# Patient Record
Sex: Male | Born: 1965 | Race: White | Hispanic: No | Marital: Married | Smoking: Never smoker
Health system: Southern US, Community
[De-identification: ages and names within clinical notes are randomized; demographics above are authoritative.]

## PROBLEM LIST (undated history)

## (undated) DIAGNOSIS — J189 Pneumonia, unspecified organism: Secondary | ICD-10-CM

## (undated) DIAGNOSIS — I2510 Atherosclerotic heart disease of native coronary artery without angina pectoris: Secondary | ICD-10-CM

## (undated) DIAGNOSIS — Z9889 Other specified postprocedural states: Secondary | ICD-10-CM

## (undated) DIAGNOSIS — Z9861 Coronary angioplasty status: Secondary | ICD-10-CM

## (undated) DIAGNOSIS — I252 Old myocardial infarction: Secondary | ICD-10-CM

## (undated) DIAGNOSIS — I219 Acute myocardial infarction, unspecified: Secondary | ICD-10-CM

## (undated) DIAGNOSIS — I10 Essential (primary) hypertension: Secondary | ICD-10-CM

## (undated) DIAGNOSIS — K219 Gastro-esophageal reflux disease without esophagitis: Secondary | ICD-10-CM

## (undated) DIAGNOSIS — E785 Hyperlipidemia, unspecified: Secondary | ICD-10-CM

## (undated) DIAGNOSIS — R112 Nausea with vomiting, unspecified: Secondary | ICD-10-CM

## (undated) DIAGNOSIS — M1990 Unspecified osteoarthritis, unspecified site: Secondary | ICD-10-CM

## (undated) DIAGNOSIS — I119 Hypertensive heart disease without heart failure: Secondary | ICD-10-CM

## (undated) HISTORY — DX: Atherosclerotic heart disease of native coronary artery without angina pectoris: I25.10

## (undated) HISTORY — DX: Coronary angioplasty status: Z98.61

## (undated) HISTORY — PX: BACK SURGERY: SHX140

## (undated) HISTORY — PX: KNEE ARTHROSCOPY: SUR90

## (undated) HISTORY — PX: ROTATOR CUFF REPAIR: SHX139

## (undated) HISTORY — PX: OTHER SURGICAL HISTORY: SHX169

## (undated) HISTORY — DX: Old myocardial infarction: I25.2

## (undated) HISTORY — PX: CARPAL TUNNEL RELEASE: SHX101

---

## 1999-04-01 ENCOUNTER — Encounter: Admission: RE | Admit: 1999-04-01 | Discharge: 1999-04-16 | Payer: Self-pay | Admitting: Family Medicine

## 2001-11-18 ENCOUNTER — Encounter: Payer: Self-pay | Admitting: Specialist

## 2001-11-18 ENCOUNTER — Ambulatory Visit (HOSPITAL_COMMUNITY): Admission: RE | Admit: 2001-11-18 | Discharge: 2001-11-18 | Payer: Self-pay | Admitting: Specialist

## 2001-12-05 ENCOUNTER — Encounter: Payer: Self-pay | Admitting: Emergency Medicine

## 2001-12-05 ENCOUNTER — Emergency Department (HOSPITAL_COMMUNITY): Admission: EM | Admit: 2001-12-05 | Discharge: 2001-12-05 | Payer: Self-pay | Admitting: Emergency Medicine

## 2001-12-10 ENCOUNTER — Inpatient Hospital Stay (HOSPITAL_COMMUNITY): Admission: RE | Admit: 2001-12-10 | Discharge: 2001-12-11 | Payer: Self-pay | Admitting: Specialist

## 2001-12-10 ENCOUNTER — Encounter (INDEPENDENT_AMBULATORY_CARE_PROVIDER_SITE_OTHER): Payer: Self-pay | Admitting: *Deleted

## 2003-03-09 ENCOUNTER — Encounter: Admission: RE | Admit: 2003-03-09 | Discharge: 2003-03-09 | Payer: Self-pay | Admitting: Specialist

## 2003-05-22 ENCOUNTER — Inpatient Hospital Stay (HOSPITAL_COMMUNITY): Admission: RE | Admit: 2003-05-22 | Discharge: 2003-05-25 | Payer: Self-pay | Admitting: Specialist

## 2003-12-21 ENCOUNTER — Ambulatory Visit (HOSPITAL_COMMUNITY): Admission: RE | Admit: 2003-12-21 | Discharge: 2003-12-21 | Payer: Self-pay | Admitting: Specialist

## 2005-06-17 ENCOUNTER — Ambulatory Visit: Payer: Self-pay | Admitting: Cardiology

## 2006-01-16 ENCOUNTER — Ambulatory Visit (HOSPITAL_COMMUNITY): Admission: RE | Admit: 2006-01-16 | Discharge: 2006-01-16 | Payer: Self-pay | Admitting: Specialist

## 2008-01-19 ENCOUNTER — Ambulatory Visit (HOSPITAL_COMMUNITY): Admission: RE | Admit: 2008-01-19 | Discharge: 2008-01-19 | Payer: Self-pay | Admitting: Specialist

## 2010-07-26 NOTE — Discharge Summary (Signed)
NAMESANAD, [name]                        ACCOUNT NO.:  [id number]   MEDICAL RECORD NO.:  [id number]                   PATIENT TYPE:  INP   LOCATION:  5034                                 FACILITY:  [facility]   PHYSICIAN:  [name], M.D.                 DATE OF BIRTH:  08-Dec-1965   DATE OF ADMISSION:  DATE OF DISCHARGE:  05/25/2003                                 DISCHARGE SUMMARY   ADMISSION DIAGNOSIS:  Degenerative disk disease L4-5.   DISCHARGE DIAGNOSES:  Degenerative disk disease L4-5, status post  decompression with posterior lumbar interbody fusion and transverse lumbar  interbody fusion at L4-5 level.   PROCEDURE:  The patient was taken to the operating room on May 22, 2003 to  undergo decompression at the L4-5 level with TLIF and PLIF using  instrumentation.  Surgeon Dr. [name], assistant Roma Schanz,  P.A.-C and Dr. Kerrin Champagne, anesthesia general.  No complications.   CONSULTATIONS:  PT/OT.   HISTORY:  [name] is a 45 year old gentleman with a long-standing history  of low back pain extending from work related injury. The patient initially  underwent bilateral hemilaminotomies as well as microdissection in October  2003. The patient did well for sometime, then had recurrence of his low back  pain.  He was initially treated conservatively, however, continued to note  significant discomfort in the lumbar spine area.  A discogram was ordered  which was positive at the L4-5 level.  Due to the patient's continued pain,  and the fact that conservative therapy has failed, it was felt he would  benefit from a 1-level lumbar fusion at the L4-5 level. The risks and  benefits of the surgery were discussed with the patient and he wished to  proceed.   LABORATORY DATA:  A preoperative EKG showed normal sinus rhythm.  I do not  see a preop chest x-ray.  Preoperative WBC 6.3, hemoglobin 15.5, hematocrit  45.5. Routine H&H were followed throughout the  hospital course. At time of  discharge, hemoglobin was 12.0, hematocrit 35.5.  Routine chemistries done  preoperatively showed sodium 141, potassium 4.4 with glucose 83.  Routine  chemistries were followed throughout the hospital course.  The patient did  have slight hyponatremia and at time of discharge, sodium was 133, potassium  3.8, glucose 112, slightly decreased BUN at 4. Blood type A positive.   HOSPITAL COURSE:  The patient was taken to the operating room and underwent  the above-stated procedure without difficulty. One Hemovac drain was placed  at the time of surgery.  Patient was placed on PCA analgesics for pain  relief.  He was then transferred to the PACU, then to the orthopedic floor  for continued postoperative care.  Postoperatively, he did very well. He  noted significant improvement in his lower extremities pain and low back  pain as expected.   He did have slight temperature on postoperative day #  1 and incentive  spirometer was encouraged.  He also had slight hyponatremia which was  treated with changing fluids.  PT/OT was consulted.  Diet was held until  flatus. On postoperative day #2, the patient did very well. He was voiding  without difficulty. Pain was controlled on p.o. analgesics.  He had good  bowel sounds, vital signs were stable, he was afebrile.  Hemovac drain was  removed, diet was advanced.  On postoperative day #3, the patient was doing  well, requesting discharge to home.  Abdomen remained soft, nontender,  nondistended, calves were soft without evidence of DVT.  Incision was clean  and dry. Motor and neurovascular function remained intact.  At this point,  it was felt the patient could be discharged home with all home health needs  met.   DISPOSITION:  The patient was discharged to home  with home health physical  therapy.   FOLLOW UP:  He should follow up with Dr. Shelle [name] in approximately 10-14 days,  call office for appointment.   WOUND CARE:  He  is to change his dressing on a daily basis, okay to shower.  Notify us if he notes any fever or erythema with excess drainage in this  area.   ACTIVITY:  He should follow back precautions as instructed by physical  therapy.  Walk as tolerated.   DISCHARGE MEDICATIONS:  Percocet as needed for pain relief, Robaxin as  needed for muscle spasm, Colace for stool softener.   CONDITION ON DISCHARGE:  Stable.  Patient doing well, status post  decompression at the L4-5 level with TLIF and PLIF with instrumentation.      Roma Schanz, P.A.                   [name], M.D.    CS/MEDQ  D:  06/23/2003  T:  06/24/2003  Job:  [id number]

## 2010-07-26 NOTE — Op Note (Signed)
NAMEJOHNELL, BAS                        ACCOUNT NO.:  [id number]   MEDICAL RECORD NO.:  [id number]                   PATIENT TYPE:  INP   LOCATION:  2899                                 FACILITY:  [facility]   PHYSICIAN:  [name], M.D.                 DATE OF BIRTH:  03/12/1965   DATE OF PROCEDURE:  05/22/2003  DATE OF DISCHARGE:                                 OPERATIVE REPORT   PREOPERATIVE DIAGNOSIS:  Recurrent disc herniation and degenerative disc  disease, L4-L5.   POSTOPERATIVE DIAGNOSIS:  Recurrent disc herniation and degenerative disc  disease, L4-L5.   PROCEDURE PERFORMED:  Hemilaminectomy L4-L5 on the right, with redo  microdiscectomy L4-L5, right, foraminotomies L4 and L5, TLIF L4-L5 utilizing  Monarch cage, autologous, and Symphony bone graft, pedicle screw  instrumentation L4-L5 with a lateral posterolateral fusion L4-L5 utilizing  autologous and Symphony bone graft, interoperative continuous neural  monitoring for four hours, interoperative EMG testing of the pedicle screw  placement.   ANESTHESIA:  General.   ASSISTANT:  Roma Schanz, P.A.  Kerrin Champagne, M.D.   BRIEF HISTORY AND INDICATIONS:  45 year old with end stage disc degeneration  at L4-L5 refractory to conservative treatment, positive diskography,  disabling pain.  Operative intervention was indication for decompression and  fusion.  The risks and benefits were discussed including bleeding,  infection, damage to neurovascular structures, CSF leak, epidural fibrosis,  adjacent segment disease, nonunion, etc.   SURGICAL TECHNIQUE:  With the patient in the supine position, after adequate  general anesthesia and 1 gram Kefzol, he was placed prone on the Wilson  frame upright in a flexed position.  The lumbar region was prepped and  draped in the usual sterile fashion.  The previous surgical incision was  utilized.  An incision was made from the spinous process of L3 to below L5.  The  subcutaneous tissue were dissected, electrocautery was utilized to  achieve hemostasis.  Kochers were placed in the spinous process of 3 and 4  and then 4 and 5 to confirm the disc space.  I then performed paraspinous  muscle elevation of 4 and 5 on the right.  A McCullough retractor was then  placed.  I utilizing a high speed bur to decorticate the bone and then an  osteotome to complete the Gill laminectomy for exposure for the TLIF.  We  then removed the remaining superior articular portion of the 4-5 facet to  flush with the pedicle.  I then decompressed medially using 2 and 3 mm  Kerrison removing the intervening ligamentum flavum creating a significant  decompression in the lateral recess and degenerative disc and posterior  osteophytic spurring.  After mobilization of the thecal sac and  identification of the 4 root which was protected at all times and  visualization with the microscope, we entered the disc space at the  annulotomy and dilated it to 10 mm.  This was  done under x-ray.  We then  curetted the endplates, performed a thorough and full discectomy using C-arm  for guidance.  After the full discectomy and curetting of the endplates, I  then felt that a 9 mm cage would be appropriate.  I then used bone graft  that was taken from the facet and from the Symphony bone graft.  I packed it  anteriorly in front of the vertebral bodies at 4-5 and selected a 9 mm cage,  placed the bone in the cage, and impacted it with neural elements protecting  it at all times, kicked it over with satisfactory AP and lateral plane with  distraction of the disc space.  A hockey stick probe was placed in the  foramen of 4 and 5 and found it widely patent.  Following this, we copiously  irrigated, bipolar electrocautery was utilized to achieve hemostasis.  I  closed the fascia in the midline temporarily.   I made a paraspinous incision through a separate fascial incision  bilaterally for the approach  of the pedicle screws and used the pedicle  screw instrumentation with the path finder technique.  We used the Jamshidi  needle engaging the TP and the lateral border of the pedicle.  With the  appropriate version using an x-ray, we impacted it into the vertebral body  at 4 and 5 using the identical technique making sure we did not go medial to  the medial border of the pedicle before we were into the vertebral body of  the lateral.  Following this, a guide-wire was placed.  Again, using  multiple C-arm images to insure that the guide-wire was not advanced.  After  the guide-wire, we then sequentially dilated and then used an awl in the  appropriate version.  The guide pin was there.  We selected 35 mm lag screws  at 5 and at 4.  We tapped and with the taps in each of the screw holes, we  tested the EMG monitoring and it was all greater than 23 milliamps.  We  inserted the screws first of all in the right at 4 and 5 in the appropriate  conversions.  Excellent purchase after tapping.  Then, in a similar fashion,  we inserted the screws on the right at appropriate conversion on AP and  appropriate orientation in the lateral.  We then used the path finder  technique and on the left, with the devices, moved the intervening muscle  between the two screws, measured the rod length 50 mm length, inserted that,  placed the end caps upon that on both sides.  We then compressed on the  right side over the cage under x-ray and placed the end caps, torqued them  off, and then we compressed on the other side and torqued them off, as well,  after placement of the end caps.  We had excellent purchase in the AP and  lateral plane, good length of the rods.  The wounds were copiously  irrigated.  I then examined it centrally and hockey stick probe was placed  in the foramina of 4 and 5 and found it widely patent.  The screws were in the pedicle.  There was no active bleeding.  Thrombin soaked Gelfoam was   placed in the laminotomy defect.  Copious irrigation was utilized.   Next, on the posterolateral fusion on the left, inserted the dilators down  of the transverse processes over the right, curetted the transverse  processes, and we had the  lateral aspect of the facet and then placed bone  graft through that and delivered it into the bed.  Next, the wound was  copiously irrigated and closed the separate fascial incisions with #1  Vicryl.  There was no significant abnormality on the continuous nerve  monitoring, as well.  We tested all screws prior to replacement of the rod  and they were all, again, greater than 23 milliamps.  Next, the wound was  copiously irrigated.  We placed a Hemovac and brought out through a lateral  stab wound in the skin in the subfascial area.  We repaired the central  subfascial with #1 Vicryl in figure-of-eight sutures, the subcutaneous  tissues were reapproximated with 0 and 2-0 Vicryl simple sutures, the skin  was reapproximated with staples.  The wounds were dressed sterilely, he was  placed supine on the operative bed, extubated without difficulty, and  transported to the recovery room in satisfactory condition.  The patient  tolerated the procedure well with no complications.  Blood loss 200 mL.                                               [name], M.D.    Cordelia Pen  D:  05/22/2003  T:  05/22/2003  Job:  [id number]

## 2010-07-26 NOTE — Op Note (Signed)
[name]                        ACCOUNT NO.:  [id number]   MEDICAL RECORD NO.:  [id number]                   PATIENT TYPE:  INP   LOCATION:  0469                                 FACILITY:  [facility]   PHYSICIAN:  Javier Docker, M.D.              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   PROCEDURE:  Bilateral hemilaminotomy, lateral recess decompression,  microdiskectomy.   ANESTHESIA:  General.   ASSISTANT:  Roma Schanz, P.A.   BRIEF HISTORY:  A 45 year old with bilateral refractory L5 radicular pain  secondary to HNP at L4-5, lateral recess stenosis, degenerative disk disease  at 4-5. Operative intervention was indicated for decompression of the 5 root  bilaterally, __________ microdiskectomy, lateral recess decompression. The  risks and benefits were discussed including bleeding, infection, injury to  neurovascular structures, CSF leakage, epidural fibrosis,  recurrence of  symptoms, etc.   TECHNIQUE:  The patient in the supine position after an adequate level of  general anesthesia and 1 gm of Kefzol, placed prone on the Andrews frame,  all bony prominences well padded, lumbar region was prepped and draped in  the usual sterile fashion. Two 18 gauge spinal needles were utilized to  localize the 4-5 interspace, confirmed with x-ray. An incision was made from  the spinous process of 4 to 5, subcutaneous tissue dissected, electrocautery  utilized to achieve hemostasis. The dorsal lumbar fascia identified and  divided along the outer line of the interspinous ligament. The paraspinous  muscle was elevated from the lamina of 4  and 5 bilaterally and McCullough  retractor was placed. Prior to this, Kochers placed in the spinous process  of 4, a Penfield 4 in the intralaminar space confirmed with x-ray. After the  [facility] retractor was placed, the operating microscope was draped and  brought onto the surgical field. The ligamentum flavum was detached from the  cephalad edge of 4 bilaterally. Hemilaminotomy was performed over the caudad  edge of 5. The neural patties were placed beneath the ligamentum flavum and  the ligamentum flavum was removed bilaterally. There was significant lateral  recess stenosis noted. Foraminotomies at L5 were performed. The L5 nerve  root was gently mobilized medially first on the left, decompressing the  lateral recess fully. There was extensive epidural venous plexus and bipolar  electrocautery was utilized to achieve hemostasis on the right. I gently  mobilized the L5 nerve root medially. There was significant tension on the  root noted secondary to disk herniation. After decompression of the lateral  recess had been performed, electrocautery was achieved with strict  hemostasis. Annulotomy was performed, a copious portion  of disk material was  removed from the disk space. There were multiple fragments noted in the  __________ disk space. I also entered the disk space on the left due to the  bilateral symptoms. All of the fragments were removed as well. I mobilized  across the midline with an Epstein and a hockey stick. I removed disk  material with straight and upbiting pituitary curettes. The disk space was  copiously irrigated and we inspected both sides with no evidence of residual  disk material noted. There was a prominent osteophytic spur at the disk  space of the vertebral body of 4 and of 5 somewhat prominent but after the  decompression there was no evidence of neural compression. The 5 roots were  freed bilaterally with over 1 cm of excursion medial to the pedicle. The   disk space was copiously irrigated, inspection revealed no CSF leakage or  active bleeding. McCullough retractor was removed, paraspinous muscles  inspected with no evidence of active bleeding. The dorsolumbar fascia was  reapproximated with #1 Vicryl interrupted figure-of-eight sutures. The  subcutaneous tissue reapproximated with 2-0 Vicryl simple sutures, skin was  reapproximated with 4-0 subcuticular Prolene and was reinforced with Steri-  Strips. A sterile dressing applied, placed supine on the hospital bed,  extubated without difficulty and transported to the recovery room in  satisfactory condition.   The patient tolerated the procedure well with no complications.                                               Javier Docker, M.D.    JCB/MEDQ  D:  12/10/2001  T:  12/10/2001  Job:  [id number]

## 2010-07-26 NOTE — H&P (Signed)
NAME:  [name], [name] NO.:  [id number]   MEDICAL RECORD NO.:  [id number]                    PATIENT TYPE:   LOCATION:                                       FACILITY:   PHYSICIAN:  Javier Docker, M.D.              DATE OF BIRTH:   DATE OF ADMISSION:  12/10/2001  DATE OF DISCHARGE:                                HISTORY & PHYSICAL   CHIEF COMPLAINT:  Low back pain, left buttock and posterior thigh pain.   HISTORY OF PRESENT ILLNESS:  This is a 45 year old male who, on July 05, 2001, was moving some machinery at work and pulled a muscle in his back.  He  had acute pain.  He was seen in the emergency room and then at [facility].  He was put on prednisone which gave him some  relief but now has persisted.  It shoots down his legs when he bends over  and radiates into his right buttock and legs.  It is worse with sitting,  coughing, and sneezing.  Lying down reduces the pain.  The patient denies  fevers, chills, change in bowel or bladder function, pain that awakens him  at night or unexplained recent weight loss.   PAST MEDICAL HISTORY:  The patient denies any past medical history.   PAST SURGICAL HISTORY:  Left knee scope.   MEDICATION:  Clonazepam 0.5 mg one p.o. q.h.s.   ALLERGIES:  No known drug allergies.   SOCIAL HISTORY:  The patient denies tobacco or alcohol use.  He lives in a  [place] house with no stairs.   FAMILY HISTORY:  Father with coronary artery disease, hypertension, and  diabetes mellitus.  Mother with hypertension and stroke.   REVIEW OF SYSTEMS:  GENERAL:  Denies fevers, chills, night sweats, bleeding  tendencies. CNS:  Positive headaches.  Denies blurry or double vision,  seizures, or paralysis.  RESPIRATORY:  Denies shortness of breath,  productive cough, hemoptysis.  CARDIOVASCULAR:  Denies chest pain, angina,  orthopnea.  GI:  Denies nausea, vomiting, diarrhea, constipation, melena, or  bloody stools.  GU:  Denies dysuria, hematuria or discharge.  MUSCULOSKELETAL:  Pertinent to HPI.   PHYSICAL EXAMINATION:  GENERAL APPEARANCE:  A well-developed, well-nourished  45 year old male.  VITAL SIGNS:  Blood pressure 134/84, pulse; 80, respiratory rate 168.  HEENT:  Normocephalic and atraumatic.  Pupils are equal, round and reactive  to light.  NECK:  Supple with no carotid bruit noted.  CHEST:  Clear to auscultation bilaterally.  No wheezes or crackles.  CARDIOVASCULAR:  Regular rate and rhythm with no murmurs, rubs, or gallops.  ABDOMEN:  Soft, nontender and nondistended with positive bowel sounds in all  four quadrants.  EXTREMITIES:  He walks with an antalgic gait, straight leg raising on the  right produces marked buttock, posterior thigh and calf pain.  EHL is 5/5  bilaterally.  Contralateral straight leg raising produces buttock pain.  He  is unable to heel walk on the left side.  He reports sensation is intact.  No flank pain with percussion.  Nontender over thoracic spine.  SKIN:  No rashes or lesions.   LABORATORY DATA:  MRI reveals L4-5 moderate disc degeneration with mild disc  space narrowing and adjacent end plate degenerative changes, moderate bulge  superimposed upon such a small, slightly moderate size posterolateral disc  protrusion with impression upon the right ventral aspect of the thecal sac.   IMPRESSION:  Herniated nucleus pulposus L4-5.   PLAN:  The patient will be admitted to [facility] on December 10, 2001, and undergo a bilateral lumbar decompression at L4-5 by Dr. [name].      Clarene Reamer, P.A.-C.                   Javier Docker, M.D.    SW/MEDQ  D:  12/07/2001  T:  12/07/2001  Job:  [id number]

## 2010-07-26 NOTE — H&P (Signed)
NAMEGUSTAVUS, [name]                        ACCOUNT NO.:  [id number]   MEDICAL RECORD NO.:  [id number]                   PATIENT TYPE:  INP   LOCATION:  NA                                   FACILITY:  [facility]   PHYSICIAN:  [name], M.D.                 DATE OF BIRTH:  May 18, 1965   DATE OF ADMISSION:  05/22/2003  DATE OF DISCHARGE:                                HISTORY & PHYSICAL   CHIEF COMPLAINT:  Low back and right lower extremity pain.   HISTORY:  Mr. [name] is a 45 year old gentleman who is a longstanding  patient of Dr. Shelle Iron.  He was initially seen for work-related low back  injury. The patient underwent bilateral hemilaminotomy as well as  microdiskectomy in October 2003.  The patient did well for quite some time;  however, he continued to complain of low back issues.  Initially  conservative treatment was initiated; however, he failed this.  A diskogram  was ordered which is positive at the L4-5 level.  The patient describes his  pain at this point as disabling.  It was felt he would benefit from a one-  level fusion at the L4-5 level.  The risks and benefits of the surgery were  discussed with him by Dr. Shelle Iron. At this point, he wishes to proceed.   PAST MEDICAL HISTORY:  Noncontributory.   CURRENT MEDICATIONS:  None.   ALLERGIES:  No known drug allergies.   PAST SURGICAL HISTORY:  1. Left knee arthroscopy.  2. Microdiskectomy.  3. Hemilaminotomy at L4-5 level.   SOCIAL HISTORY:  The patient is married.  He denies any tobacco or alcohol  intake.  He has three children and lives in a single-story home.   FAMILY HISTORY:  Father has history of coronary artery disease and diabetes,  mother history of CVA.   REVIEW OF SYSTEMS:  GENERAL:  The patient denies any fevers, chills, night  sweats, or bleeding tendencies.  CNS:  No blurred or double vision, seizure,  headache, or paralysis.  RESPIRATORY:  No shortness of breath, productive  cough, or hemoptysis.   CARDIOVASCULAR:  No chest pain, angina, or orthopnea.  GU:  No dysuria, hematuria, or discharge.  GI:  No nausea, vomiting,  diarrhea, constipation, melena, bloody stools.  MUSCULOSKELETAL:  Pertinent  as described.   PHYSICAL EXAMINATION:  VITAL SIGNS:  Pulse 76, respiratory rate 16, blood  pressure 140/98.  GENERAL:  Well-developed, well-nourished, 45 year old gentleman in mild  distress.  He does walk with slightly antalgic gait.  HEENT:  Atraumatic and normocephalic.  Pupils equal, round, and reactive to  light.  EOM intact.  NECK:  Supple.  No lymphadenopathy.  CHEST:  Clear to auscultation bilaterally.  No rhonchi, wheezes, or rales.  BREASTS/GU:  Not examined, no pertinent.  HEART:  Regular rate and rhythm without murmurs, gallops, or rubs.  ABDOMEN:  Soft, nontender, nondistended.  Bowel sounds x 4.  SKIN:  No rashes or lesions are noted.  MUSCULOSKELETAL:  Significantly decreased range of motion of the lumbar  spine.  Positive straight leg raise bilaterally.   IMPRESSION:  Degenerative disk disease L4-5.   PLAN:  The patient will be admitted to [facility] to undergo PLIF with  posterolateral fusion using pedicle screw instrumentation at the L4-5 level      Roma Schanz, P.A.                   [name], M.D.    CS/MEDQ  D:  05/18/2003  T:  05/19/2003  Job:  [id number]

## 2015-07-27 ENCOUNTER — Ambulatory Visit (HOSPITAL_COMMUNITY)
Admission: RE | Admit: 2015-07-27 | Discharge: 2015-07-27 | Disposition: A | Discharge status: Home / Self Care | Payer: Self-pay | Source: Ambulatory Visit | Attending: Specialist | Admitting: Specialist

## 2015-07-27 ENCOUNTER — Other Ambulatory Visit (HOSPITAL_COMMUNITY): Payer: Self-pay | Admitting: Specialist

## 2015-07-27 DIAGNOSIS — Z01810 Encounter for preprocedural cardiovascular examination: Secondary | ICD-10-CM

## 2015-07-27 DIAGNOSIS — Z01818 Encounter for other preprocedural examination: Secondary | ICD-10-CM | POA: Insufficient documentation

## 2015-09-16 ENCOUNTER — Inpatient Hospital Stay (HOSPITAL_COMMUNITY)
Admission: EM | Admit: 2015-09-16 | Discharge: 2015-09-18 | DRG: 247 | Disposition: A | Discharge status: Home / Self Care | Payer: BLUE CROSS/BLUE SHIELD | Attending: Cardiology | Admitting: Cardiology

## 2015-09-16 ENCOUNTER — Emergency Department (HOSPITAL_COMMUNITY): Payer: BLUE CROSS/BLUE SHIELD

## 2015-09-16 ENCOUNTER — Encounter (HOSPITAL_COMMUNITY): Payer: Self-pay | Admitting: Emergency Medicine

## 2015-09-16 DIAGNOSIS — E785 Hyperlipidemia, unspecified: Secondary | ICD-10-CM

## 2015-09-16 DIAGNOSIS — I214 Non-ST elevation (NSTEMI) myocardial infarction: Secondary | ICD-10-CM | POA: Diagnosis present

## 2015-09-16 DIAGNOSIS — K219 Gastro-esophageal reflux disease without esophagitis: Secondary | ICD-10-CM | POA: Diagnosis present

## 2015-09-16 DIAGNOSIS — Z955 Presence of coronary angioplasty implant and graft: Secondary | ICD-10-CM | POA: Diagnosis not present

## 2015-09-16 DIAGNOSIS — I200 Unstable angina: Secondary | ICD-10-CM | POA: Diagnosis present

## 2015-09-16 DIAGNOSIS — R079 Chest pain, unspecified: Secondary | ICD-10-CM | POA: Diagnosis present

## 2015-09-16 DIAGNOSIS — I25110 Atherosclerotic heart disease of native coronary artery with unstable angina pectoris: Secondary | ICD-10-CM | POA: Diagnosis present

## 2015-09-16 DIAGNOSIS — I119 Hypertensive heart disease without heart failure: Secondary | ICD-10-CM

## 2015-09-16 HISTORY — DX: Hypertensive heart disease without heart failure: I11.9

## 2015-09-16 HISTORY — DX: Essential (primary) hypertension: I10

## 2015-09-16 HISTORY — DX: Hyperlipidemia, unspecified: E78.5

## 2015-09-16 HISTORY — DX: Gastro-esophageal reflux disease without esophagitis: K21.9

## 2015-09-16 LAB — BASIC METABOLIC PANEL
Anion gap: 6 (ref 5–15)
BUN: 16 mg/dL (ref 6–20)
CO2: 27 mmol/L (ref 22–32)
Calcium: 9.2 mg/dL (ref 8.9–10.3)
Chloride: 108 mmol/L (ref 101–111)
Creatinine, Ser: 1.02 mg/dL (ref 0.61–1.24)
GFR calc Af Amer: >60 mL/min (ref >60)
GFR calc non Af Amer: >60 mL/min (ref >60)
Glucose, Bld: 106 mg/dL — ABNORMAL HIGH (ref 65–99)
Potassium: 3.8 mmol/L (ref 3.5–5.1)
Sodium: 141 mmol/L (ref 135–145)

## 2015-09-16 LAB — TROPONIN I
TROPONIN I: 0.98 ng/mL — AB (ref <0.03)
Troponin I: 0.03 ng/mL (ref <0.03)

## 2015-09-16 LAB — CBC
HCT: 43 % (ref 39.0–52.0)
Hemoglobin: 14.6 g/dL (ref 13.0–17.0)
MCH: 29.4 pg (ref 26.0–34.0)
MCHC: 34 g/dL (ref 30.0–36.0)
MCV: 86.7 fL (ref 78.0–100.0)
Platelets: 276 10*3/uL (ref 150–400)
RBC: 4.96 MIL/uL (ref 4.22–5.81)
RDW: 13 % (ref 11.5–15.5)
WBC: 6.6 10*3/uL (ref 4.0–10.5)

## 2015-09-16 MED ORDER — ATORVASTATIN CALCIUM 80 MG PO TABS: 80.0000 mg | ORAL_TABLET | Freq: Every day | ORAL | Status: DC

## 2015-09-16 MED ORDER — ACETAMINOPHEN 325 MG PO TABS
650.0000 mg | ORAL_TABLET | ORAL | Status: DC | PRN
  Administered 2015-09-16 – 2015-09-17 (×2): 650 mg via ORAL
  Filled 2015-09-16 (×2): qty 2

## 2015-09-16 MED ORDER — METOPROLOL TARTRATE 12.5 MG HALF TABLET
12.5000 mg | ORAL_TABLET | Freq: Two times a day (BID) | ORAL | Status: DC
  Administered 2015-09-17 – 2015-09-18 (×3): 12.5 mg via ORAL
  Filled 2015-09-16 (×3): qty 1

## 2015-09-16 MED ORDER — NITROGLYCERIN 2 % TD OINT
1.0000 [in_us] | TOPICAL_OINTMENT | Freq: Once | TRANSDERMAL | Status: AC
  Administered 2015-09-16: 1 [in_us] via TOPICAL
  Filled 2015-09-16: qty 1

## 2015-09-16 MED ORDER — CLOPIDOGREL BISULFATE 75 MG PO TABS
300.0000 mg | ORAL_TABLET | Freq: Once | ORAL | Status: AC
  Administered 2015-09-16: 300 mg via ORAL
  Filled 2015-09-16: qty 4

## 2015-09-16 MED ORDER — ONDANSETRON HCL 4 MG/2ML IJ SOLN: 4.0000 mg | Freq: Four times a day (QID) | INTRAMUSCULAR | Status: DC | PRN

## 2015-09-16 MED ORDER — NITROGLYCERIN 0.4 MG SL SUBL
0.4000 mg | SUBLINGUAL_TABLET | SUBLINGUAL | Status: AC | PRN
  Administered 2015-09-16 (×3): 0.4 mg via SUBLINGUAL
  Filled 2015-09-16: qty 1

## 2015-09-16 MED ORDER — NITROGLYCERIN 2 % TD OINT
1.0000 [in_us] | TOPICAL_OINTMENT | Freq: Three times a day (TID) | TRANSDERMAL | Status: DC
  Filled 2015-09-16: qty 30

## 2015-09-16 MED ORDER — NITROGLYCERIN 0.4 MG SL SUBL
0.4000 mg | SUBLINGUAL_TABLET | SUBLINGUAL | Status: DC | PRN
Start: 2015-09-16 — End: 2015-09-18

## 2015-09-16 MED ORDER — CLOPIDOGREL BISULFATE 75 MG PO TABS
75.0000 mg | ORAL_TABLET | Freq: Every day | ORAL | Status: DC
  Administered 2015-09-17: 75 mg via ORAL
  Filled 2015-09-16: qty 1

## 2015-09-16 MED ORDER — ASPIRIN 81 MG PO CHEW
324.0000 mg | CHEWABLE_TABLET | Freq: Once | ORAL | Status: AC
Start: 2015-09-16 — End: 2015-09-16
  Administered 2015-09-16: 324 mg via ORAL
  Filled 2015-09-16: qty 4

## 2015-09-16 MED ORDER — HEPARIN BOLUS VIA INFUSION
4000.0000 [IU] | Freq: Once | INTRAVENOUS | Status: AC
  Administered 2015-09-16: 4000 [IU] via INTRAVENOUS
  Filled 2015-09-16: qty 4000

## 2015-09-16 MED ORDER — ASPIRIN EC 81 MG PO TBEC
81.0000 mg | DELAYED_RELEASE_TABLET | Freq: Every day | ORAL | Status: DC
  Administered 2015-09-17 – 2015-09-18 (×2): 81 mg via ORAL
  Filled 2015-09-16 (×2): qty 1

## 2015-09-16 MED ORDER — HEPARIN (PORCINE) IN NACL 100-0.45 UNIT/ML-% IJ SOLN
1000.0000 [IU]/h | INTRAMUSCULAR | Status: DC
  Administered 2015-09-16: 1000 [IU]/h via INTRAVENOUS
  Filled 2015-09-16: qty 250

## 2015-09-16 MED ORDER — PANTOPRAZOLE SODIUM 40 MG PO TBEC
40.0000 mg | DELAYED_RELEASE_TABLET | Freq: Every day | ORAL | Status: DC
  Administered 2015-09-17 – 2015-09-18 (×2): 40 mg via ORAL
  Filled 2015-09-16 (×2): qty 1

## 2015-09-16 NOTE — ED Provider Notes (Signed)
CSN: [id number]     Arrival date & time 09/16/15  1634 History   First MD Initiated Contact with Patient 09/16/15 1823     Chief Complaint  Patient presents with  . Chest Pain    Patient is a 50 y.o. male presenting with chest pain. The history is provided by the patient.  Chest Pain Pain location:  Substernal area Pain quality: tightness   Radiates to: shoulder and back. Pain radiates to the back: yes   Pain severity:  Moderate Onset quality:  Gradual Duration:  2 days Timing:  Intermittent Progression:  Improving Chronicity:  New Relieved by:  Rest Worsened by:  Exertion Associated symptoms: dizziness, nausea and shortness of breath   Associated symptoms: no abdominal pain, no fever and no lower extremity edema   Associated symptoms comment:  Chronic back pain  Risk factors: high cholesterol, hypertension and male sex   Risk factors: no prior DVT/PE and no smoking   Patient reports for past 2 days he has had intermittent episodes of chest tightness Today while shopping it was worse with exertion and improved with rest He reports associated dizziness/shortness of breath No known h/o CAD   fam hx - positive for CAD for multiple family members Past Medical History  Diagnosis Date  . Hypertension   . GERD (gastroesophageal reflux disease)    Past Surgical History  Procedure Laterality Date  . Back surgery    . Rotator cuff repair Right    Family History  Problem Relation Age of Onset  . Heart attack Father    Social History  Substance Use Topics  . Smoking status: None  . Smokeless tobacco: Never Used  . Alcohol Use: No    Review of Systems  Constitutional: Negative for fever.  Respiratory: Positive for shortness of breath.   Cardiovascular: Positive for chest pain.  Gastrointestinal: Positive for nausea. Negative for abdominal pain and blood in stool.  Neurological: Positive for dizziness.  All other systems reviewed and are negative.     Allergies   Review of patient's allergies indicates no known allergies.  Home Medications   Prior to Admission medications   Not on File   BP 160/99 mmHg  Pulse 87  Temp(Src) 98.5 F (36.9 C) (Oral)  Resp 14  Ht 5\' 7"  (1.702 m)  Wt 81.647 kg  BMI 28.19 kg/m2  SpO2 100% Physical Exam CONSTITUTIONAL: Well developed/well nourished HEAD: Normocephalic/atraumatic EYES: EOMI ENMT: Mucous membranes moist NECK: supple no meningeal signs SPINE/BACK:entire spine nontender CV: S1/S2 noted, no murmurs/rubs/gallops noted LUNGS: Lungs are clear to auscultation bilaterally, no apparent distress ABDOMEN: soft, nontender, no rebound or guarding, bowel sounds noted throughout abdomen GU:no cva tenderness NEURO: Pt is awake/alert/appropriate, moves all extremitiesx4.  No facial droop.   EXTREMITIES: pulses normal/equalx4, full ROM, no LE edema/tenderness SKIN: warm, color normal PSYCH: no abnormalities of mood noted, alert and oriented to situation  ED Course  Procedures   CRITICAL CARE Performed by: Joya GaskinsWICKLINE,Channelle Bottger W Total critical care time: 33 minutes Critical care time was exclusive of separately billable procedures and treating other patients. Critical care was necessary to treat or prevent imminent or life-threatening deterioration. Critical care was time spent personally by me on the following activities: development of treatment plan with patient and/or surrogate as well as nursing, discussions with consultants, evaluation of patient's response to treatment, examination of patient, obtaining history from patient or surrogate, ordering and performing treatments and interventions, ordering and review of laboratory studies, ordering and review of radiographic studies,  pulse oximetry and re-evaluation of patient's condition. PATIENT NON-STEMI/ELEVATED TROPONIN WITH CHEST PAIN REQUIRING TRANSFER/ADMISSION TO CARDIAC CENTER  Medications  nitroGLYCERIN (NITROSTAT) SL tablet 0.4 mg (0.4 mg Sublingual  Given 09/16/15 1854)  aspirin chewable tablet 324 mg (324 mg Oral Given 09/16/15 1854)    Labs Review Labs Reviewed  BASIC METABOLIC PANEL - Abnormal; Notable for the following:    Glucose, Bld 106 (*)    All other components within normal limits  TROPONIN I - Abnormal; Notable for the following:    Troponin I 0.03 (*)    All other components within normal limits  CBC    Imaging Review Dg Chest 2 View  09/16/2015  CLINICAL DATA:  Acute onset of mid to right-sided chest pain and left shoulder and arm pain. Initial encounter. EXAM: CHEST  2 VIEW COMPARISON:  None. FINDINGS: The lungs are well-aerated. Pulmonary vascularity is at the upper limits of normal. There is no evidence of focal opacification, pleural effusion or pneumothorax. The heart is normal in size; the mediastinal contour is within normal limits. No acute osseous abnormalities are seen. IMPRESSION: No acute cardiopulmonary process seen. Electronically Signed   By: Roanna Raider M.D.   On: 09/16/2015 17:05   I have personally reviewed and evaluated these images and lab results as part of my medical decision-making.   EKG Interpretation   Date/Time:  Sunday September 16 2015 16:40:51 EDT Ventricular Rate:  94 PR Interval:  146 QRS Duration: 78 QT Interval:  344 QTC Calculation: 430 R Axis:   40 Text Interpretation:  Normal sinus rhythm Normal ECG Confirmed by Bebe Shaggy   MD, Feiga Nadel ([id number]) on 09/16/2015 6:24:51 PM     7:56 PM  PT WITH CONCERNING H/O CHEST PAIN, CONCERN FOR UNSTABLE ANGINA TROPONIN BORDERLINE ELEVATED PT IS NOW NEARLY CHEST PAIN FREE AFTER ASA/NTG REPEAT EKG PERFORMED AND UNCHANGED   EKG Interpretation  Date/Time:  Sunday September 16 2015 19:11:26 EDT Ventricular Rate:  84 PR Interval:  146 QRS Duration: 85 QT Interval:  346 QTC Calculation: 409 R Axis:   3 Text Interpretation:  Sinus rhythm Normal ECG No significant change since last tracing Confirmed by Bebe Shaggy  MD, Gibran Veselka ([id number]) on 09/16/2015 7:18:53  PM      D/W DR Tarri Glenn WITH CARDIOLOGY AT [facility] WILL ACCEPT IN TRANSFER TO TELE UNIT PT/FAMILY UPDATED ON PLAN  MDM   Final diagnoses:  Unstable angina (HCC)    Nursing notes including past medical history and social history reviewed and considered in documentation Labs/vital reviewed myself and considered during evaluation xrays/imaging reviewed by myself and considered during evaluation     Zadie Rhine, MD 09/16/15 1958

## 2015-09-16 NOTE — ED Notes (Signed)
CRITICAL VALUE ALERT  Critical value received:  Troponin 0.98  Date of notification:  09/16/15  Time of notification:  2210  Critical value read back:Yes.    Nurse who received alert:  Thornton Daleson L Cuauhtemoc Huegel, RN  MD notified (1st page):  Cardiology  Time of first page:  2211  MD notified (2nd page):  Time of second page:  Responding MD:  Lequita HaltMorgan RN, to speak to cardiologist  Time MD responded:  2213

## 2015-09-16 NOTE — ED Notes (Signed)
Patient c/o chest pain that started as back and shoulder pain on Friday and has now radiated into central chest/prominent on left side. Per patient shortness of breath, diaphoresis, and dizziness. Denies any cardiac hx. Per patient started while working at the computer. Per patient pain worse with exertion.

## 2015-09-16 NOTE — ED Notes (Signed)
Called Morgan RN & advised of new troponin result. She stated cardiologist has been paged.

## 2015-09-16 NOTE — ED Notes (Signed)
Pt ambulated to restroom & returned. Upon arriving back in room pt states slight increase in pressure to his chest, but still better than when he came to the ER.

## 2015-09-16 NOTE — Progress Notes (Signed)
ANTICOAGULATION CONSULT NOTE - Initial Consult  Pharmacy Consult for Heparin Indication: chest pain/ACS  No Known Allergies  Patient Measurements: Height: 5\' 7"  (170.2 cm) Weight: 180 lb (81.647 kg) IBW/kg (Calculated) : 66.1  Vital Signs: Temp: 97.3 F (36.3 C) (07/09 2221) Temp Source: Oral (07/09 2221) BP: 123/56 mmHg (07/09 2221) Pulse Rate: 78 (07/09 2221)  Labs:  Recent Labs  09/16/15 1706 09/16/15 2056  HGB 14.6  --   HCT 43.0  --   PLT 276  --   CREATININE 1.02  --   TROPONINI 0.03* 0.98*    Estimated Creatinine Clearance: 86.6 mL/min (by C-G formula based on Cr of 1.02).   Medical History: Past Medical History  Diagnosis Date  . Hypertension   . GERD (gastroesophageal reflux disease)     Medications:  Prescriptions prior to admission  Medication Sig Dispense Refill Last Dose  . aspirin EC 81 MG tablet Take 81 mg by mouth once as needed for mild pain or moderate pain.   09/16/2015 at Unknown time  . HYDROcodone-acetaminophen (NORCO) 7.5-325 MG tablet TAKE 1 TABLET BY MOUTH EVERY EIGHT HOURS AS NEEDED FOR PAIN  0 Past Week at Unknown time  . lansoprazole (PREVACID) 30 MG capsule Take 30 mg by mouth daily.  12 09/16/2015 at Unknown time  . lisinopril (PRINIVIL,ZESTRIL) 10 MG tablet Take 10 mg by mouth daily.   09/16/2015 at Unknown time  . Multiple Vitamin (MULTIVITAMIN WITH MINERALS) TABS tablet Take 1 tablet by mouth daily.   09/16/2015 at Unknown time  . [DISCONTINUED] meloxicam (MOBIC) 15 MG tablet Take 15 mg by mouth daily as needed. For pain  1 unknown    Assessment: 50 y.o. male with chest pain/NSTEMI for heparin  Goal of Therapy:  Heparin level 0.3-0.7 units/ml Monitor platelets by anticoagulation protocol: Yes   Plan:  Heparin 4000 units IV bolus, then start heparin 1000 units/hr Follow-up am labs.   Eddie Candlebbott, [place] Perra Hellums 09/16/2015,11:22 PM

## 2015-09-16 NOTE — ED Notes (Signed)
Pt states pain has reduced since returning to the room & applying nitro patch.

## 2015-09-16 NOTE — H&P (Signed)
CC: Chest discomfort  HPI: 89 man, nonsmoker, hx of HTN and back pain, presented to [facility] ER with chest discomfort. Reports having chest tightness at rest two days ago lasting 30 minutes, associated with "cold seats" and SOB. No syncope, palpitations. Did not feel well all day afterwards. Admits having even shorter and milder episodes in the past few days but did not pay much attention. This afternoon, had the most severe episode of sustained mid-sternal chest pressure associated with diaphoresis and SOB.   Received ASA 324 mg po x1 and one inch NTG paster at the outside ER with improvement in his symptoms. No syncope. No known CAD, prior MI, CVA, bleeding, stress testing or other cardiac evaluation in the past.    Review of Systems:  10 systems reviewed unremarkable except as noted in HPI     Past Medical History  Diagnosis Date  . Hypertension   . GERD (gastroesophageal reflux disease)     No current facility-administered medications on file prior to encounter.   No current outpatient prescriptions on file prior to encounter.   Home Meds:  Lisinopril ASA 81 mg as needed Prevacid   No Known Allergies  Social History   Social History  . Marital Status: Married    Spouse Name: N/A  . Number of Children: N/A  . Years of Education: N/A   Occupational History  . Not on file.   Social History Main Topics  . Smoking status: Not on file  . Smokeless tobacco: Never Used  . Alcohol Use: No  . Drug Use: No  . Sexual Activity: Not on file   Other Topics Concern  . Not on file   Social History Narrative  . No narrative on file    Family History  Problem Relation Age of Onset  . Heart attack Father     PHYSICAL EXAM: Filed Vitals:   09/16/15 2100 09/16/15 2221  BP: 139/81 123/56  Pulse: 69 78  Temp:  97.3 F (36.3 C)  Resp: 16    General:  Well appearing. No respiratory difficulty HEENT: normal Neck: supple. no JVD. Carotids 2+ bilat; no bruits.  No lymphadenopathy or thryomegaly appreciated. Cor: PMI nondisplaced. Regular rate & rhythm. No rubs, gallops or murmurs. Lungs: clear Abdomen: soft, nontender, nondistended. No hepatosplenomegaly. No bruits or masses. Good bowel sounds. Extremities: no cyanosis, clubbing, rash, edema Neuro: alert & oriented x 3, cranial nerves grossly intact. moves all 4 extremities w/o difficulty. Affect pleasant.  ECG: NSR, no acute St/T changes  Results for orders placed or performed during the hospital encounter of 09/16/15 (from the past 24 hour(s))  Basic metabolic panel     Status: Abnormal   Collection Time: 09/16/15  5:06 PM  Result Value Ref Range   Sodium 141 135 - 145 mmol/L   Potassium 3.8 3.5 - 5.1 mmol/L   Chloride 108 101 - 111 mmol/L   CO2 27 22 - 32 mmol/L   Glucose, Bld 106 (H) 65 - 99 mg/dL   BUN 16 6 - 20 mg/dL   Creatinine, Ser 0.861.02 0.61 - 1.24 mg/dL   Calcium 9.2 8.9 - 57.810.3 mg/dL   GFR calc non Af Amer >60 >60 mL/min   GFR calc Af Amer >60 >60 mL/min   Anion gap 6 5 - 15  CBC     Status: None   Collection Time: 09/16/15  5:06 PM  Result Value Ref Range   WBC 6.6 4.0 - 10.5 K/uL   RBC  4.96 4.22 - 5.81 MIL/uL   Hemoglobin 14.6 13.0 - 17.0 g/dL   HCT 40.9 81.1 - 91.4 %   MCV 86.7 78.0 - 100.0 fL   MCH 29.4 26.0 - 34.0 pg   MCHC 34.0 30.0 - 36.0 g/dL   RDW 78.2 95.6 - 21.3 %   Platelets 276 150 - 400 K/uL  Troponin I     Status: Abnormal   Collection Time: 09/16/15  5:06 PM  Result Value Ref Range   Troponin I 0.03 (HH) <0.03 ng/mL  Troponin I     Status: Abnormal   Collection Time: 09/16/15  8:56 PM  Result Value Ref Range   Troponin I 0.98 (HH) <0.03 ng/mL   Dg Chest 2 View  09/16/2015  CLINICAL DATA:  Acute onset of mid to right-sided chest pain and left shoulder and arm pain. Initial encounter. EXAM: CHEST  2 VIEW COMPARISON:  None. FINDINGS: The lungs are well-aerated. Pulmonary vascularity is at the upper limits of normal. There is no evidence of focal  opacification, pleural effusion or pneumothorax. The heart is normal in size; the mediastinal contour is within normal limits. No acute osseous abnormalities are seen. IMPRESSION: No acute cardiopulmonary process seen. Electronically Signed   By: Roanna Raider M.D.   On: 09/16/2015 17:05     ASSESSMENT:  1. NSTEMI - Concerning story for ACS and abnormal Trop 0.03-->0.98 - No evidence of acute HF or cardiogenic shock - Minimal chest discomfort at present time - Received ASA 324 mg po x 1 and NTG paste 1 inch at [facility] with significant improvement of his symptoms   PLAN/DISCUSSION:  Admit to cardiology  ASAS 81 mg po qd from tomorrow Plavix 300 mg po x 1 now and then 75 mg po qd from tomorrow  Metoprolol 12.5 mg po bid - will up-titrate if possible  High intensity statin Heparin infusion Echo  Check lipids and HbA1c  Plan for heart cath in am. Discussed risks, benefits and alternatives. Questions answered. He wishes to proceed with this plan.    Nevin Bloodgood, MD

## 2015-09-17 ENCOUNTER — Encounter (HOSPITAL_COMMUNITY)
Admission: EM | Disposition: A | Discharge status: Home / Self Care | Payer: Self-pay | Source: Home / Self Care | Attending: Cardiology

## 2015-09-17 ENCOUNTER — Encounter (HOSPITAL_COMMUNITY): Payer: Self-pay

## 2015-09-17 ENCOUNTER — Inpatient Hospital Stay (HOSPITAL_COMMUNITY): Payer: BLUE CROSS/BLUE SHIELD

## 2015-09-17 DIAGNOSIS — I25110 Atherosclerotic heart disease of native coronary artery with unstable angina pectoris: Secondary | ICD-10-CM

## 2015-09-17 DIAGNOSIS — I119 Hypertensive heart disease without heart failure: Secondary | ICD-10-CM

## 2015-09-17 DIAGNOSIS — E785 Hyperlipidemia, unspecified: Secondary | ICD-10-CM

## 2015-09-17 DIAGNOSIS — R079 Chest pain, unspecified: Secondary | ICD-10-CM

## 2015-09-17 HISTORY — PX: CARDIAC CATHETERIZATION: SHX172

## 2015-09-17 HISTORY — DX: Hyperlipidemia, unspecified: E78.5

## 2015-09-17 HISTORY — DX: Hypertensive heart disease without heart failure: I11.9

## 2015-09-17 HISTORY — PX: TRANSTHORACIC ECHOCARDIOGRAM: SHX275

## 2015-09-17 LAB — TROPONIN I
TROPONIN I: 3.14 ng/mL — AB (ref <0.03)
TROPONIN I: 8.32 ng/mL — AB (ref <0.03)
TROPONIN I: 8.54 ng/mL — AB (ref <0.03)

## 2015-09-17 LAB — ECHOCARDIOGRAM COMPLETE
CHL CUP MV DEC (S): 169
E decel time: 169 msec
EERAT: 10
FS: 23 % — AB (ref 28–44)
HEIGHTINCHES: 67 in
IVS/LV PW RATIO, ED: 1.15
LA diam end sys: 32 mm
LA vol A4C: 57.5 ml
LA vol: 59 mL
LADIAMINDEX: 1.66 cm/m2
LASIZE: 32 mm
LAVOLIN: 30.6 mL/m2
LV E/e' medial: 10
LV TDI E'LATERAL: 10.6
LV e' LATERAL: 10.6 cm/s
LVEEAVG: 10
LVOT area: 3.8 cm2
LVOTD: 22 mm
MV Peak grad: 4 mmHg
MV pk A vel: 76.4 m/s
MVPKEVEL: 106 m/s
PW: 7.86 mm — AB (ref 0.6–1.1)
RV LATERAL S' VELOCITY: 12.4 cm/s
TAPSE: 22.2 mm
TDI e' medial: 9.79
WEIGHTICAEL: 2870.4 [oz_av]

## 2015-09-17 LAB — CBC
HEMATOCRIT: 41.8 % (ref 39.0–52.0)
Hemoglobin: 13.9 g/dL (ref 13.0–17.0)
MCH: 29.1 pg (ref 26.0–34.0)
MCHC: 33.3 g/dL (ref 30.0–36.0)
MCV: 87.6 fL (ref 78.0–100.0)
Platelets: 269 10*3/uL (ref 150–400)
RBC: 4.77 MIL/uL (ref 4.22–5.81)
RDW: 13.2 % (ref 11.5–15.5)
WBC: 10.4 10*3/uL (ref 4.0–10.5)

## 2015-09-17 LAB — MAGNESIUM: MAGNESIUM: 2.3 mg/dL (ref 1.7–2.4)

## 2015-09-17 LAB — LIPID PANEL
Cholesterol: 217 mg/dL — ABNORMAL HIGH (ref 0–200)
HDL: 45 mg/dL (ref >40)
LDL CALC: 139 mg/dL — AB (ref 0–99)
TRIGLYCERIDES: 164 mg/dL — AB (ref <150)
Total CHOL/HDL Ratio: 4.8 RATIO
VLDL: 33 mg/dL (ref 0–40)

## 2015-09-17 LAB — HEPARIN LEVEL (UNFRACTIONATED)
Heparin Unfractionated: 0.5 IU/mL (ref 0.30–0.70)
Heparin Unfractionated: 0.37 IU/mL (ref 0.30–0.70)

## 2015-09-17 LAB — HEPATIC FUNCTION PANEL
ALBUMIN: 4.2 g/dL (ref 3.5–5.0)
ALT: 33 U/L (ref 17–63)
AST: 48 U/L — ABNORMAL HIGH (ref 15–41)
Alkaline Phosphatase: 67 U/L (ref 38–126)
BILIRUBIN TOTAL: 0.3 mg/dL (ref 0.3–1.2)
Bilirubin, Direct: <0.1 mg/dL — ABNORMAL LOW (ref 0.1–0.5)
TOTAL PROTEIN: 6.9 g/dL (ref 6.5–8.1)

## 2015-09-17 LAB — POCT ACTIVATED CLOTTING TIME: ACTIVATED CLOTTING TIME: 577 s

## 2015-09-17 LAB — APTT: aPTT: 30 seconds (ref 24–37)

## 2015-09-17 LAB — PROTIME-INR
INR: 1.05 (ref 0.00–1.49)
PROTHROMBIN TIME: 13.9 s (ref 11.6–15.2)

## 2015-09-17 LAB — BRAIN NATRIURETIC PEPTIDE: B NATRIURETIC PEPTIDE 5: 23.2 pg/mL (ref 0.0–100.0)

## 2015-09-17 SURGERY — LEFT HEART CATH AND CORONARY ANGIOGRAPHY

## 2015-09-17 MED ORDER — BIVALIRUDIN 250 MG IV SOLR
INTRAVENOUS | Status: AC
  Filled 2015-09-17: qty 250

## 2015-09-17 MED ORDER — HEPARIN SODIUM (PORCINE) 1000 UNIT/ML IJ SOLN
INTRAMUSCULAR | Status: AC
  Filled 2015-09-17: qty 1

## 2015-09-17 MED ORDER — FENTANYL CITRATE (PF) 100 MCG/2ML IJ SOLN
INTRAMUSCULAR | Status: AC
  Filled 2015-09-17: qty 2

## 2015-09-17 MED ORDER — HEPARIN (PORCINE) IN NACL 2-0.9 UNIT/ML-% IJ SOLN
INTRAMUSCULAR | Status: DC | PRN
  Administered 2015-09-17: 17:00:00

## 2015-09-17 MED ORDER — SODIUM CHLORIDE 0.9 % IV SOLN: 250.0000 mL | INTRAVENOUS | Status: DC | PRN

## 2015-09-17 MED ORDER — ASPIRIN 81 MG PO CHEW: 81.0000 mg | CHEWABLE_TABLET | ORAL | Status: AC

## 2015-09-17 MED ORDER — SODIUM CHLORIDE 0.9% FLUSH: 3.0000 mL | INTRAVENOUS | Status: DC | PRN

## 2015-09-17 MED ORDER — MIDAZOLAM HCL 2 MG/2ML IJ SOLN
INTRAMUSCULAR | Status: AC
  Filled 2015-09-17: qty 2

## 2015-09-17 MED ORDER — HEPARIN (PORCINE) IN NACL 2-0.9 UNIT/ML-% IJ SOLN
INTRAMUSCULAR | Status: AC
  Filled 2015-09-17: qty 1500

## 2015-09-17 MED ORDER — SODIUM CHLORIDE 0.9 % IV SOLN
INTRAVENOUS | Status: DC
  Administered 2015-09-17: 19:00:00 via INTRAVENOUS

## 2015-09-17 MED ORDER — IOPAMIDOL (ISOVUE-370) INJECTION 76%
INTRAVENOUS | Status: AC
  Filled 2015-09-17: qty 100

## 2015-09-17 MED ORDER — TICAGRELOR 90 MG PO TABS
ORAL_TABLET | ORAL | Status: AC
  Filled 2015-09-17: qty 2

## 2015-09-17 MED ORDER — MORPHINE SULFATE (PF) 2 MG/ML IV SOLN
2.0000 mg | INTRAVENOUS | Status: DC | PRN
  Administered 2015-09-17 (×2): 2 mg via INTRAVENOUS
  Filled 2015-09-17: qty 1

## 2015-09-17 MED ORDER — IOPAMIDOL (ISOVUE-370) INJECTION 76%
INTRAVENOUS | Status: AC
Start: 2015-09-17 — End: 2015-09-17
  Filled 2015-09-17: qty 100

## 2015-09-17 MED ORDER — SODIUM CHLORIDE 0.9% FLUSH
3.0000 mL | Freq: Two times a day (BID) | INTRAVENOUS | Status: DC
  Administered 2015-09-17: 22:00:00 3 mL via INTRAVENOUS
  Administered 2015-09-18: 09:00:00 via INTRAVENOUS

## 2015-09-17 MED ORDER — MORPHINE SULFATE (PF) 2 MG/ML IV SOLN
INTRAVENOUS | Status: AC
  Filled 2015-09-17: qty 1

## 2015-09-17 MED ORDER — SODIUM CHLORIDE 0.9% FLUSH
3.0000 mL | Freq: Two times a day (BID) | INTRAVENOUS | Status: DC
  Administered 2015-09-17: 3 mL via INTRAVENOUS

## 2015-09-17 MED ORDER — VERAPAMIL HCL 2.5 MG/ML IV SOLN
INTRA_ARTERIAL | Status: DC | PRN
  Administered 2015-09-17: 15 mL via INTRA_ARTERIAL

## 2015-09-17 MED ORDER — ANGIOPLASTY BOOK
Freq: Once | Status: AC
  Administered 2015-09-17: 19:00:00
  Filled 2015-09-17: qty 1

## 2015-09-17 MED ORDER — SODIUM CHLORIDE 0.9 % IV SOLN
250.0000 mg | INTRAVENOUS | Status: DC | PRN
  Administered 2015-09-17: 1.75 mg/kg/h via INTRAVENOUS

## 2015-09-17 MED ORDER — VERAPAMIL HCL 2.5 MG/ML IV SOLN
INTRAVENOUS | Status: AC
  Filled 2015-09-17: qty 2

## 2015-09-17 MED ORDER — BIVALIRUDIN BOLUS VIA INFUSION - CUPID
INTRAVENOUS | Status: DC | PRN
  Administered 2015-09-17: 61.05 mg via INTRAVENOUS

## 2015-09-17 MED ORDER — ISOSORBIDE MONONITRATE ER 30 MG PO TB24
30.0000 mg | ORAL_TABLET | Freq: Every day | ORAL | Status: DC
  Administered 2015-09-17 – 2015-09-18 (×2): 30 mg via ORAL
  Filled 2015-09-17 (×2): qty 1

## 2015-09-17 MED ORDER — ONDANSETRON HCL 4 MG/2ML IJ SOLN
4.0000 mg | Freq: Four times a day (QID) | INTRAMUSCULAR | Status: DC | PRN
  Administered 2015-09-18: 4 mg via INTRAVENOUS
  Filled 2015-09-17: qty 2

## 2015-09-17 MED ORDER — ATORVASTATIN CALCIUM 80 MG PO TABS: 80.0000 mg | ORAL_TABLET | Freq: Every day | ORAL | Status: DC

## 2015-09-17 MED ORDER — NITROGLYCERIN 1 MG/10 ML FOR IR/CATH LAB
INTRA_ARTERIAL | Status: DC | PRN
  Administered 2015-09-17 (×2): 200 ug via INTRACORONARY

## 2015-09-17 MED ORDER — TICAGRELOR 90 MG PO TABS
90.0000 mg | ORAL_TABLET | Freq: Two times a day (BID) | ORAL | Status: DC
  Administered 2015-09-18: 06:00:00 90 mg via ORAL
  Filled 2015-09-17 (×2): qty 1

## 2015-09-17 MED ORDER — HEART ATTACK BOUNCING BOOK
Freq: Once | Status: AC
  Administered 2015-09-17: 19:00:00
  Filled 2015-09-17: qty 1

## 2015-09-17 MED ORDER — TICAGRELOR 90 MG PO TABS
ORAL_TABLET | ORAL | Status: DC | PRN
  Administered 2015-09-17: 180 mg via ORAL

## 2015-09-17 MED ORDER — LIDOCAINE HCL (PF) 1 % IJ SOLN
INTRAMUSCULAR | Status: AC
  Filled 2015-09-17: qty 30

## 2015-09-17 MED ORDER — NITROGLYCERIN 1 MG/10 ML FOR IR/CATH LAB
INTRA_ARTERIAL | Status: AC
  Filled 2015-09-17: qty 10

## 2015-09-17 MED ORDER — IOPAMIDOL (ISOVUE-370) INJECTION 76%
INTRAVENOUS | Status: DC | PRN
  Administered 2015-09-17: 145 mL

## 2015-09-17 MED ORDER — MIDAZOLAM HCL 2 MG/2ML IJ SOLN
INTRAMUSCULAR | Status: DC | PRN
  Administered 2015-09-17: 2 mg via INTRAVENOUS
  Administered 2015-09-17: 1 mg via INTRAVENOUS

## 2015-09-17 MED ORDER — SODIUM CHLORIDE 0.9 % IV SOLN: INTRAVENOUS | Status: DC

## 2015-09-17 MED ORDER — HEPARIN SODIUM (PORCINE) 1000 UNIT/ML IJ SOLN
INTRAMUSCULAR | Status: DC | PRN
  Administered 2015-09-17: 4000 [IU] via INTRAVENOUS

## 2015-09-17 MED ORDER — ACETAMINOPHEN 325 MG PO TABS
650.0000 mg | ORAL_TABLET | ORAL | Status: DC | PRN
  Administered 2015-09-18: 06:00:00 650 mg via ORAL
  Filled 2015-09-17: qty 2

## 2015-09-17 MED ORDER — FENTANYL CITRATE (PF) 100 MCG/2ML IJ SOLN
INTRAMUSCULAR | Status: DC | PRN
  Administered 2015-09-17: 50 ug via INTRAVENOUS
  Administered 2015-09-17: 25 ug via INTRAVENOUS

## 2015-09-17 MED ORDER — ASPIRIN 81 MG PO CHEW: 81.0000 mg | CHEWABLE_TABLET | Freq: Every day | ORAL | Status: DC

## 2015-09-17 MED ORDER — LIDOCAINE HCL (PF) 1 % IJ SOLN
INTRAMUSCULAR | Status: DC | PRN
  Administered 2015-09-17: 2 mL

## 2015-09-17 SURGICAL SUPPLY — 19 items
BALLN EMERGE MR 2.5X12 (BALLOONS) ×3
BALLN ~~LOC~~ EMERGE MR 2.75X12 (BALLOONS) ×3
BALLOON EMERGE MR 2.5X12 (BALLOONS) ×1 IMPLANT
BALLOON ~~LOC~~ EMERGE MR 2.75X12 (BALLOONS) ×1 IMPLANT
CATH INFINITI 5FR ANG PIGTAIL (CATHETERS) ×3 IMPLANT
CATH OPTITORQUE TIG 4.0 5F (CATHETERS) ×3 IMPLANT
CATH VISTA GUIDE 6FR XB3.5 (CATHETERS) ×3 IMPLANT
DEVICE RAD COMP TR BAND LRG (VASCULAR PRODUCTS) ×3 IMPLANT
GLIDESHEATH SLEND SS 6F .021 (SHEATH) ×3 IMPLANT
GUIDE CATH RUNWAY 6FR CLS3.5 (CATHETERS) ×3 IMPLANT
KIT ENCORE 26 ADVANTAGE (KITS) ×3 IMPLANT
KIT HEART LEFT (KITS) ×3 IMPLANT
PACK CARDIAC CATHETERIZATION (CUSTOM PROCEDURE TRAY) ×3 IMPLANT
STENT RESOLUTE INTEG 2.5X14 (Permanent Stent) ×3 IMPLANT
SYR MEDRAD MARK V 150ML (SYRINGE) ×3 IMPLANT
TRANSDUCER W/STOPCOCK (MISCELLANEOUS) ×3 IMPLANT
TUBING CIL FLEX 10 FLL-RA (TUBING) ×3 IMPLANT
WIRE PT2 MS 185 (WIRE) ×3 IMPLANT
WIRE SAFE-T 1.5MM-J .035X260CM (WIRE) ×3 IMPLANT

## 2015-09-17 NOTE — Care Management Note (Signed)
Case Management Note  Patient Details  Name: Marcello FennelKendall M Son MRN: [id number] Date of Birth: April 21, 1965  Subjective/Objective:    Pt presented for chest pain- Nstemi. Increased troponin. IV heparin gtt-plan for cardiac cath.                 Action/Plan: No needs identified by CM at this time. Will continue to monitor.   Expected Discharge Date:                  Expected Discharge Plan:  Home/Self Care  In-House Referral:  NA  Discharge planning Services  CM Consult  Post Acute Care Choice:  NA Choice offered to:  NA  DME Arranged:  N/A DME Agency:  NA  HH Arranged:  NA HH Agency:  NA  Status of Service:  Completed, signed off  If discussed at Long Length of Stay Meetings, dates discussed:    Additional Comments:  Gala LewandowskyGraves-Bigelow, [place] Kaye, RN 09/17/2015, 1:43 PM

## 2015-09-17 NOTE — Progress Notes (Signed)
Dr. Tarri GlennAzeem responded to page about pt's critical troponin level. MD advised to keep monitoring pt d/t no active chest pain. Pt is stable, VSS, and no chest pain noted.

## 2015-09-17 NOTE — Interval H&P Note (Signed)
Cath Lab Visit (complete for each Cath Lab visit)  Clinical Evaluation Leading to the Procedure:   ACS: Yes.    Non-ACS:    Anginal Classification: CCS III  Anti-ischemic medical therapy: Maximal Therapy (2 or more classes of medications)  Non-Invasive Test Results: No non-invasive testing performed  Prior CABG: No previous CABG      History and Physical Interval Note:  09/17/2015 4:55 PM  Victor FennelKendall M Tierney  has presented today for surgery, with the diagnosis of cp  The various methods of treatment have been discussed with the patient and family. After consideration of risks, benefits and other options for treatment, the patient has consented to  Procedure(s): Left Heart Cath and Coronary Angiography (N/A) as a surgical intervention .  The patient's history has been reviewed, patient examined, no change in status, stable for surgery.  I have reviewed the patient's chart and labs.  Questions were answered to the patient's satisfaction.     Nicki Guadalajarahomas Aleta Manternach

## 2015-09-17 NOTE — Progress Notes (Signed)
ANTICOAGULATION CONSULT NOTE - Follow Up Consult  Pharmacy Consult for Heparin  Indication: chest pain/ACS  No Known Allergies  Patient Measurements: Height: 5\' 7"  (170.2 cm) Weight: 179 lb 6.4 oz (81.375 kg) IBW/kg (Calculated) : 66.1  Vital Signs: Temp: 97.9 F (36.6 C) (07/10 0500) BP: 122/71 mmHg (07/10 0500) Pulse Rate: 68 (07/10 0500)  Labs:  Recent Labs  09/16/15 1706  09/17/15 0441 09/17/15 1314 09/17/15 2342  HGB 14.6  --  13.9  --   --   HCT 43.0  --  41.8  --   --   PLT 276  --  269  --   --   APTT  --   --   --   --  30  LABPROT  --   --   --   --  13.9  INR  --   --   --   --  1.05  HEPARINUNFRC  --   --  0.50 0.37  --   CREATININE 1.02  --   --   --   --   TROPONINI 0.03*  < > 8.54* 8.32* 3.14*  < > = values in this interval not displayed.  Estimated Creatinine Clearance: 86.5 mL/min (by C-G formula based on Cr of 1.02).   Assessment: 50 y.o. male with chest pain/NSTEMI, pharmacy consulted to start heparin. Pt also on aspirin and started on Plavix with load. No bleeding or problems with infusion noted. Heparin level remains therapeutic at 0.37 this afternoon.  Goal of Therapy:  Heparin level 0.3-0.7 units/ml Monitor platelets by anticoagulation protocol: Yes   Plan:  Continue heparin infusion at 1000 units/hr Check anti-Xa level daily while on heparin Continue to monitor H&H and platelets F/u post cath   Thank you for allowing us to participate in this patients care. Signe Coltonya C Retta Pitcher, PharmD Pager: [phone]  09/17/2015,2:08 PM

## 2015-09-17 NOTE — H&P (View-Only) (Signed)
Patient Name: [name] Date of Encounter: 09/17/2015  Active Problems:   Unstable angina [facility](HCC)   NSTEMI (non-ST elevated myocardial infarction) [facility](HCC)   Primary Cardiologist: New Patient Profile: [name] is a 50 year old male with a past medical history of HTN. He presented to the ED at [facility] on 09/16/15 with chest pain with associated SOB and diaphoresis. Troponin 0.03>>0.98>>8.54>>3.14. Plan is for cath today.   SUBJECTIVE: Feels tired, having intermittent chest pain. Denies SOB.    OBJECTIVE Filed Vitals:   09/16/15 2300 09/17/15 0000 09/17/15 0400 09/17/15 0500  BP:    122/71  Pulse:    68  Temp:    97.9 F (36.6 C)  TempSrc:      Resp:  21 16 17   Height: 5\' 7"  (1.702 m)     Weight: 179 lb 6.4 oz (81.375 kg)     SpO2:    100%    Intake/Output Summary (Last 24 hours) at 09/17/15 [id number] Last data filed at 09/17/15 0700  Gross per 24 hour  Intake      0 ml  Output    200 ml  Net   -200 ml   Filed Weights   09/16/15 1650 09/16/15 2300  Weight: 180 lb (81.647 kg) 179 lb 6.4 oz (81.375 kg)    PHYSICAL EXAM General: Well developed, well nourished, male in no acute distress. Head: Normocephalic, atraumatic.  Neck: Supple without bruits, No JVD. Lungs:  Resp regular and unlabored, CTA. Heart: RRR, S1, S2, no S3, S4, or murmur; no rub. Abdomen: Soft, non-tender, non-distended, BS + x 4.  Extremities: No clubbing, cyanosis, No edema.  Neuro: Alert and oriented X 3. Moves all extremities spontaneously. Psych: Normal affect.  LABS: CBC: Recent Labs  09/16/15 1706 09/17/15 0441  WBC 6.6 10.4  HGB 14.6 13.9  HCT 43.0 41.8  MCV 86.7 87.6  PLT 276 269   INR: Recent Labs  09/17/15 2342  INR 1.05   Basic Metabolic Panel: Recent Labs  09/16/15 1706 09/17/15 2342  NA 141  --   K 3.8  --   CL 108  --   CO2 27  --   GLUCOSE 106*  --   BUN 16  --   CREATININE 1.02  --   CALCIUM 9.2  --   MG  --  2.3   Liver Function Tests: Recent  Labs  09/17/15 2342  AST 48*  ALT 33  ALKPHOS 67  BILITOT 0.3  PROT 6.9  ALBUMIN 4.2   Cardiac Enzymes: Recent Labs  09/16/15 2056 09/17/15 0441 09/17/15 2342  TROPONINI 0.98* 8.54* 3.14*   BNP:  B NATRIURETIC PEPTIDE  Date/Time Value Ref Range Status  09/17/2015 11:42 PM 23.2 0.0 - 100.0 pg/mL Final   Fasting Lipid Panel: Recent Labs  09/17/15 0441  CHOL 217*  HDL 45  LDLCALC 139*  TRIG 164*  CHOLHDL 4.8       Current facility-administered medications:  .  acetaminophen (TYLENOL) tablet 650 mg, 650 mg, Oral, Q4H PRN, Nevin BloodgoodAmir Azeem, MD, 650 mg at 09/16/15 2358 .  aspirin EC tablet 81 mg, 81 mg, Oral, Daily, Nevin BloodgoodAmir Azeem, MD, 81 mg at 09/17/15 0848 .  atorvastatin (LIPITOR) tablet 80 mg, 80 mg, Oral, q1800, Nevin BloodgoodAmir Azeem, MD, 80 mg at 09/16/15 2356 .  clopidogrel (PLAVIX) tablet 75 mg, 75 mg, Oral, Q breakfast, Nevin BloodgoodAmir Azeem, MD, 75 mg at 09/17/15 0848 .  heparin ADULT infusion 100 units/mL (25000 units/26450mL sodium chloride  0.45%), 1,000 Units/hr, Intravenous, Continuous, Quintella Reichert, MD, Last Rate: 10 mL/hr at 09/16/15 2330, 1,000 Units/hr at 09/16/15 2330 .  metoprolol tartrate (LOPRESSOR) tablet 12.5 mg, 12.5 mg, Oral, BID, Nevin Bloodgood, MD, 12.5 mg at 09/17/15 0848 .  nitroGLYCERIN (NITROGLYN) 2 % ointment 1 inch, 1 inch, Topical, Q8H, Nevin Bloodgood, MD, 1 inch at 09/17/15 0000 .  nitroGLYCERIN (NITROSTAT) SL tablet 0.4 mg, 0.4 mg, Sublingual, Q5 Min x 3 PRN, Nevin Bloodgood, MD .  ondansetron (ZOFRAN) injection 4 mg, 4 mg, Intravenous, Q6H PRN, Nevin Bloodgood, MD .  pantoprazole (PROTONIX) EC tablet 40 mg, 40 mg, Oral, Daily, Nevin Bloodgood, MD, 40 mg at 09/17/15 0848 . heparin 1,000 Units/hr (09/16/15 2330)      TELE:    NSR    ECG: NSR  Radiology/Studies: Dg Chest 2 View  09/16/2015  CLINICAL DATA:  Acute onset of mid to right-sided chest pain and left shoulder and arm pain. Initial encounter. EXAM: CHEST  2 VIEW COMPARISON:  None. FINDINGS: The lungs are well-aerated. Pulmonary  vascularity is at the upper limits of normal. There is no evidence of focal opacification, pleural effusion or pneumothorax. The heart is normal in size; the mediastinal contour is within normal limits. No acute osseous abnormalities are seen. IMPRESSION: No acute cardiopulmonary process seen. Electronically Signed   By: Roanna Raider M.D.   On: 09/16/2015 17:05     Current Medications:  . aspirin EC  81 mg Oral Daily  . atorvastatin  80 mg Oral q1800  . clopidogrel  75 mg Oral Q breakfast  . metoprolol tartrate  12.5 mg Oral BID  . nitroGLYCERIN  1 inch Topical Q8H  . pantoprazole  40 mg Oral Daily   . heparin 1,000 Units/hr (09/16/15 2330)    ASSESSMENT AND PLAN: Active Problems:   Unstable angina (HCC)   NSTEMI (non-ST elevated myocardial infarction) (HCC)  1. NSTEMI: Patient states that his chest pain began Friday night while he was sitting at his computer. Describes pain as pressure, like something is sitting on his chest. Also feels a "fluttering". He says his pain was accompanied by profuse diaphoresis. Continued to have pain on Saturday while walking through a store, had to stop and rest, again with diaphoresis. On Sunday morning, he was awakened from sleep with chest pain and interscapular pain. Troponin has peaked at 8.54. EKG does not show any ST elevation. On Heparin gtt.   Plan is for cath today. Patient has a strong family history of MI, his father had he has 6 brothers, 2 of which have had multiple MI's and have stents. Patient is a never smoker, denies ETOH. He works out 3 times a week, but did not work out this past week because he felt badly, weak.   2. HLD: LDL is 139 this admission. Was not previously on statin, will continue high intensity statin that was started this admission.   3. HTN: BP well controlled, he is on beta blocker. On lisinopril at home. Can add back to his regimen if needed.   Signed, Little Ishikawa , NP 9:09 AM 09/17/2015 Pager [phone]

## 2015-09-17 NOTE — Progress Notes (Signed)
Patient Name: Marcello FennelKendall M Derksen Date of Encounter: 09/17/2015  Active Problems:   Unstable angina [facility](HCC)   NSTEMI (non-ST elevated myocardial infarction) [facility](HCC)   Primary Cardiologist: New Patient Profile: Mr. [name] is a 50 year old male with a past medical history of HTN. He presented to the ED at [facility] on 09/16/15 with chest pain with associated SOB and diaphoresis. Troponin 0.03>>0.98>>8.54>>3.14. Plan is for cath today.   SUBJECTIVE: Feels tired, having intermittent chest pain. Denies SOB.    OBJECTIVE Filed Vitals:   09/16/15 2300 09/17/15 0000 09/17/15 0400 09/17/15 0500  BP:    122/71  Pulse:    68  Temp:    97.9 F (36.6 C)  TempSrc:      Resp:  21 16 17   Height: 5\' 7"  (1.702 m)     Weight: 179 lb 6.4 oz (81.375 kg)     SpO2:    100%    Intake/Output Summary (Last 24 hours) at 09/17/15 [id number] Last data filed at 09/17/15 0700  Gross per 24 hour  Intake      0 ml  Output    200 ml  Net   -200 ml   Filed Weights   09/16/15 1650 09/16/15 2300  Weight: 180 lb (81.647 kg) 179 lb 6.4 oz (81.375 kg)    PHYSICAL EXAM General: Well developed, well nourished, male in no acute distress. Head: Normocephalic, atraumatic.  Neck: Supple without bruits, No JVD. Lungs:  Resp regular and unlabored, CTA. Heart: RRR, S1, S2, no S3, S4, or murmur; no rub. Abdomen: Soft, non-tender, non-distended, BS + x 4.  Extremities: No clubbing, cyanosis, No edema.  Neuro: Alert and oriented X 3. Moves all extremities spontaneously. Psych: Normal affect.  LABS: CBC: Recent Labs  09/16/15 1706 09/17/15 0441  WBC 6.6 10.4  HGB 14.6 13.9  HCT 43.0 41.8  MCV 86.7 87.6  PLT 276 269   INR: Recent Labs  09/17/15 2342  INR 1.05   Basic Metabolic Panel: Recent Labs  09/16/15 1706 09/17/15 2342  NA 141  --   K 3.8  --   CL 108  --   CO2 27  --   GLUCOSE 106*  --   BUN 16  --   CREATININE 1.02  --   CALCIUM 9.2  --   MG  --  2.3   Liver Function Tests: Recent  Labs  09/17/15 2342  AST 48*  ALT 33  ALKPHOS 67  BILITOT 0.3  PROT 6.9  ALBUMIN 4.2   Cardiac Enzymes: Recent Labs  09/16/15 2056 09/17/15 0441 09/17/15 2342  TROPONINI 0.98* 8.54* 3.14*   BNP:  B NATRIURETIC PEPTIDE  Date/Time Value Ref Range Status  09/17/2015 11:42 PM 23.2 0.0 - 100.0 pg/mL Final   Fasting Lipid Panel: Recent Labs  09/17/15 0441  CHOL 217*  HDL 45  LDLCALC 139*  TRIG 164*  CHOLHDL 4.8       Current facility-administered medications:  .  acetaminophen (TYLENOL) tablet 650 mg, 650 mg, Oral, Q4H PRN, Nevin BloodgoodAmir Azeem, MD, 650 mg at 09/16/15 2358 .  aspirin EC tablet 81 mg, 81 mg, Oral, Daily, Nevin BloodgoodAmir Azeem, MD, 81 mg at 09/17/15 0848 .  atorvastatin (LIPITOR) tablet 80 mg, 80 mg, Oral, q1800, Nevin BloodgoodAmir Azeem, MD, 80 mg at 09/16/15 2356 .  clopidogrel (PLAVIX) tablet 75 mg, 75 mg, Oral, Q breakfast, Nevin BloodgoodAmir Azeem, MD, 75 mg at 09/17/15 0848 .  heparin ADULT infusion 100 units/mL (25000 units/26450mL sodium chloride  0.45%), 1,000 Units/hr, Intravenous, Continuous, Traci R Turner, MD, Last Rate: 10 mL/hr at 09/16/15 2330, 1,000 Units/hr at 09/16/15 2330 .  metoprolol tartrate (LOPRESSOR) tablet 12.5 mg, 12.5 mg, Oral, BID, Amir Azeem, MD, 12.5 mg at 09/17/15 0848 .  nitroGLYCERIN (NITROGLYN) 2 % ointment 1 inch, 1 inch, Topical, Q8H, Amir Azeem, MD, 1 inch at 09/17/15 0000 .  nitroGLYCERIN (NITROSTAT) SL tablet 0.4 mg, 0.4 mg, Sublingual, Q5 Min x 3 PRN, Amir Azeem, MD .  ondansetron (ZOFRAN) injection 4 mg, 4 mg, Intravenous, Q6H PRN, Amir Azeem, MD .  pantoprazole (PROTONIX) EC tablet 40 mg, 40 mg, Oral, Daily, Amir Azeem, MD, 40 mg at 09/17/15 0848 . heparin 1,000 Units/hr (09/16/15 2330)      TELE:    NSR    ECG: NSR  Radiology/Studies: Dg Chest 2 View  09/16/2015  CLINICAL DATA:  Acute onset of mid to right-sided chest pain and left shoulder and arm pain. Initial encounter. EXAM: CHEST  2 VIEW COMPARISON:  None. FINDINGS: The lungs are well-aerated. Pulmonary  vascularity is at the upper limits of normal. There is no evidence of focal opacification, pleural effusion or pneumothorax. The heart is normal in size; the mediastinal contour is within normal limits. No acute osseous abnormalities are seen. IMPRESSION: No acute cardiopulmonary process seen. Electronically Signed   By: Jeffery  Chang M.D.   On: 09/16/2015 17:05     Current Medications:  . aspirin EC  81 mg Oral Daily  . atorvastatin  80 mg Oral q1800  . clopidogrel  75 mg Oral Q breakfast  . metoprolol tartrate  12.5 mg Oral BID  . nitroGLYCERIN  1 inch Topical Q8H  . pantoprazole  40 mg Oral Daily   . heparin 1,000 Units/hr (09/16/15 2330)    ASSESSMENT AND PLAN: Active Problems:   Unstable angina (HCC)   NSTEMI (non-ST elevated myocardial infarction) (HCC)  1. NSTEMI: Patient states that his chest pain began Friday night while he was sitting at his computer. Describes pain as pressure, like something is sitting on his chest. Also feels a "fluttering". He says his pain was accompanied by profuse diaphoresis. Continued to have pain on Saturday while walking through a store, had to stop and rest, again with diaphoresis. On Sunday morning, he was awakened from sleep with chest pain and interscapular pain. Troponin has peaked at 8.54. EKG does not show any ST elevation. On Heparin gtt.   Plan is for cath today. Patient has a strong family history of MI, his father had he has 6 brothers, 2 of which have had multiple MI's and have stents. Patient is a never smoker, denies ETOH. He works out 3 times a week, but did not work out this past week because he felt badly, weak.   2. HLD: LDL is 139 this admission. Was not previously on statin, will continue high intensity statin that was started this admission.   3. HTN: BP well controlled, he is on beta blocker. On lisinopril at home. Can add back to his regimen if needed.   Signed, Yomaira Solar E Helio Lack , NP 9:09 AM 09/17/2015 Pager [phone]  

## 2015-09-17 NOTE — Progress Notes (Signed)
  Echocardiogram 2D Echocardiogram has been performed.  Delcie RochENNINGTON, Charls Custer 09/17/2015, 2:37 PM

## 2015-09-17 NOTE — Progress Notes (Signed)
ANTICOAGULATION CONSULT NOTE - Follow Up Consult  Pharmacy Consult for Heparin  Indication: chest pain/ACS  No Known Allergies  Patient Measurements: Height: 5\' 7"  (170.2 cm) Weight: 179 lb 6.4 oz (81.375 kg) IBW/kg (Calculated) : 66.1  Vital Signs: Temp: 97.9 F (36.6 C) (07/10 0500) Temp Source: Oral (07/09 2221) BP: 122/71 mmHg (07/10 0500) Pulse Rate: 68 (07/10 0500)  Labs:  Recent Labs  09/16/15 1706 09/16/15 2056 09/17/15 0441 09/17/15 2342  HGB 14.6  --  13.9  --   HCT 43.0  --  41.8  --   PLT 276  --  269  --   APTT  --   --   --  30  LABPROT  --   --   --  13.9  INR  --   --   --  1.05  HEPARINUNFRC  --   --  0.50  --   CREATININE 1.02  --   --   --   TROPONINI 0.03* 0.98* 8.54* 3.14*    Estimated Creatinine Clearance: 86.5 mL/min (by C-G formula based on Cr of 1.02).   Assessment: Heparin for NSTEMI, initial heparin level is good  Goal of Therapy:  Heparin level 0.3-0.7 units/ml Monitor platelets by anticoagulation protocol: Yes   Plan:  -Cont heparin at 1000 units/hr -1200 HL  [name], [name] 09/17/2015,6:09 AM

## 2015-09-17 NOTE — Progress Notes (Signed)
CRITICAL VALUE ALERT  Critical value received:  Troponin   Date of notification:  09/17/2015  Time of notification:  0045  Critical value read back: Yes Nurse who received alert:  Clydene LamingMorgan Daphnie Venturini, RN   MD notified (1st page):  Dr. Tarri GlennAzeem   Time of first page:  [phone]

## 2015-09-18 ENCOUNTER — Encounter (HOSPITAL_COMMUNITY): Payer: Self-pay | Admitting: Cardiovascular Disease

## 2015-09-18 ENCOUNTER — Telehealth: Payer: Self-pay | Admitting: Cardiovascular Disease

## 2015-09-18 DIAGNOSIS — Z955 Presence of coronary angioplasty implant and graft: Secondary | ICD-10-CM | POA: Insufficient documentation

## 2015-09-18 LAB — BASIC METABOLIC PANEL
Anion gap: 8 (ref 5–15)
BUN: 8 mg/dL (ref 6–20)
CHLORIDE: 103 mmol/L (ref 101–111)
CO2: 25 mmol/L (ref 22–32)
CREATININE: 0.64 mg/dL (ref 0.61–1.24)
Calcium: 9 mg/dL (ref 8.9–10.3)
GFR calc Af Amer: >60 mL/min (ref >60)
GFR calc non Af Amer: >60 mL/min (ref >60)
GLUCOSE: 127 mg/dL — AB (ref 65–99)
POTASSIUM: 3.8 mmol/L (ref 3.5–5.1)
SODIUM: 136 mmol/L (ref 135–145)

## 2015-09-18 LAB — CBC
HEMATOCRIT: 38.8 % — AB (ref 39.0–52.0)
Hemoglobin: 13.2 g/dL (ref 13.0–17.0)
MCH: 29.5 pg (ref 26.0–34.0)
MCHC: 34 g/dL (ref 30.0–36.0)
MCV: 86.6 fL (ref 78.0–100.0)
PLATELETS: 253 10*3/uL (ref 150–400)
RBC: 4.48 MIL/uL (ref 4.22–5.81)
RDW: 13 % (ref 11.5–15.5)
WBC: 9.4 10*3/uL (ref 4.0–10.5)

## 2015-09-18 LAB — HEMOGLOBIN A1C
HEMOGLOBIN A1C: 5.4 % (ref 4.8–5.6)
MEAN PLASMA GLUCOSE: 108 mg/dL

## 2015-09-18 MED ORDER — ATORVASTATIN CALCIUM 80 MG PO TABS: 80.0000 mg | ORAL_TABLET | Freq: Every day | ORAL | Status: DC

## 2015-09-18 MED ORDER — NITROGLYCERIN 0.4 MG SL SUBL: 0.4000 mg | SUBLINGUAL_TABLET | SUBLINGUAL | Status: DC | PRN

## 2015-09-18 MED ORDER — TICAGRELOR 90 MG PO TABS: 90.0000 mg | ORAL_TABLET | Freq: Two times a day (BID) | ORAL | Status: DC

## 2015-09-18 MED ORDER — METOPROLOL SUCCINATE ER 25 MG PO TB24: 25.0000 mg | ORAL_TABLET | Freq: Every day | ORAL | Status: DC

## 2015-09-18 MED ORDER — ASPIRIN 81 MG PO TBEC: 81.0000 mg | DELAYED_RELEASE_TABLET | Freq: Every day | ORAL | Status: DC

## 2015-09-18 MED ORDER — PANTOPRAZOLE SODIUM 40 MG PO TBEC: 40.0000 mg | DELAYED_RELEASE_TABLET | Freq: Every day | ORAL | Status: DC

## 2015-09-18 MED ORDER — METOPROLOL SUCCINATE ER 25 MG PO TB24
25.0000 mg | ORAL_TABLET | Freq: Every day | ORAL | Status: DC
Start: 2015-09-19 — End: 2015-09-18

## 2015-09-18 MED ORDER — ISOSORBIDE MONONITRATE ER 30 MG PO TB24: 30.0000 mg | ORAL_TABLET | Freq: Every day | ORAL | Status: DC

## 2015-09-18 NOTE — Telephone Encounter (Signed)
[name]. Patient understands to follow up with provider 8:30am w/ Tereso NewcomerScott Weaver at [facility]  Patient understands discharge instructions? yes  Patient understands medications and regiment? yes Patient understands to bring all medications to this visit? yes

## 2015-09-18 NOTE — Discharge Summary (Signed)
Discharge Summary    Patient ID: [name],  MRN: [id number], DOB/AGE: 08/11/1965 50 y.o.  Admit date: 09/16/2015 Discharge date: 09/18/2015  [facility] Primary Cardiologist: Dr. Duke Salviaandolph   Discharge Diagnoses    Active Problems:   Unstable angina [facility](HCC)   NSTEMI (non-ST elevated myocardial infarction) [facility](HCC)   Hypertensive heart disease   Hyperlipidemia   Status post coronary artery stent placement   Allergies No Known Allergies  Diagnostic Studies/Procedures   Coronary Stent Intervention    Left Heart Cath and Coronary Angiography     Prox LAD to Mid LAD lesion, 40% stenosed.  Mid LAD to Dist LAD lesion, 50% stenosed.  2nd Mrg lesion, 100% stenosed. Post intervention, there is a 0% residual stenosis.  The left ventricular systolic function is normal.  Dist Cx lesion, 20% stenosed.  Preserved global LV function with an ejection fraction of 55% and evidence for very mild mid inferior hypocontractility.  Two-vessel coronary obstructive disease with 40% proximal LAD stenosis in the region of the first diagonal vessel, and 50% LAD narrowing just after the second diagonal takeoff; moderate size left circumflex coronary artery with 20% narrowing in the OM1 vessel, total occlusion of the OM 2 vessel with TIMI 0 flow antegrade, and 20% distal circumflex stenosis; and a large dominant RCA with very faint collateralization from the PLA vessel to the distal circumflex marginal 2 vessel.  Successful PCI to the totally occluded circumflex OM 2 vessel with ultimate insertion of a 2.514 mm Resolute DES stent postdilated to 2.71 mm with 100% occlusion reduced to 0% and resumption of brisk TIMI-3 flow.  RECOMMENDATION: The patient was switched to brilinta from Plavix in light of presenting with acute coronary syndrome/NSTEMI, he will continue dual antiplatelet therapy for minimum of a year. Medical therapy for mild to moderate concomitant  CAD.  Transthoracic Echocardiography 09/17/15 Study Conclusions  - Left ventricle: The cavity size was normal. Wall thickness was  normal. Systolic function was normal. The estimated ejection  fraction was in the range of 50% to 55%. Wall motion was normal;  there were no regional wall motion abnormalities. Doppler  parameters are consistent with abnormal left ventricular  relaxation (grade 1 diastolic dysfunction).  _____________   History of Present Illness   [name] is a 50 year old male with a past medical history of hypertension and chronic back pain. He [facility] to [facility] emergency room on 09/16/2015 with chest pain that started 2 days prior. He had unstable angina at rest and then developed profuse diaphoresis and shortness of breath. His troponin was elevated at 0.98, so [facility]. He had no prior cardiac history, has a strong family history of MI. His father and 2 out of 6 of his brothers have had multiple MIs. He is a nonsmoker and denies alcohol use. He works out 3 times a week.   Hospital Course   He was taken to the cath lab on 09/17/2015. Cath report above. His left circumflex was totally occluded. He received a drug-eluting stent to his left circumflex. He also had some some residual disease in his LAD. This will be treated medically.  He has ambulated with cardiac rehabilitation without chest pain or shortness of breath. He will go home on 25 mg of metoprolol succinate. He was previously on lisinopril at home, but his blood pressure is well controlled with beta blocker alone. His EF is preserved at 50-55%.  He will need dual  antiplatelet therapy for one year with Brilinta and ASA. We will also start high intensity statin as his LDL is 139.  He will need LFTs in 6 weeks.   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 We will write him out of work for 2 weeks. He will follow-up with our  office in 7-10 days. His right radial site is stable with no hematoma.   He was seen today by Dr. Duke Salvia and deemed suitable for discharge.  _____________  Discharge Vitals Blood pressure 124/60, pulse 71, temperature 97.6 F (36.4 C), temperature source Oral, resp. rate 16, height  (1.702 m), weight 180 lb 12.4 oz (82 kg), SpO2 92 %.  Filed Weights   09/16/15 1650 09/16/15 2300 09/18/15 0631  Weight: 180 lb (81.647 kg) 179 lb 6.4 oz (81.375 kg) 180 lb 12.4 oz (82 kg)    Labs & Radiologic Studies     CBC  Recent Labs  09/17/15 0441 09/18/15 0535  WBC 10.4 9.4  HGB 13.9 13.2  HCT 41.8 38.8*  MCV 87.6 86.6  PLT 269 253   Basic Metabolic Panel  Recent Labs  09/16/15 1706 09/17/15 2342 09/18/15 0535  NA 141  --  136  K 3.8  --  3.8  CL 108  --  103  CO2 27  --  25  GLUCOSE 106*  --  127*  BUN 16  --  8  CREATININE 1.02  --  0.64  CALCIUM 9.2  --  9.0  MG  --  2.3  --    Liver Function Tests  Recent Labs  09/17/15 2342  AST 48*  ALT 33  ALKPHOS 67  BILITOT 0.3  PROT 6.9  ALBUMIN 4.2   No results for input(s): LIPASE, AMYLASE in the last 72 hours. Cardiac Enzymes  Recent Labs  09/17/15 0441 09/17/15 1314 09/17/15 2342  TROPONINI 8.54* 8.32* 3.14*   BNP Invalid input(s): POCBNP D-Dimer No results for input(s): DDIMER in the last 72 hours. Hemoglobin A1C  Recent Labs  09/17/15 2342  HGBA1C 5.4   Fasting Lipid Panel  Recent Labs  09/17/15 0441  CHOL 217*  HDL 45  LDLCALC 139*  TRIG 164*  CHOLHDL 4.8    Dg Chest 2 View  09/16/2015  CLINICAL DATA:  Acute onset of mid to right-sided chest pain and left shoulder and arm pain. Initial encounter. EXAM: CHEST  2 VIEW COMPARISON:  None. FINDINGS: The lungs are well-aerated. Pulmonary vascularity is at the upper limits of normal. There is no evidence of focal opacification, pleural effusion or pneumothorax. The heart is normal in size; the mediastinal contour is within normal limits. No  acute osseous abnormalities are seen. IMPRESSION: No acute cardiopulmonary process seen. Electronically Signed   By: Roanna Raider M.D.   On: 09/16/2015 17:05    Disposition   Pt is being discharged home today in good condition.  Follow-up Plans & Appointments    Follow-up Information    Follow up with Tereso Newcomer, PA-C On 09/25/2015.   Specialties:  Cardiology, Physician Assistant   Why:  at 8:30am for hospital follow up    Contact information:   [address] [place] [address] [phone]      Discharge Instructions    Amb Referral to Cardiac Rehabilitation    Complete by:  As directed   Diagnosis:   NSTEMI Comment - work schedule not conducive for pt to attend  Coronary Stents       Diet - low sodium heart healthy    Complete by:  As directed      Discharge instructions    Complete by:  As directed   NO HEAVY LIFTING OR SEXUAL ACTIVITY for 7 DAYS. NO DRIVING for 3-5 DAYS. NO SOAKING BATHS, HOT TUBS, POOLS, ETC., for 7 DAYS  Radial Site Care Refer to this sheet in the next few weeks. These instructions provide you with information on caring for yourself after your procedure. Your caregiver may also give you more specific instructions. Your treatment has been planned according to current medical practices, but problems sometimes occur. Call your caregiver if you have any problems or questions after your procedure. HOME CARE INSTRUCTIONS You may shower the day after the procedure.Remove the bandage (dressing) and gently wash the site with plain soap and water.Gently pat the site dry.  Do not apply powder or lotion to the site.  Do not submerge the affected site in water for 3 to 5 days.  Inspect the site at least twice daily.  Do not flex or bend the affected arm for 24 hours.  No lifting over 5 pounds (2.3 kg) for 5 days after your procedure.  Do not drive home if you are discharged the same day of the procedure. Have someone else drive you.  You  may drive 24 hours after the procedure unless otherwise instructed by your caregiver.  What to expect: Any bruising will usually fade within 1 to 2 weeks.  Blood that collects in the tissue (hematoma) may be painful to the touch. It should usually decrease in size and tenderness within 1 to 2 weeks.  SEEK IMMEDIATE MEDICAL CARE IF: You have unusual pain at the radial site.  You have redness, warmth, swelling, or pain at the radial site.  You have drainage (other than a small amount of blood on the dressing).  You have chills.  You have a fever or persistent symptoms for more than 72 hours.  You have a fever and your symptoms suddenly get worse.  Your arm becomes pale, cool, tingly, or numb.  You have heavy bleeding from the site. Hold pressure on the site.     Increase activity slowly    Complete by:  As directed            Discharge Medications   Current Discharge Medication List    START taking these medications   Details  atorvastatin (LIPITOR) 80 MG tablet Take 1 tablet (80 mg total) by mouth daily at 6 PM. Qty: 30 tablet, Refills: 12    isosorbide mononitrate (IMDUR) 30 MG 24 hr tablet Take 1 tablet (30 mg total) by mouth daily. Qty: 30 tablet, Refills: 12    metoprolol succinate (TOPROL-XL) 25 MG 24 hr tablet Take 1 tablet (25 mg total) by mouth daily. Qty: 30 tablet, Refills: 12    nitroGLYCERIN (NITROSTAT) 0.4 MG SL tablet Place 1 tablet (0.4 mg total) under the tongue every 5 (five) minutes x 3 doses as needed for chest pain. Qty: 25 tablet, Refills: 1    pantoprazole (PROTONIX) 40 MG tablet Take 1 tablet (40 mg total) by mouth daily. Qty: 30 tablet, Refills: 12    !! ticagrelor (BRILINTA) 90 MG TABS tablet Take 1 tablet (90 mg total) by mouth 2 (two) times daily. Qty: 60 tablet, Refills: 12    !! ticagrelor (BRILINTA) 90 MG TABS tablet Take 1 tablet (90 mg total) by mouth 2 (two) times  daily. Qty: 180 tablet, Refills: 0     !! - Potential duplicate medications  found. Please discuss with provider.    CONTINUE these medications which have CHANGED   Details  aspirin EC 81 MG EC tablet Take 1 tablet (81 mg total) by mouth daily.      CONTINUE these medications which have NOT CHANGED   Details  HYDROcodone-acetaminophen (NORCO) 7.5-325 MG tablet TAKE 1 TABLET BY MOUTH EVERY EIGHT HOURS AS NEEDED FOR PAIN Refills: 0    Multiple Vitamin (MULTIVITAMIN WITH MINERALS) TABS tablet Take 1 tablet by mouth daily.      STOP taking these medications     lansoprazole (PREVACID) 30 MG capsule      lisinopril (PRINIVIL,ZESTRIL) 10 MG tablet          Aspirin prescribed at discharge? Yes  High Intensity Statin Prescribed? Yes Beta Blocker Prescribed? Yes For EF 45% or less, Was ACEI/ARB Prescribed? Ef 50-55% ADP Receptor Inhibitor Prescribed?Yes For EF <40%, Aldosterone Inhibitor Prescribed? No, EF 50-55% Was EF assessed during THIS hospitalization? Yes Was Cardiac Rehab II ordered? Yes   Outstanding Labs/Studies  Hepatic function in 6 weeks  Duration of Discharge Encounter   Greater than 30 minutes including physician time.  Signed, Little Ishikawa NP 09/18/2015, 11:36 AM

## 2015-09-18 NOTE — Care Management Note (Signed)
Case Management Note  Patient Details  Name: Marcello FennelKendall M Amaro MRN: [id number] Date of Birth: 1966/01/18  Subjective/Objective:    Chest pain, NSTEMI, DES to circumflex               Referral for Brilinta  Action/Plan:  Discharge Planning:   NCM spoke to pt and wife, Melissa at bedside. Provided pt with Brilinta 30 day free trial card and copay card for $18. [place] [place] and [facility] do have medication in stock. Pt independent prior to hospital stay.   Minette BrinePCP-SHAH, ASHISH MD  Expected Discharge Date:  09/18/2015           Expected Discharge Plan:  Home/Self Care  In-House Referral:  NA  Discharge planning Services  CM Consult  Post Acute Care Choice:  NA Choice offered to:  NA  DME Arranged:  N/A DME Agency:  NA  HH Arranged:  NA HH Agency:  NA  Status of Service:  Completed, signed off  If discussed at Long Length of Stay Meetings, dates discussed:    Additional Comments:  Elliot CousinShavis, Andreya Lacks Ellen, RN 09/18/2015, 9:17 AM

## 2015-09-18 NOTE — Telephone Encounter (Signed)
TCM Phone Call. Appt is on 09/25/15 at 8:30am w/ Tereso NewcomerScott Weaver [facility]   Thanks

## 2015-09-18 NOTE — Progress Notes (Signed)
CARDIAC REHAB PHASE I   PRE:  Rate/Rhythm: 81 SR  BP:  Supine: 141/91  Sitting:   Standing:    SaO2:   MODE:  Ambulation: 800 ft   POST:  Rate/Rhythm: 95 SR  BP:  Supine:   Sitting: 139/77  Standing:    SaO2:  0800-0855 Pt walked 800 ft with steady gait. No CP . Tolerated well. MI education completed with pt who voiced understanding. Discussed importance of brilinta with stent. Needs to see case manager. Discussed heart healthy diet, ex ed, MI restrictions, and NTG use, risk factors. Discussed CRP 2 and will refer to [place] but pt will be unable to attend as he works early morning to late evening. Very receptive to ed.   Luetta Nuttingharlene Lenia Housley, RN BSN  09/18/2015 8:51 AM

## 2015-09-18 NOTE — Progress Notes (Signed)
Patient Name: [name] Date of Encounter: 09/18/2015  Active Problems:   Unstable angina (HCC)   NSTEMI (non-ST elevated myocardial infarction) [facility](HCC)   Hypertensive heart disease   Hyperlipidemia   Primary Cardiologist:  Patient Profile: [name] is a 50 year old male with a past medical history of HTN. He [facility] the ED at [facility] on 09/16/15 with chest pain with associated SOB and diaphoresis, NSTEMI. Received DES to circumflex.  SUBJECTIVE: Feels well, denies chest pain and SOB.    OBJECTIVE Filed Vitals:   09/17/15 2100 09/17/15 2200 09/17/15 2203 09/18/15 0631  BP: 120/87 128/76 128/76 130/73  Pulse: 74 76 79 68  Temp:    98 F (36.7 C)  TempSrc:    Oral  Resp: 16 18  18   Height:      Weight:    180 lb 12.4 oz (82 kg)  SpO2: 97% 98%  97%    Intake/Output Summary (Last 24 hours) at 09/18/15 0648 Last data filed at 09/18/15 [id number]  Gross per 24 hour  Intake    480 ml  Output    900 ml  Net   -420 ml   Filed Weights   09/16/15 1650 09/16/15 2300 09/18/15 0631  Weight: 180 lb (81.647 kg) 179 lb 6.4 oz (81.375 kg) 180 lb 12.4 oz (82 kg)    PHYSICAL EXAM General: Well developed, well nourished, male in no acute distress. Head: Normocephalic, atraumatic.  Neck: Supple without bruits, no JVD. Lungs:  Resp regular and unlabored, CTA. Heart: RRR, S1, S2, no S3, S4, or murmur; no rub. Abdomen: Soft, non-tender, non-distended, BS + x 4.  Extremities: No clubbing, cyanosis, no edema.  Neuro: Alert and oriented X 3. Moves all extremities spontaneously. Psych: Normal affect.  LABS: CBC: Recent Labs  09/17/15 0441 09/18/15 0535  WBC 10.4 9.4  HGB 13.9 13.2  HCT 41.8 38.8*  MCV 87.6 86.6  PLT 269 253   INR: Recent Labs  09/17/15 2342  INR 1.05   Basic Metabolic Panel: Recent Labs  09/16/15 1706 09/17/15 2342  NA 141  --   K 3.8  --   CL 108  --   CO2 27  --   GLUCOSE 106*  --   BUN 16  --   CREATININE 1.02  --   CALCIUM 9.2   --   MG  --  2.3   Liver Function Tests: Recent Labs  09/17/15 2342  AST 48*  ALT 33  ALKPHOS 67  BILITOT 0.3  PROT 6.9  ALBUMIN 4.2   Cardiac Enzymes: Recent Labs  09/17/15 0441 09/17/15 1314 09/17/15 2342  TROPONINI 8.54* 8.32* 3.14*   BNP:  B NATRIURETIC PEPTIDE  Date/Time Value Ref Range Status  09/17/2015 11:42 PM 23.2 0.0 - 100.0 pg/mL Final   Hemoglobin A1C: Recent Labs  09/17/15 2342  HGBA1C 5.4   Fasting Lipid Panel: Recent Labs  09/17/15 0441  CHOL 217*  HDL 45  LDLCALC 139*  TRIG 164*  CHOLHDL 4.8     Current facility-administered medications:  .  0.9 %  sodium chloride infusion, 250 mL, Intravenous, PRN, [name], MD .  0.9 %  sodium chloride infusion, , Intravenous, Continuous, [name], MD, Last Rate: 150 mL/hr at 09/17/15 1830 .  acetaminophen (TYLENOL) tablet 650 mg, 650 mg, Oral, Q4H PRN, [name], MD, 650 mg at 09/18/15 0555 .  aspirin EC tablet 81 mg, 81 mg, Oral, Daily, Nevin BloodgoodAmir Azeem,  MD, 81 mg at 09/17/15 0848 .  isosorbide mononitrate (IMDUR) 24 hr tablet 30 mg, 30 mg, Oral, Daily, [name], MD, 30 mg at 09/17/15 1840 .  metoprolol tartrate (LOPRESSOR) tablet 12.5 mg, 12.5 mg, Oral, BID, Nevin Bloodgood, MD, 12.5 mg at 09/17/15 2203 .  morphine 2 MG/ML injection 2 mg, 2 mg, Intravenous, Q2H PRN, [name], MD, 2 mg at 09/17/15 1841 .  nitroGLYCERIN (NITROSTAT) SL tablet 0.4 mg, 0.4 mg, Sublingual, Q5 Min x 3 PRN, Nevin Bloodgood, MD .  ondansetron [facility]) injection 4 mg, 4 mg, Intravenous, Q6H PRN, [name], MD, 4 mg at 09/18/15 0555 .  pantoprazole (PROTONIX) EC tablet 40 mg, 40 mg, Oral, Daily, Nevin Bloodgood, MD, 40 mg at 09/17/15 0848 .  sodium chloride flush (NS) 0.9 % injection 3 mL, 3 mL, Intravenous, Q12H, [name], MD, 3 mL at 09/17/15 2204 .  sodium chloride flush (NS) 0.9 % injection 3 mL, 3 mL, Intravenous, PRN, [name], MD .  ticagrelor [facility]) tablet 90 mg, 90 mg, Oral, BID, [name], MD, 90 mg at 09/18/15 0556 . sodium chloride 150 mL/hr at 09/17/15 1830   TELE:  NSR      ECG: NSR   Coronary Stent Intervention 09/17/15 Left Heart Cath and Coronary Angiography   Prox LAD to Mid LAD lesion, 40% stenosed.  Mid LAD to Dist LAD lesion, 50% stenosed.  2nd Mrg lesion, 100% stenosed. Post intervention, there is a 0% residual stenosis.  The left ventricular systolic function is normal.  Dist Cx lesion, 20% stenosed.  Preserved global LV function with an ejection fraction of 55% and evidence for very mild mid inferior hypocontractility.  Two-vessel coronary obstructive disease with 40% proximal LAD stenosis in the region of the first diagonal vessel, and 50% LAD narrowing just after the second diagonal takeoff; moderate size left circumflex coronary artery with 20% narrowing in the OM1 vessel, total occlusion of the OM 2 vessel with TIMI 0 flow antegrade, and 20% distal circumflex stenosis; and a large dominant RCA with very faint collateralization from the PLA vessel to the distal circumflex marginal 2 vessel.  Successful PCI to the totally occluded circumflex OM 2 vessel with ultimate insertion of a 2.514 mm Resolute DES stent postdilated to 2.71 mm with 100% occlusion reduced to 0% and resumption of brisk TIMI-3 flow.  RECOMMENDATION: The patient was switched to brilinta from Plavix in light of presenting with acute coronary syndrome/NSTEMI, he will continue dual antiplatelet therapy for minimum of a year. Medical therapy for mild to moderate concomitant CAD.       Radiology/Studies: Dg Chest 2 View  09/16/2015  CLINICAL DATA:  Acute onset of mid to right-sided chest pain and left shoulder and arm pain. Initial encounter. EXAM: CHEST  2 VIEW COMPARISON:  None. FINDINGS: The lungs are well-aerated. Pulmonary vascularity is at the upper limits of normal. There is no evidence of focal opacification, pleural effusion or pneumothorax. The heart is normal in  size; the mediastinal contour is within normal limits. No acute osseous abnormalities are seen. IMPRESSION: No acute cardiopulmonary process seen. Electronically Signed   By: Roanna Raider M.D.   On: 09/16/2015 17:05     Current Medications:  . aspirin EC  81 mg Oral Daily  . isosorbide mononitrate  30 mg Oral Daily  . metoprolol tartrate  12.5 mg Oral BID  . pantoprazole  40 mg Oral Daily  . sodium chloride flush  3 mL Intravenous  Q12H  . ticagrelor  90 mg Oral BID   . sodium chloride 150 mL/hr at 09/17/15 1830    ASSESSMENT AND PLAN: Active Problems:   Unstable angina (HCC)   NSTEMI (non-ST elevated myocardial infarction) (HCC)   Hypertensive heart disease   Hyperlipidemia   1. NSTEMI: Patient with chest pain began Friday night 09/14/15 while he was sitting at his computer. Describes pain as pressure, like something is sitting on his chest. Also feels a "fluttering". He says his pain was accompanied by profuse diaphoresis. Continued to have pain on Saturday 09/15/15 while walking through a store, had to stop and rest, again with diaphoresis. On Sunday morning 09/16/15, he was awakened from sleep with chest pain and interscapular pain.  Received DES to his Circumflex. He will need DAPT for one year with ASA and Brilinta. He has some residual disease in his LAD. Echo shows EF of 50-55%, with some grade 1 diastolic dysfunction.   2. HLD: LDL is 139 this admission. Was not previously on statin, will continue high intensity statin that was started this admission and check LFT's in 6 weeks.   3. HTN: BP well controlled, he is on 12.5 Metoprolol BID, BP well controlled.     Signed, Little Ishikawa , NP 6:48 AM 09/18/2015 Pager [phone]

## 2015-09-18 NOTE — Progress Notes (Signed)
Patient discharged home with wife after giving discharge instructions.  All questions answered.

## 2015-09-24 NOTE — Progress Notes (Addendum)
Cardiology Office Note:    Date:  09/25/2015   ID:  Victor FennelKendall M Poli, DOB 1966-03-06, MRN [id number]  PCP:  Kirstie PeriSHAH,ASHISH, MD  Cardiologist:  Dr. Chilton Siiffany [place]   Electrophysiologist:  n/a  Referring MD: Kirstie PeriShah, Ashish, MD   Chief Complaint  Patient presents with  . Hospitalization Follow-up    NSTEMI >> PCI; Transitional Care Management appointment     History of Present Illness:    [name] is a 50 y.o. male with a hx of HTN.  Admitted 7/9-7/11 with a non-STEMI. He [facility] emergency room with chest pain and associated dyspnea and diaphoresis. Initial troponin was 0.98. LHC demonstrated preserved LV function and 2 vessel CAD with moderate nonobstructive disease in LAD and Dx and total occlusion of OM2. He underwent PCI with DES to the OM2.  Of note, home dose of Lisinopril was held with normal BP.  Otherwise, post PCI course was uneventful.    Returns for FU.  Here with his wife. He denies recurrent anginal symptoms since discharge. He denies significant dyspnea. Denies orthopnea, PND or edema. Denies syncope. Denies any bleeding issues. 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 Temperatures there sometimes reach 100. He questions whether or not he could go back to work next week as originally planned.  Past Medical History  Diagnosis Date  . GERD (gastroesophageal reflux disease)   . Hypertensive heart disease 09/17/2015  . Hyperlipidemia 09/17/2015  . History of non-ST elevation myocardial infarction (NSTEMI)   . CAD (coronary artery disease)     a. s/p NSTEMI 7/17: pLAD 40, mLAD 50, dLCx 20, OM2 100, EF 55% >> PCI: 2.5 x 14 mm Resolute DES to OM2   . History of echocardiogram     a. Echo 7/17: EF 50-55%, normal wall motion, grade 1 diastolic dysfunction    Past Surgical History  Procedure Laterality Date  . Back surgery    . Rotator cuff repair Right   . Cardiac catheterization N/A 09/17/2015    Procedure: Left Heart Cath and  Coronary Angiography;  Surgeon: [name], MD;  [facility] CV LAB;  Service: Cardiovascular;  Laterality: N/A;  . Cardiac catheterization N/A 09/17/2015    Procedure: Coronary Stent Intervention;  Surgeon: [name], MD;  Location: [facility] INVASIVE CV LAB;  Service: Cardiovascular;  Laterality: N/A;    Current Medications: Outpatient Prescriptions Prior to Visit  Medication Sig Dispense Refill  . aspirin EC 81 MG EC tablet Take 1 tablet (81 mg total) by mouth daily.    Marland Kitchen. atorvastatin (LIPITOR) 80 MG tablet Take 1 tablet (80 mg total) by mouth daily at 6 PM. 30 tablet 12  . HYDROcodone-acetaminophen (NORCO) 7.5-325 MG tablet TAKE 1 TABLET BY MOUTH EVERY EIGHT HOURS AS NEEDED FOR PAIN  0  . metoprolol succinate (TOPROL-XL) 25 MG 24 hr tablet Take 1 tablet (25 mg total) by mouth daily. 30 tablet 12  . Multiple Vitamin (MULTIVITAMIN WITH MINERALS) TABS tablet Take 1 tablet by mouth daily.    . nitroGLYCERIN (NITROSTAT) 0.4 MG SL tablet Place 1 tablet (0.4 mg total) under the tongue every 5 (five) minutes x 3 doses as needed for chest pain. 25 tablet 1  . pantoprazole (PROTONIX) 40 MG tablet Take 1 tablet (40 mg total) by mouth daily. 30 tablet 12  . ticagrelor (BRILINTA) 90 MG TABS tablet Take 1 tablet (90 mg total) by mouth 2 (two) times daily. 60 tablet 12  . ticagrelor (BRILINTA)  90 MG TABS tablet Take 1 tablet (90 mg total) by mouth 2 (two) times daily. 180 tablet 0  . isosorbide mononitrate (IMDUR) 30 MG 24 hr tablet Take 1 tablet (30 mg total) by mouth daily. (Patient not taking: Reported on 09/25/2015) 30 tablet 12   No facility-administered medications prior to visit.      Allergies:   Review of patient's allergies indicates no known allergies.   Social History   Social History  . Marital Status: Married    Spouse Name: N/A  . Number of Children: N/A  . Years of Education: N/A   Social History Main Topics  . Smoking status: Never Smoker   . Smokeless tobacco: Never  Used  . Alcohol Use: No  . Drug Use: No  . Sexual Activity: Not Asked   Other Topics Concern  . None   Social History Narrative     Family History:  The patient's family history includes Heart attack in his brother and father.   ROS:   Please see the history of present illness.    Review of Systems  Constitution: Positive for decreased appetite.  HENT: Positive for headaches.   Cardiovascular: Positive for chest pain and dyspnea on exertion.  Neurological: Positive for dizziness.  Psychiatric/Behavioral: The patient has insomnia.    All other systems reviewed and are negative.   Physical Exam:    VS:  BP 124/80 mmHg  Pulse 72  Ht  (1.702 m)  Wt 173 lb (78.472 kg)  BMI 27.09 kg/m2  SpO2 97%    Wt Readings from Last 3 Encounters:  09/25/15 173 lb (78.472 kg)  09/18/15 180 lb 12.4 oz (82 kg)     Physical Exam  Constitutional: He is oriented to person, place, and time. He appears well-developed and well-nourished.  HENT:  Head: Normocephalic and atraumatic.  Neck: No JVD present.  Cardiovascular: Normal rate, regular rhythm and normal heart sounds.   No murmur heard. Pulmonary/Chest: Effort normal and breath sounds normal. He has no wheezes. He has no rales.  Abdominal: Soft. There is no tenderness.  Musculoskeletal: He exhibits no edema.  right wrist without hematoma or mass   Neurological: He is alert and oriented to person, place, and time.  Skin: Skin is warm and dry.  Psychiatric: He has a normal mood and affect.    Studies/Labs Reviewed:   EKG:  EKG is  ordered today.  The ekg ordered today demonstrates NSR, HR 72, normal axis, QTC 402 ms, no change since prior tracing  Recent Labs: 09/17/2015: ALT 33; B Natriuretic Peptide 23.2; Magnesium 2.3 09/18/2015: BUN 8; Creatinine, Ser 0.64; Hemoglobin 13.2; Platelets 253; Potassium 3.8; Sodium 136   Recent Lipid Panel    Component Value Date/Time   CHOL 217* 09/17/2015 0441   TRIG 164* 09/17/2015 0441    HDL 45 09/17/2015 0441   CHOLHDL 4.8 09/17/2015 0441   VLDL 33 09/17/2015 0441   LDLCALC 139* 09/17/2015 0441    Additional studies/ records that were reviewed today include:   LHC 09/17/15 LAD prox 40%, mid 50% LCx dist 20%, OM2 100% RCA ok EF 55% PCI: 2.5 x 14 mm Resolute DES to OM2 Preserved global LV function with an ejection fraction of 55% and evidence for very mild mid inferior hypocontractility. Two-vessel coronary obstructive disease with 40% proximal LAD stenosis in the region of the first diagonal vessel, and 50% LAD narrowing just after the second diagonal takeoff; moderate size left circumflex coronary artery with 20% narrowing in  the OM1 vessel, total occlusion of the OM 2 vessel with TIMI 0 flow antegrade, and 20% distal circumflex stenosis; and a large dominant RCA with very faint collateralization from the PLA vessel to the distal circumflex marginal 2 vessel. Successful PCI to the totally occluded circumflex OM 2 vessel with ultimate insertion of a 2.514 mm Resolute DES stent postdilated to 2.71 mm with 100% occlusion reduced to 0% and resumption of brisk TIMI-3 flow. RECOMMENDATION: The patient was switched to brilinta from Plavix in light of presenting with acute coronary syndrome/NSTEMI, he will continue dual antiplatelet therapy for minimum of a year. Medical therapy for mild to moderate concomitant CAD.  Echo 09/17/15 EF 50-55%, normal wall motion, grade 1 diastolic dysfunction  ASSESSMENT:    1. NSTEMI (non-ST elevated myocardial infarction) (HCC)   2. Coronary artery disease involving native coronary artery of native heart without angina pectoris   3. Hypertensive heart disease without heart failure   4. Hyperlipidemia    PLAN:    In order of problems listed above:  1. S/p NSTEMI - The patient is s/p NSTEMI tx with DES to OM2.  He is doing well.  We discussed the importance of dual antiplatelet therapy.  He may be able to attend [facility] at [facility].  He is  concerned about the extreme temperatures at his job.   -  Refer to [facility]  -  Continue ASA, Brilinta x 1 year minimum without interruption.    -  Continue high dose statin, beta-blocker.   -  Note given to remain out of work until 10/22/15 with request for frequent breaks 2/2 extreme heat  2. CAD - As noted, he is s/p DES to OM2.  Continue ASA, Brilinta, statin, beta-blocker. FU with Dr. [name] in 2 months.   3. HTN - BP controlled.  Consider adding back ACE inhibitor at some point.  Continue current regimen.   4. HL - Continue Atorva 80.  Check Lipids and LFTs in 6 weeks.    Medication Adjustments/Labs and Tests Ordered: Current medicines are reviewed at length with the patient today.  Concerns regarding medicines are outlined above.  Medication changes, Labs and Tests ordered today are outlined in the Patient Instructions noted below. Patient Instructions  Medication Instructions:  1. Your physician recommends that you continue on your current medications as directed. Please refer to the Current Medication list given to you today. Labwork: 11/08/15 FOR FASTING CHOLESTEROL PANEL; LAB OPENS AT 7:30 AM  Testing/Procedures: NONE Follow-Up: DR. Duke Salvia IN 2 MONTHS Any Other Special Instructions Will Be Listed Below (If Applicable). CARDIAC REHAB AT [facility] If you need a refill on your cardiac medications before your next appointment, please call your pharmacy.   Signed, Tereso Newcomer, PA-C  09/25/2015 12:24 PM    [facility] [address] [place], [place], [place]  [address] Phone: [phone]; Fax: [phone]

## 2015-09-25 ENCOUNTER — Encounter: Payer: Self-pay | Admitting: Physician Assistant

## 2015-09-25 ENCOUNTER — Encounter (INDEPENDENT_AMBULATORY_CARE_PROVIDER_SITE_OTHER): Payer: Self-pay

## 2015-09-25 ENCOUNTER — Ambulatory Visit (INDEPENDENT_AMBULATORY_CARE_PROVIDER_SITE_OTHER): Payer: BLUE CROSS/BLUE SHIELD | Admitting: Physician Assistant

## 2015-09-25 VITALS — BP 124/80 | HR 72 | Ht 67.0 in | Wt 173.0 lb

## 2015-09-25 DIAGNOSIS — I214 Non-ST elevation (NSTEMI) myocardial infarction: Secondary | ICD-10-CM

## 2015-09-25 DIAGNOSIS — I2510 Atherosclerotic heart disease of native coronary artery without angina pectoris: Secondary | ICD-10-CM

## 2015-09-25 DIAGNOSIS — I25110 Atherosclerotic heart disease of native coronary artery with unstable angina pectoris: Secondary | ICD-10-CM | POA: Insufficient documentation

## 2015-09-25 DIAGNOSIS — E785 Hyperlipidemia, unspecified: Secondary | ICD-10-CM

## 2015-09-25 DIAGNOSIS — I119 Hypertensive heart disease without heart failure: Secondary | ICD-10-CM | POA: Diagnosis not present

## 2015-09-25 NOTE — Patient Instructions (Addendum)
Medication Instructions:  1. Your physician recommends that you continue on your current medications as directed. Please refer to the Current Medication list given to you today. Labwork: 11/08/15 FOR FASTING CHOLESTEROL PANEL; LAB OPENS AT 7:30 AM  Testing/Procedures: NONE Follow-Up: DR. Duke SalviaANDOLPH IN 2 MONTHS Any Other Special Instructions Will Be Listed Below (If Applicable). [facility] AT [facility] If you need a refill on your cardiac medications before your next appointment, please call your pharmacy.

## 2015-10-04 ENCOUNTER — Telehealth: Payer: Self-pay | Admitting: Cardiovascular Disease

## 2015-10-04 NOTE — Telephone Encounter (Signed)
Patient came in office with [employer] Forms for Dr Duke Salvia to complete and sign --Short Term Disability and Critical Illness Forms.  Received AUTH/Forms/Pmt for [employer] to process.  Sent to [employer] @ [facility] for processing.  Sent to [employer] via [employer] on 10/04/15. lp

## 2015-10-08 ENCOUNTER — Telehealth: Payer: Self-pay | Admitting: Cardiovascular Disease

## 2015-10-08 NOTE — Telephone Encounter (Signed)
Spoke with pt, aware he will get some bruising while taking brilinta and asa together. He is also c/o SOB, advised pt can be a side effect from brilinta but should improve after taking the medication longer. He can drink some caffeine to help with the SOB. He is also having some dizziness occ when standing. He is going to get a bp monitor today and discussed orthostatic precautions and advised pt to call with bp readings. He reports staying well hydrated. He will call back with any other questions or concerns.

## 2015-10-08 NOTE — Telephone Encounter (Signed)
New Message  Pt c/o medication issue:  1. Name of Medication: belintas  2. How are you currently taking this medication (dosage and times per day)? Every twelve hours  3. Are you having a reaction (difficulty breathing--STAT)? Pt has bruises  4. What is your medication issue? Pt has been taking this medication for three weeks and bruises have appeared on his legs the size of two fifty cent pieces and it's a little sore.

## 2015-10-09 ENCOUNTER — Other Ambulatory Visit: Payer: BLUE CROSS/BLUE SHIELD | Admitting: *Deleted

## 2015-10-09 ENCOUNTER — Encounter (INDEPENDENT_AMBULATORY_CARE_PROVIDER_SITE_OTHER): Payer: Self-pay

## 2015-10-09 DIAGNOSIS — E785 Hyperlipidemia, unspecified: Secondary | ICD-10-CM

## 2015-10-09 LAB — HEPATIC FUNCTION PANEL
ALBUMIN: 4.4 g/dL (ref 3.6–5.1)
ALK PHOS: 63 U/L (ref 40–115)
ALT: 29 U/L (ref 9–46)
AST: 22 U/L (ref 10–35)
BILIRUBIN TOTAL: 0.6 mg/dL (ref 0.2–1.2)
Bilirubin, Direct: 0.1 mg/dL (ref <=0.2)
Indirect Bilirubin: 0.5 mg/dL (ref 0.2–1.2)
TOTAL PROTEIN: 6.6 g/dL (ref 6.1–8.1)

## 2015-10-09 LAB — LIPID PANEL
CHOL/HDL RATIO: 2.8 ratio (ref <=5.0)
Cholesterol: 105 mg/dL — ABNORMAL LOW (ref 125–200)
HDL: 37 mg/dL — AB (ref >=40)
LDL CALC: 51 mg/dL (ref <130)
TRIGLYCERIDES: 87 mg/dL (ref <150)
VLDL: 17 mg/dL (ref <30)

## 2015-10-10 ENCOUNTER — Telehealth: Payer: Self-pay | Admitting: *Deleted

## 2015-10-10 NOTE — Telephone Encounter (Signed)
Pt notified of lab results by phone with verbal understanding. Pt said his BP running about 100/50 or 60 wanted to know if this was too low. I advised pt BP ok, call though if SBP 90 or below; daistolic below 50. Pt agreeable to plan of care.

## 2015-10-11 ENCOUNTER — Telehealth: Payer: Self-pay | Admitting: Cardiovascular Disease

## 2015-10-11 NOTE — Telephone Encounter (Signed)
Received [employer] Critical Illness Form/[employer] Short Term Disability Forms back from [facility] for Dr Duke Salvia to review, complete and sign.  Forms put in Dr Leonides Sake correspondence. lp

## 2015-10-17 ENCOUNTER — Telehealth: Payer: Self-pay | Admitting: Cardiovascular Disease

## 2015-10-17 NOTE — Telephone Encounter (Signed)
Returned call to the patient. He states he is still out of work this week but scheduled to return to work Mon 8/14. He has been having some occasional dizziness, "was walking sideways getting out of my car the other day". BPs and HRs as noted. We discussed his concern that his HR may be a little too low. He notes he's had some occasional SOB too, but had been given advice to drink caffeine w/ his Brilinta and this has helped most of his symptoms.  He notes he still gets occasional 'twinges' but has not required use of nitro. He is compliant w medications as listed.  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  Pt aware I will defer to Dr. Duke Salviaandolph for recommendations on med adjustment, if needed; other suggestions.  Pt shared cell phone # w/ me  - [phone] - he may be reached there if not available at home. Will update demo sheet.

## 2015-10-17 NOTE — Telephone Encounter (Signed)
New message   Pt verbalized that he is still dizzy and he wants the rn to call him beca beause he can not work like this....  Pt c/o Syncope: STAT if syncope occurred within 30 minutes and pt complains of lightheadedness High Priority if episode of passing out, completely, today or in last 24 hours   Did you pass out today? no 1. When is the last time you passed out? no  2. Has this occurred multiple times? Yes dizzy alot  3. Did you have any symptoms prior to passing out? SOB when he is dizzy    105/61   58  97/57   64  108/63    60  111/61   65      103/59    52  104/59   53  103/55   53  112/54    55  96/52   51  111/63    63        10/17/15 before he took the medication

## 2015-10-18 ENCOUNTER — Telehealth: Payer: Self-pay | Admitting: Cardiovascular Disease

## 2015-10-18 ENCOUNTER — Telehealth: Payer: Self-pay | Admitting: *Deleted

## 2015-10-18 ENCOUNTER — Encounter: Payer: Self-pay | Admitting: *Deleted

## 2015-10-18 NOTE — Telephone Encounter (Addendum)
Received call from patient stating he called yesterday regarding some issues he is having and wanted to f/u (see telephone note 8/9).    Pt made aware of MD Duke Salviaandolph recommendations:  Chilton Siiffany [place], MD   2:10 PM  Note    Stop metoprolol for at least 2 days.  This is probably causing both his heart rate and blood pressure to be too low. Continue monitoring BP and heart rate.  If his BP is >130 and heart rate is >70, try taking 12.5mg  of the metoprolol  If he cannot tolerate the lower dose then continue to hold it.     Pt verbalized understanding.  Advised to continue to monitor BP and HR and call Monday with update.    Pt also requesting work note until 8/21 so that he can take his sick days if needed d/t current dizziness.  Letter created and put at front desk for wife to pick up tomorrow.  Pt aware.

## 2015-10-18 NOTE — Telephone Encounter (Signed)
Stop metoprolol for at least 2 days.  This is probably causing both his heart rate and blood pressure to be too low. Continue monitoring BP and heart rate.  If his BP is >130 and heart rate is >70, try taking 12.5mg  of the metoprolol  If he cannot tolerate the lower dose then continue to hold it.

## 2015-10-18 NOTE — Telephone Encounter (Signed)
Follow up    Pt calling for rn pertaining to call yesterday as a follow up

## 2015-10-18 NOTE — Telephone Encounter (Signed)
Hayley A SHarvel Ricksharpe, RN     10/18/15 3:34 PM  Note    Received call from patient stating he called yesterday regarding some issues he is having and wanted to f/u (see telephone note 8/9).    Pt made aware of MD Duke Salviaandolph recommendations:

## 2015-10-18 NOTE — Telephone Encounter (Signed)
Received Signed [employer] Disability Forms back from Dr Duke Salviaandolph.  [facility]  Copies prepared for pick up by patient and put up front check in. lp

## 2015-10-25 ENCOUNTER — Telehealth: Payer: Self-pay | Admitting: Cardiovascular Disease

## 2015-10-25 NOTE — Telephone Encounter (Signed)
Pt c/o SOB, dizziness.  Spoke w/ patient. Notes he had gone off the metoprolol last week due to symptoms, 2 days ago resumed metoprolol at the half tablet (12.5mg  daily) dose due to having systolic BPs >130. Notes BPs running today between 114-116/60-66 w HR 62-64.  Still c/o dizziness. Pt notes dizziness doesn't last long and usually occurs w changing positions (sitting to standing).   Pt asking if OK to return to work Monday as scheduled.  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 He is aware if he feels at all lightheaded or dizzy, he should take a seated or safe supine position until resolved.  Regarding his symptoms of SOB, these he has determined are stemming from Brilinta use, but he has found relief of symptoms w/ drinking caffeinated beverages when he takes med.  Informed patient I will seek further advice from Dr. Duke Salviaandolph.

## 2015-10-25 NOTE — Telephone Encounter (Signed)
New message    Pt c/o Shortness Of Breath: STAT if SOB developed within the last 24 hours or pt is noticeably SOB on the phone  1. Are you currently SOB (can you hear that pt is SOB on the phone)? No   2. How long have you been experiencing SOB? On / off since heart attack - blood pressure was taken off   3. Are you SOB when sitting or when up moving around? Sitting    4. Are you currently experiencing any other symptoms? Dizziness

## 2015-10-29 MED ORDER — CARVEDILOL 3.125 MG PO TABS: 3.1250 mg | ORAL_TABLET | Freq: Two times a day (BID) | ORAL | 3 refills | Status: DC

## 2015-10-29 NOTE — Telephone Encounter (Signed)
Left message to call back at home number No VM available on cell number

## 2015-10-29 NOTE — Telephone Encounter (Signed)
New message  ° ° °Pt verbalized that he is returning call for rn °

## 2015-10-29 NOTE — Telephone Encounter (Signed)
Let's try stopping metoprolol and starting carvedilol 3.125mg  bid.  OK to go back to work.  Agree with hydration and taking his time before moving.  Schedule follow up with one of the APPs within 1 month.

## 2015-10-29 NOTE — Telephone Encounter (Signed)
Advised patient, has follow up with Dr Sharla Kidneyandiolph 11/30/15

## 2015-11-30 ENCOUNTER — Ambulatory Visit (INDEPENDENT_AMBULATORY_CARE_PROVIDER_SITE_OTHER): Payer: BLUE CROSS/BLUE SHIELD | Admitting: Cardiovascular Disease

## 2015-11-30 ENCOUNTER — Encounter: Payer: Self-pay | Admitting: Cardiovascular Disease

## 2015-11-30 VITALS — BP 120/82 | HR 57 | Ht 67.0 in | Wt 171.4 lb

## 2015-11-30 DIAGNOSIS — I10 Essential (primary) hypertension: Secondary | ICD-10-CM

## 2015-11-30 DIAGNOSIS — Z955 Presence of coronary angioplasty implant and graft: Secondary | ICD-10-CM | POA: Diagnosis not present

## 2015-11-30 DIAGNOSIS — E785 Hyperlipidemia, unspecified: Secondary | ICD-10-CM

## 2015-11-30 NOTE — Patient Instructions (Signed)
Medication Instructions:  STOP CARVEDILOL   START BYSTOLIC 2.5 MG DAILY  Labwork: NONE  Testing/Procedures: NONE  Follow-Up: Your physician wants you to follow-up in: July 2018 You will receive a reminder letter in the mail two months in advance. If you don't receive a letter, please call our office to schedule the follow-up appointment.  Any Other Special Instructions Will Be Listed Below (If Applicable). MONITOR YOUR BLOOD PRESSURE AT HOME AND CALL US NEXT WEEK WITH UPDATE ON HOW IT IS RUNNING AND HOW YOU ARE FEELING   If you need a refill on your cardiac medications before your next appointment, please call your pharmacy.

## 2015-11-30 NOTE — Progress Notes (Signed)
Cardiology Office Note   Date:  11/30/2015   ID:  [name], DOB [birth year], MRN [id number]  PCP:  [name], MD  Cardiologist:   [name], MD   No chief complaint on file.     History of Present Illness: [name] is a 50 y.o. male with CAD s/p DES to OM2, hypertension, hyperlipidemia who presents for follow up.  [name] was admitted 09/2015 with NSTEMI and found to have an occluded OM2.  He had a DES placed and had residual 50% LAD and 20% LCx lesions.  He was started on metoprolol, ticagrelor, aspirin and atorvastatin.  LVEF was 50-55%.  After being discharged he had some bradycardia and dizziness, so metoprolol was switched to carvedilol. Since making that change the dizziness has improved. However, he continues to feel fatigued. He walks frequently at work and walks for exercise 3 times per week. He has no chest pain with this activity. However, he doesn't always have the energy to do it.  [name] has limited his salt intake and no longer eats out at restaurants. He is lost 15 pounds since his heart attack.  He denies lower extremity edema, orthopnea, or PND. He has not noted any palpitations and denies syncope.  He has noted shortness of breath at times.  He brings a log of his blood pressure and heart rate, which has ranged from 104-125/48-68.  His heart rate has been in the 60-70s.  Past Medical History:  Diagnosis Date  . CAD (coronary artery disease)    a. s/p NSTEMI 7/17: pLAD 40, mLAD 50, dLCx 20, OM2 100, EF 55% >> PCI: 2.5 x 14 mm Resolute DES to OM2   . GERD (gastroesophageal reflux disease)   . History of echocardiogram    a. Echo 7/17: EF 50-55%, normal wall motion, grade 1 diastolic dysfunction  . History of non-ST elevation myocardial infarction (NSTEMI)   . Hyperlipidemia 09/17/2015  . Hypertensive heart disease 09/17/2015    Past Surgical History:  Procedure Laterality Date  . BACK SURGERY    . CARDIAC CATHETERIZATION N/A 09/17/2015   Procedure: Left Heart Cath and Coronary Angiography;  Surgeon: [name], MD;  Location: [facility] INVASIVE CV LAB;  Service: Cardiovascular;  Laterality: N/A;  . CARDIAC CATHETERIZATION N/A 09/17/2015   Procedure: Coronary Stent Intervention;  Surgeon: [name], MD;  Location: [facility] INVASIVE CV LAB;  Service: Cardiovascular;  Laterality: N/A;  . ROTATOR CUFF REPAIR Right      Current Outpatient Prescriptions  Medication Sig Dispense Refill  . aspirin EC 81 MG EC tablet Take 1 tablet (81 mg total) by mouth daily.    Marland Kitchen atorvastatin (LIPITOR) 80 MG tablet Take 1 tablet (80 mg total) by mouth daily at 6 PM. 30 tablet 12  . Multiple Vitamin (MULTIVITAMIN WITH MINERALS) TABS tablet Take 1 tablet by mouth daily.    . nebivolol (BYSTOLIC) 2.5 MG tablet Take 2.5 mg by mouth daily.    . nitroGLYCERIN (NITROSTAT) 0.4 MG SL tablet Place 1 tablet (0.4 mg total) under the tongue every 5 (five) minutes x 3 doses as needed for chest pain. 25 tablet 1  . pantoprazole (PROTONIX) 40 MG tablet Take 1 tablet (40 mg total) by mouth daily. 30 tablet 12  . ticagrelor (BRILINTA) 90 MG TABS tablet Take 1 tablet (90 mg total) by mouth 2 (two) times daily. 60 tablet 12   No current facility-administered medications for this visit.     Allergies:  Review of patient's allergies indicates no known allergies.    Social History:  The patient  reports that he has never smoked. He has never used smokeless tobacco. He reports that he does not drink alcohol or use drugs.   Family History:  The patient's family history includes Heart attack in his brother and father.    ROS:  Please see the history of present illness.   Otherwise, review of systems are positive for none.   All other systems are reviewed and negative.    PHYSICAL EXAM: VS:  BP 120/82   Pulse (!) 57   Ht 5\' 7"  (1.702 m)   Wt 171 lb 6.4 oz (77.7 kg)   BMI 26.85 kg/m  , BMI Body mass index is 26.85 kg/m. GENERAL:  Well appearing HEENT:  Pupils equal  round and reactive, fundi not visualized, oral mucosa unremarkable NECK:  No jugular venous distention, waveform within normal limits, carotid upstroke brisk and symmetric, no bruits, no thyromegaly LYMPHATICS:  No cervical adenopathy LUNGS:  Clear to auscultation bilaterally HEART:  RRR.  PMI not displaced or sustained,S1 and S2 within normal limits, no S3, no S4, no clicks, no rubs, no murmurs ABD:  Flat, positive bowel sounds normal in frequency in pitch, no bruits, no rebound, no guarding, no midline pulsatile mass, no hepatomegaly, no splenomegaly EXT:  2 plus pulses throughout, no edema, no cyanosis no clubbing SKIN:  No rashes no nodules NEURO:  Cranial nerves II through XII grossly intact, motor grossly intact throughout PSYCH:  Cognitively intact, oriented to person place and time    EKG:  EKG is ordered today. The ekg ordered 11/30/15 demonstrateSinus bradycardia. 57 bpm.  09/18/15:    Prox LAD to Mid LAD lesion, 40% stenosed.  Mid LAD to Dist LAD lesion, 50% stenosed.  2nd Mrg lesion, 100% stenosed. Post intervention, there is a 0% residual stenosis.  The left ventricular systolic function is normal.  Dist Cx lesion, 20% stenosed.  Transthoracic Echocardiography 09/17/15 Study Conclusions  - Left ventricle: The cavity size was normal. Wall thickness was  normal. Systolic function was normal. The estimated ejection  fraction was in the range of 50% to 55%. Wall motion was normal;  there were no regional wall motion abnormalities. Doppler  parameters are consistent with abnormal left ventricular  relaxation (grade 1 diastolic dysfunction).  Recent Labs: 09/17/2015: B Natriuretic Peptide 23.2; Magnesium 2.3 09/18/2015: BUN 8; Creatinine, Ser 0.64; Hemoglobin 13.2; Platelets 253; Potassium 3.8; Sodium 136 10/09/2015: ALT 29    Lipid Panel    Component Value Date/Time   CHOL 105 (L) 10/09/2015 0742   TRIG 87 10/09/2015 0742   HDL 37 (L) 10/09/2015 0742    CHOLHDL 2.8 10/09/2015 0742   VLDL 17 10/09/2015 0742   LDLCALC 51 10/09/2015 0742      Wt Readings from Last 3 Encounters:  11/30/15 171 lb 6.4 oz (77.7 kg)  09/25/15 173 lb (78.5 kg)  09/18/15 180 lb 12.4 oz (82 kg)      ASSESSMENT AND PLAN:  # CAD s/p NSTEMI: [name] is s/p DES in OM2 and he is doing very well. He has no chest pain but does have shortness of breath. I suspect that this is attributable to that R. He has no evidence of heart failure on exam and his echo revealed normal systolic function at the time of the event.  He does complain of fatigue, which is likely due to the beta blocker. His blood pressure is very well-controlled on  low dose carvedilol. I asked him to stop the carvedilol for 2 days to see what his blood pressure does and if the fatigue improves. After that, he will start nebivolol 2.5 mg daily.  He will call us next week with an update on his blood pressures and symptoms. Continue dual antiplatelet therapy with ASA and ticagrelor through 09/2016.  Consider reducing ticagrelor vs discontinuing at that time.   # Hyperlipidemia: Continue atorvastatin.  LDL 51 10/2015.   Current medicines are reviewed at length with the patient today.  The patient does not have concerns regarding medicines.  The following changes have been made:  Stop carvedilol.  Start nebivolol 2.5 mg daily.  Labs/ tests ordered today include:  No orders of the defined types were placed in this encounter.    Disposition:   FU with [place] C. Duke Salvia, MD, [facility] 09/2016.    This note was written with the assistance of speech recognition software.  Please excuse any transcriptional errors.  Signed, Odell Choung C. Duke Salvia, MD, [facility]  11/30/2015 8:27 AM    [facility]

## 2015-12-07 ENCOUNTER — Telehealth: Payer: Self-pay | Admitting: Cardiovascular Disease

## 2015-12-07 NOTE — Telephone Encounter (Signed)
Please call,needs to give you an update on his blood pressure readings. Pt started on a new blood pressure on 11-30-15.

## 2015-12-07 NOTE — Telephone Encounter (Signed)
Spoke to patient and wife.patient states he started BYSTOLIC on 12/03/15 at 6:30 pm.and has been taking for 4 days now. Blood pressure readings 12/01/15  5:30 am  107/68 pulse  68     1 pm       116/64 pulse  65      8 pm      118/81  Pulse 66 9/241/17     7:30 am  106/63 pulse 62       11 am     120/64 pulse 82                       108/67  Pulse 68         7pm      114/62 pulse 67 9/25 /17     4:45 AM   108/65 PULSE 68                    4:00 PM   112/69  PULSE 77          6:30 PM    117/68 PULSE 80 12/06/15        5:30 AM    107/59  PULSE PULSE 60           6:30 PM     107/48 PULSE 69  2 HOURS TAKING MEDICATION 119/66 PULSE 75  patient his blood pressure was not elevated prior to starting medication Will defer to DR RANDOPLH

## 2015-12-07 NOTE — Telephone Encounter (Signed)
How is he feeling?  If the fatigue is better and he isn't lightheaded or dizzy, then keep going with the Bystolic.  Its normal to have a little lightheadedness on the meds when you first stand.  But if it is unbearable or doesn't go away, he should stop it.

## 2015-12-07 NOTE — Telephone Encounter (Signed)
Left message to call back  

## 2015-12-11 ENCOUNTER — Ambulatory Visit (INDEPENDENT_AMBULATORY_CARE_PROVIDER_SITE_OTHER): Payer: BLUE CROSS/BLUE SHIELD | Admitting: Urology

## 2015-12-11 DIAGNOSIS — N281 Cyst of kidney, acquired: Secondary | ICD-10-CM | POA: Diagnosis not present

## 2015-12-18 ENCOUNTER — Telehealth: Payer: Self-pay | Admitting: Cardiovascular Disease

## 2015-12-18 NOTE — Telephone Encounter (Signed)
New message     Patient was told to started a new medication was given sample - patient does not know the name of medication.

## 2015-12-18 NOTE — Telephone Encounter (Signed)
S/w pt states that the fatigue comes and goes and that he has good days most of the time.he is taking 1/2 tablet(2.5mg ) has 2-3 days of the sample medication left. He is wanting to know if he should continue medication (Bystolic) or if he can stop. His BP readings are about as he last reported "last time" (I think it was about 12-07-15). Please advise.

## 2015-12-20 NOTE — Telephone Encounter (Signed)
Please order 2.5 mg tablets.  Blood pressures look good.

## 2015-12-20 NOTE — Telephone Encounter (Signed)
Patient has been having increased shortness of breath, intermittent chest pain,  and increased dizziness over the last couple of days. Took blood pressure this am 107/58 Does take the Bystolic in the evening and has already had tonight Did schedule ov with Bjorn LoserRhonda B PA for 12/24/15 Will forward to Dr Duke Salviaandolph for further review

## 2015-12-24 ENCOUNTER — Ambulatory Visit (INDEPENDENT_AMBULATORY_CARE_PROVIDER_SITE_OTHER): Payer: BLUE CROSS/BLUE SHIELD | Admitting: Student

## 2015-12-24 ENCOUNTER — Encounter: Payer: Self-pay | Admitting: Physician Assistant

## 2015-12-24 VITALS — BP 106/60 | HR 63 | Ht 67.0 in | Wt 169.8 lb

## 2015-12-24 DIAGNOSIS — I2510 Atherosclerotic heart disease of native coronary artery without angina pectoris: Secondary | ICD-10-CM

## 2015-12-24 DIAGNOSIS — E785 Hyperlipidemia, unspecified: Secondary | ICD-10-CM

## 2015-12-24 DIAGNOSIS — R5383 Other fatigue: Secondary | ICD-10-CM | POA: Diagnosis not present

## 2015-12-24 DIAGNOSIS — I959 Hypotension, unspecified: Secondary | ICD-10-CM

## 2015-12-24 MED ORDER — ATORVASTATIN CALCIUM 80 MG PO TABS: 40.0000 mg | ORAL_TABLET | Freq: Every day | ORAL | 12 refills | Status: DC

## 2015-12-24 NOTE — Patient Instructions (Signed)
Medication Instructions:   DECREASE ATORVASTATIN TO 40 MG ONCE DAILY= 1/2 OF THE 80 MG TABLET ONCE DAILY  Follow-Up:  Your physician recommends that you schedule a follow-up appointment in: 4 MONTHS [facility]

## 2015-12-24 NOTE — Progress Notes (Addendum)
Cardiology Office Note    Date:  12/24/2015   ID:  [name], DOB [birth year], MRN [id number]  PCP:  [name], MD  Cardiologist: Dr. Duke Salvia   Chief Complaint  Patient presents with  . Fatigue    History of Present Illness:    [name] is a 50 y.o. male with past medical history of CAD (s/p DES to OM2 in 09/2015), HTN, and HLD who presents to the office today for follow-up of fatigue and dizziness.  Was seen by Dr. Duke Salvia on 9/22 and reported doing well at that time. Did note episodes of bradycardia and dizziness since recent hospitalization. He reported no longer eating out at restaurants and had lost over 15 pounds. With his dizziness, he was asked as hold Carvedilol 3.125mg  BID for 2 days to see how his heart rate responded.  In the interim, he had called with continued episodes of bradycardia and dizziness. His Carvedilol was switched to Bystolic 2.5 mg daily. He took this for several days, but then again noted worsening dizziness and bradycardia with heart rate in the 50's. He says he was instructed to stop Bystolic on Thursday granted this is not in the records.  He has continued to check his blood pressure at home for the past several days and noted systolic readings in the low 100s. Has occasional episodes with SBP of 98. Dizziness can occur while laying down or walking at work. Does not always occur with positional changes. Can last for seconds to minutes. Denies any associated palpitations, chest discomfort, or dyspnea. Says he has continued to feel fatigued. Is increasing activity and walking up to 2 miles daily, but feels tired and fatigued throughout the day.  Has continued to consume less salt and has cut out all fast food. Has lost over 20 pounds since his heart attack in July of this year. Does have occasional episodes of shooting chest pain along his precordium which lasts for seconds. Does not occur with exertion.   Past Medical History:  Diagnosis  Date  . CAD (coronary artery disease)    a. s/p NSTEMI 7/17: pLAD 40, mLAD 50, dLCx 20, OM2 100, EF 55% >> PCI: 2.5 x 14 mm Resolute DES to OM2   . GERD (gastroesophageal reflux disease)   . History of echocardiogram    a. Echo 7/17: EF 50-55%, normal wall motion, grade 1 diastolic dysfunction  . History of non-ST elevation myocardial infarction (NSTEMI)   . Hyperlipidemia 09/17/2015  . Hypertensive heart disease 09/17/2015    Past Surgical History:  Procedure Laterality Date  . BACK SURGERY    . CARDIAC CATHETERIZATION N/A 09/17/2015   Procedure: Left Heart Cath and Coronary Angiography;  Surgeon: [name], MD;  Location: [facility] INVASIVE CV LAB;  Service: Cardiovascular;  Laterality: N/A;  . CARDIAC CATHETERIZATION N/A 09/17/2015   Procedure: Coronary Stent Intervention;  Surgeon: [name], MD;  Location: [facility] INVASIVE CV LAB;  Service: Cardiovascular;  Laterality: N/A;  . ROTATOR CUFF REPAIR Right     Current Medications: Outpatient Medications Prior to Visit  Medication Sig Dispense Refill  . aspirin EC 81 MG EC tablet Take 1 tablet (81 mg total) by mouth daily.    . Multiple Vitamin (MULTIVITAMIN WITH MINERALS) TABS tablet Take 1 tablet by mouth daily.    . nitroGLYCERIN (NITROSTAT) 0.4 MG SL tablet Place 1 tablet (0.4 mg total) under the tongue every 5 (five) minutes x 3 doses as needed for chest pain. 25 tablet 1  .  pantoprazole (PROTONIX) 40 MG tablet Take 1 tablet (40 mg total) by mouth daily. 30 tablet 12  . ticagrelor (BRILINTA) 90 MG TABS tablet Take 1 tablet (90 mg total) by mouth 2 (two) times daily. 60 tablet 12  . atorvastatin (LIPITOR) 80 MG tablet Take 1 tablet (80 mg total) by mouth daily at 6 PM. 30 tablet 12  . nebivolol (BYSTOLIC) 2.5 MG tablet Take 2.5 mg by mouth daily.     No facility-administered medications prior to visit.      Allergies:   Review of patient's allergies indicates no known allergies.   Social History   Social History  . Marital  status: Married    Spouse name: N/A  . Number of children: N/A  . Years of education: N/A   Social History Main Topics  . Smoking status: Never Smoker  . Smokeless tobacco: Never Used  . Alcohol use No  . Drug use: No  . Sexual activity: Not Asked   Other Topics Concern  . None   Social History Narrative  . None     Family History:  The patient's family history includes Heart attack in his brother and father.   Review of Systems:   Please see the history of present illness.     General:  No chills, fever, night sweats or weight changes. Positive for generalized fatigue. Cardiovascular:  No chest pain, dyspnea on exertion, edema, orthopnea, palpitations, paroxysmal nocturnal dyspnea. Dermatological: No rash, lesions/masses Respiratory: No cough, dyspnea Urologic: No hematuria, dysuria Abdominal:   No nausea, vomiting, diarrhea, bright red blood per rectum, melena, or hematemesis Neurologic:  No visual changes, wkns, changes in mental status. Positive for dizziness. All other systems reviewed and are otherwise negative except as noted above.   Physical Exam:    VS:  BP 106/60 (BP Location: Right Arm, Patient Position: Sitting, Cuff Size: Normal)   Pulse 63   Ht 5\' 7"  (1.702 m)   Wt 169 lb 12.8 oz (77 kg)   BMI 26.59 kg/m    General: Well developed, well nourished,male appearing in no acute distress. Head: Normocephalic, atraumatic, sclera non-icteric, no xanthomas, nares are without discharge.  Neck: No carotid bruits. JVD not elevated.  Lungs: Respirations regular and unlabored, without wheezes or rales.  Heart: Regular rate and rhythm. No S3 or S4.  No murmur, no rubs, or gallops appreciated. Abdomen: Soft, non-tender, non-distended with normoactive bowel sounds. No hepatomegaly. No rebound/guarding. No obvious abdominal masses. Msk:  Strength and tone appear normal for age. No joint deformities or effusions. Extremities: No clubbing or cyanosis. No edema.  Distal  pedal pulses are 2+ bilaterally. Neuro: Alert and oriented X 3. Moves all extremities spontaneously. No focal deficits noted. Psych:  Responds to questions appropriately with a normal affect. Skin: No rashes or lesions noted  Wt Readings from Last 3 Encounters:  12/24/15 169 lb 12.8 oz (77 kg)  11/30/15 171 lb 6.4 oz (77.7 kg)  09/25/15 173 lb (78.5 kg)     Studies/Labs Reviewed:   EKG:  EKG is ordered today.  The ekg ordered today demonstrates NSR, HR 63, with no ST or T-wave changes.  Recent Labs: 09/17/2015: B Natriuretic Peptide 23.2; Magnesium 2.3 09/18/2015: BUN 8; Creatinine, Ser 0.64; Hemoglobin 13.2; Platelets 253; Potassium 3.8; Sodium 136 10/09/2015: ALT 29   Lipid Panel    Component Value Date/Time   CHOL 105 (L) 10/09/2015 0742   TRIG 87 10/09/2015 0742   HDL 37 (L) 10/09/2015 0742   CHOLHDL 2.8  10/09/2015 0742   VLDL 17 10/09/2015 0742   LDLCALC 51 10/09/2015 0742    Additional studies/ records that were reviewed today include:   Cardiac Catheterization: 09/2015  Prox LAD to Mid LAD lesion, 40% stenosed.  Mid LAD to Dist LAD lesion, 50% stenosed.  2nd Mrg lesion, 100% stenosed. Post intervention, there is a 0% residual stenosis.  The left ventricular systolic function is normal.  Dist Cx lesion, 20% stenosed.   Preserved global LV function with an ejection fraction of 55% and evidence for very mild mid inferior hypocontractility.  Two-vessel coronary obstructive disease with 40% proximal LAD stenosis in the region of the first diagonal vessel, and 50% LAD narrowing just after the second diagonal takeoff; moderate size left circumflex coronary artery with 20% narrowing in the OM1 vessel, total occlusion of the OM 2 vessel with TIMI 0 flow antegrade, and 20% distal circumflex stenosis; and a large dominant RCA with very faint collateralization from the PLA vessel to the distal circumflex marginal 2 vessel.  Successful PCI to the totally occluded circumflex  OM 2 vessel with ultimate insertion of a 2.514 mm Resolute DES stent postdilated to 2.71 mm with 100% occlusion reduced to 0% and resumption of brisk TIMI-3 flow.  RECOMMENDATION: The patient was switched to brilinta from Plavix in light of presenting with acute coronary syndrome/NSTEMI, he will continue dual antiplatelet therapy for minimum of a year.  Medical therapy for mild to moderate concomitant CAD.  Assessment:    1. Fatigue, unspecified type   2. Coronary artery disease involving native coronary artery of native heart without angina pectoris   3. Hypotension, unspecified hypotension type   4. Hyperlipidemia, unspecified hyperlipidemia type      Plan:   In order of problems listed above:  1. Fatigue/ Dizziness - Has reported this has been worse since his MI in 09/2015. Beta blocker has been switched from Metoprolol-XL to Coreg 3.125 mg BID. This was then switched to Bystolic 2.5 mg daily. He continued to experience dizziness and bradycardia, therefore this was discontinued last Thursday (12/20/2015). - HR has been in the mid-50's to 60's since and SBP has been in high-90's to low-100's. He is now off all antihypertensive medications. - he continues to have fatigue. Says this started upon inception of medications following his MI. It is unclear what his fatigue is secondary to. BP is 106/60 and HR at 63 during today's visit. I offered today to check his thyroid panel and CBC for anemia, but he declined as he has an annual physical with his PCP in the next month and a half and wishes to have all labs checked at that time. Lipid Panel in August 2017 showed an LDL of 51, reduced from 139 in 09/2015. Will decrease Lipitor from 80mg  daily to 40mg  daily to see if this helps with his fatigue. Will need repeat Lipid panel and LFTs at the time of his annual physical in 02/2016.  2. CAD - s/p DES to OM2 in 09/2015. EKG today is without acute ischemic changes.  - Does report episodes of  occasional shooting chest pain, lasting only seconds with no exertional chest discomfort or dyspnea. Overall, this seems noncardiac in etiology. - Continue dual antiplatelet therapy with ASA and Brilinta. Continue statin therapy with reduced dosing as above. No BB at this time secondary to BB and hypotension.   3. Hypotension - Reports systolic blood pressure readings in the 90s to low 100s at home. Stopped Bystolic just 4 days ago. Not currently  on any antihypertensive medications. BP at 106/60 during today's visit. - If hypotension continues, could consider trial of Midodrine in the future.  4. HLD - will reduce statin dosing as above from Atorvastatin 80mg  daily to 40mg  daily. - Will need repeat Lipid Panel and LFTs in 2 months.    Medication Adjustments/Labs and Tests Ordered: Current medicines are reviewed at length with the patient today.  Concerns regarding medicines are outlined above.  Medication changes, Labs and Tests ordered today are listed in the Patient Instructions below. Patient Instructions  Medication Instructions:   DECREASE ATORVASTATIN TO 40 MG ONCE DAILY= 1/2 OF THE 80 MG TABLET ONCE DAILY  Follow-Up: Your physician recommends that you schedule a follow-up appointment in: 4 MONTHS WITH DR [facility]    Signed, Ellsworth Lennox, PA  12/24/2015 5:14 PM    [facility] [address], [address] [place], [place]  [address] Phone: [phone]; Fax: [phone]  [address], [address] [place], [place] [address] Phone: [phone]

## 2015-12-28 NOTE — Telephone Encounter (Signed)
Patient seen by PA on 12/24/15

## 2016-01-17 ENCOUNTER — Telehealth: Payer: Self-pay | Admitting: Cardiovascular Disease

## 2016-01-17 NOTE — Telephone Encounter (Signed)
Patient dropped off surgical clearance form for knee surgery.  [facility].

## 2016-01-21 ENCOUNTER — Telehealth: Payer: Self-pay | Admitting: *Deleted

## 2016-01-21 NOTE — Telephone Encounter (Signed)
Request for surgical clearance:  1. What type of surgery is being performed? Total Knee Replacement    2. When is this surgery scheduled? TBD   3. Are there any medications that need to be held prior to surgery and how long?Brilinta and ASA   4. Name of physician performing surgery? Dr Jene EveryJeffrey Beane    5. What is your office phone and fax number? Phone [phone]     Fax [phone]   Will forward to Dr Duke Salviaandolph for review

## 2016-02-04 NOTE — Telephone Encounter (Signed)
Sent via Epic, patient aware

## 2016-02-04 NOTE — Telephone Encounter (Signed)
Mr. [name] had an NSTEMI with stents placed 09/2015.  He needs to be on dual antiplatelet therapy without interruption for one year before having elective surgeries.

## 2016-02-12 ENCOUNTER — Other Ambulatory Visit: Payer: Self-pay | Admitting: Urology

## 2016-02-12 DIAGNOSIS — N281 Cyst of kidney, acquired: Secondary | ICD-10-CM

## 2016-02-15 ENCOUNTER — Ambulatory Visit (HOSPITAL_COMMUNITY)
Admission: RE | Admit: 2016-02-15 | Discharge: 2016-02-15 | Disposition: A | Discharge status: Home / Self Care | Payer: BLUE CROSS/BLUE SHIELD | Source: Ambulatory Visit | Attending: Urology | Admitting: Urology

## 2016-02-15 DIAGNOSIS — N281 Cyst of kidney, acquired: Secondary | ICD-10-CM | POA: Diagnosis present

## 2016-04-04 ENCOUNTER — Telehealth: Payer: Self-pay | Admitting: Cardiovascular Disease

## 2016-04-04 ENCOUNTER — Ambulatory Visit (INDEPENDENT_AMBULATORY_CARE_PROVIDER_SITE_OTHER): Payer: BLUE CROSS/BLUE SHIELD | Admitting: Cardiology

## 2016-04-04 ENCOUNTER — Encounter: Payer: Self-pay | Admitting: Cardiology

## 2016-04-04 VITALS — BP 132/78 | HR 65 | Ht 67.0 in | Wt 162.0 lb

## 2016-04-04 DIAGNOSIS — I25110 Atherosclerotic heart disease of native coronary artery with unstable angina pectoris: Secondary | ICD-10-CM

## 2016-04-04 DIAGNOSIS — Z955 Presence of coronary angioplasty implant and graft: Secondary | ICD-10-CM

## 2016-04-04 DIAGNOSIS — E785 Hyperlipidemia, unspecified: Secondary | ICD-10-CM

## 2016-04-04 DIAGNOSIS — R079 Chest pain, unspecified: Secondary | ICD-10-CM

## 2016-04-04 DIAGNOSIS — I214 Non-ST elevation (NSTEMI) myocardial infarction: Secondary | ICD-10-CM | POA: Diagnosis not present

## 2016-04-04 DIAGNOSIS — I119 Hypertensive heart disease without heart failure: Secondary | ICD-10-CM

## 2016-04-04 MED ORDER — METOPROLOL TARTRATE 25 MG PO TABS: 12.5000 mg | ORAL_TABLET | Freq: Two times a day (BID) | ORAL | 6 refills | Status: DC

## 2016-04-04 MED ORDER — ISOSORBIDE MONONITRATE ER 30 MG PO TB24: 30.0000 mg | ORAL_TABLET | Freq: Every day | ORAL | 6 refills | Status: DC

## 2016-04-04 NOTE — Assessment & Plan Note (Signed)
On moderate atorvastatin reduced to 40 mg from the original 80 mg

## 2016-04-04 NOTE — Assessment & Plan Note (Signed)
I'm a little concerned of his new onset of symptoms. The chest pain symptoms don't sound very cardiac, but the dyspnea worse with exertion is concerning. My initial inclination would be to consider cardiac catheterization as he is well within the first year was PCI and his symptoms are very concerning. On however he is not on any medications and is not sure when to take when necessary nitroglycerin. Was short lasting pain this is a difficult thing understand.  Plan:   Start low-dose metoprolol 12 mouth twice a day and Imdur 30 mg daily.  Follow symptoms over the weekend. If symptoms worsen, and he has the tightness and squeezing sensation, he is to call 911 and go to the ER  If symptoms improve over the weekend and he is more stable, we'll plan Myoview stress test next week, however if symptoms are persistent to the weekend just not worse, we will plan for cardiac catheterization on Tuesday.  We will contact him on Monday

## 2016-04-04 NOTE — Assessment & Plan Note (Signed)
History of non-STEMI with some atypical symptoms leading up to truly classic anginal chest squeezing pressure sensation. A lot of his atypical symptoms began as fleeting sharp chest pains with dyspnea worse with exertion in the day leading up to his MI. He is now having similar symptoms with the exception of the tightness and squeezing. Plan ischemic evaluation depending on how he feels after starting medical management.

## 2016-04-04 NOTE — Assessment & Plan Note (Signed)
Remains on aspirin and Brilinta. I talked about taking Brilinta with caffeine, but he has been on it for long time and is probably not the cause of his new onset dyspnea.

## 2016-04-04 NOTE — Patient Instructions (Addendum)
START METOPROLOL TARTRATE 12.5 MG ( 1/2 TABLET OF 25 MG ) TWICE A DAY.  START IMDUR 30 MG ( ISOSORBIDE  MONO) TAKE AT BEDTIME, 30 MIN AFTER TAKING ASPIRIN 81 MG DAILY.    CALL [facility] on Monday- if symptoms does not subside will schedule a cardiac catheterization - if symptoms are better continue with exercise myoview.   SCHEDULE NEXT WEEK  [address]  Your physician has requested that you have en exercise stress myoview. For further information please visit [email or web address]. Please follow instruction sheet, as given.    Your physician wants you to follow-up in after test is completed.

## 2016-04-04 NOTE — Assessment & Plan Note (Signed)
Hard to believe he has a true diagnosis of hypertension since he is not on any blood pressure medications and his blood pressure was controlled. He does have enough blood pressure room to potentially start a beta blocker with a resting heart rate of 65 BPM. He didn't do well on low-dose carvedilol or Bystolic in the past, but I think low-dose beta blocker would probably cause less side effects.   Start metoprolol 12.5 twice a day along with Imdur 30 mg

## 2016-04-04 NOTE — Progress Notes (Signed)
PCP: [name], MD  CARDIOLOGIST: Dr. [name]  Clinic Note: Chief Complaint  Patient presents with  . Follow-up  . Shortness of Breath    randomly.; Worse with exertion  . Chest Pain    sharp pain coming and going.     HPI: [name] is a 51 y.o. male with a PMH below who presents today for Work in urgent follow-up for new onset chest pain and dyspnea similar to his pre-MI symptoms. He had a non-ST elevation MI back in July 2017 was found to have an occluded OM 2 treated with a DES stent. His symptoms that time were somewhat atypical and he waited all weekend long prior to presenting. He basically had nagging sharp chest discomfort as well as shortness of breath that would be fleeting episodes not necessarily exertional. It wasn't until he started having a crushing chest tightness that he will actually went to the hospital.  [name] was last seen on 12/24/2015 by Randall An, PA. (He was initially seen post hospital by Dr. Duke Salvia on September 22 - his beta blocker dose was held for couple days. He was switched to Bystolic 2.5) he is now no longer on beta blocker.  Recent Hospitalizations: None since  Studies Reviewed:   Cath 09/17/2015 for NSTEMI: Stent Resolute Integ 2.5x14 to OM2  Diagnostic Diagram        Post-Intervention Diagram              2-D Echocardiogram 09/17/2015: Normal LV size and thickness. EF 50-55%. No regional wall motion abnormality. GR 1 DD.  Interval History: Ace presents here today as a work in for recurrent chest pain episodes associated with dyspnea. He has been doing fine ever since he last saw [place] up until last night when he started having several episodes of sharp chest pain just to the left of midline. He also noted episodes of dyspnea often associated with a chest pain but definitely worse with exertion. He did not notice the chest tightness or pressure yet. He also said these episodes of pain only lasted about  a minute or 2, so he was not sure if he needed take nitroglycerin. He was concerned, because these of the similar symptoms that he felt in the day leading up to his MI. He also has had a headache and some neck and back pain that is also associated with when he had his MI. Another concerning symptoms that he has had some fluttering in his chest starting yesterday off and on that he also felt when he had his MI. He has not had any rapid irregular heartbeats or any syncope or near syncope. He has had some mild swelling but nothing significant. He initially thought that some of his shortness of breath was related to Brilinta, but has been on Brilinta for long time. He is not had any bleeding issues associated with it.  No melena, hematochezia or hematuria.   No claudication.  ROS: A comprehensive was performed. Review of Systems  Constitutional: Positive for weight loss. Negative for malaise/fatigue.  HENT: Negative for congestion, hearing loss and nosebleeds.   Respiratory: Positive for shortness of breath. Negative for cough and wheezing.   Cardiovascular: Positive for chest pain and palpitations.       Otherwise per history of present illness  Gastrointestinal: Negative for abdominal pain, constipation, heartburn and nausea.  Genitourinary: Negative.   Musculoskeletal: Positive for joint pain (Knee pain).  Neurological: Positive for dizziness (Occasional positional).  Psychiatric/Behavioral: Negative.  All other systems reviewed and are negative.   Past Medical History:  Diagnosis Date  . CAD S/P percutaneous coronary angioplasty    a. s/p NSTEMI 7/17:  EF 55%. pLAD 40%, mLAD 50%, dLCx 20%, OM2 100%,>> PCI: 2.5 x 14 mm Resolute DES to OM2   . GERD (gastroesophageal reflux disease)   . History of non-ST elevation myocardial infarction (NSTEMI)    Noted to have occluded OM 2 treated with DES stent  . Hyperlipidemia 09/17/2015  . Hypertensive heart disease 09/17/2015    Past Surgical  History:  Procedure Laterality Date  . BACK SURGERY    . CARDIAC CATHETERIZATION N/A 09/17/2015   Procedure: Left Heart Cath and Coronary Angiography;  Surgeon: [name], MD;  Location: [facility] INVASIVE CV LAB;  Service: Cardiovascular: EF 55%. pLAD 40%, mLAD 50%, dLCx 20%, OM2 100%,>> PCI: 2.5 x 14 mm Resolute DES to OM2   . CARDIAC CATHETERIZATION N/A 09/17/2015   Procedure: Coronary Stent Intervention;  Surgeon: [name], MD;  Location: [facility] INVASIVE CV LAB;  Service: Cardiovascular:  PCI: 2.5 x 14 mm Resolute DES to OM2   . ROTATOR CUFF REPAIR Right   . TRANSTHORACIC ECHOCARDIOGRAM  09/17/2015   Normal LV size and thickness. EF 50-55%. No regional wall motion abnormality. GR 1 DD.    Current Meds  Medication Sig  . aspirin EC 81 MG EC tablet Take 1 tablet (81 mg total) by mouth daily.  Marland Kitchen atorvastatin (LIPITOR) 80 MG tablet Take 0.5 tablets (40 mg total) by mouth daily at 6 PM.  . FLUARIX QUADRIVALENT 0.5 ML injection TO BE ADMINISTERED BY PHARMACIST FOR IMMUNIZATION  . Multiple Vitamin (MULTIVITAMIN WITH MINERALS) TABS tablet Take 1 tablet by mouth daily.  . nitroGLYCERIN (NITROSTAT) 0.4 MG SL tablet Place 1 tablet (0.4 mg total) under the tongue every 5 (five) minutes x 3 doses as needed for chest pain.  . pantoprazole (PROTONIX) 40 MG tablet Take 1 tablet (40 mg total) by mouth daily.  . ticagrelor (BRILINTA) 90 MG TABS tablet Take 1 tablet (90 mg total) by mouth 2 (two) times daily.    No Known Allergies  Social History   Social History  . Marital status: Married    Spouse name: N/A  . Number of children: N/A  . Years of education: N/A   Social History Main Topics  . Smoking status: Never Smoker  . Smokeless tobacco: Never Used  . Alcohol use No  . Drug use: No  . Sexual activity: Not Asked   Other Topics Concern  . None   Social History Narrative  . None    family history includes Heart attack in his brother and father.  Wt Readings from Last 3 Encounters:    04/04/16 73.5 kg (162 lb)  12/24/15 77 kg (169 lb 12.8 oz)  11/30/15 77.7 kg (171 lb 6.4 oz)    PHYSICAL EXAM BP 132/78   Pulse 65   Ht 5\' 7"  (1.702 m)   Wt 73.5 kg (162 lb)   BMI 25.37 kg/m  General appearance: alert, cooperative, appears stated age, no distress and Well-nourished-well-groomed. Neck: no adenopathy, no carotid bruit and no JVD Lungs: clear to auscultation bilaterally, normal percussion bilaterally and non-labored Heart: regular rate and rhythm, S1& S2 normal, no murmur, click, rub or gallop; nondisplaced PMI Abdomen: soft, non-tender; bowel sounds normal; no masses,  no organomegaly; no HJR Extremities: extremities normal, atraumatic, no cyanosis, or edema Pulses: 2+ and symmetric;  Skin: mobility and turgor normal,  no edema, no evidence of bleeding or bruising and no lesions noted Neurologic: Mental status: Alert, oriented, thought content appropriate Cranial nerves: normal (II-XII grossly intact)   Adult ECG Report  Rate: 65 ;  Rhythm: normal sinus rhythm and Normal axis, intervals and durations;   Narrative Interpretation: normal EKG   Other studies Reviewed: Additional studies/ records that were reviewed today include:  Recent Labs:   Lab Results  Component Value Date   CREATININE 0.64 09/18/2015   BUN 8 09/18/2015   NA 136 09/18/2015   K 3.8 09/18/2015   CL 103 09/18/2015   CO2 25 09/18/2015      ASSESSMENT / PLAN: Problem List Items Addressed This Visit    Coronary artery disease involving native coronary artery of native heart with unstable angina pectoris (HCC) (Chronic)    I'm a little concerned of his new onset of symptoms. The chest pain symptoms don't sound very cardiac, but the dyspnea worse with exertion is concerning. My initial inclination would be to consider cardiac catheterization as he is well within the first year was PCI and his symptoms are very concerning. On however he is not on any medications and is not sure when to take when  necessary nitroglycerin. Was short lasting pain this is a difficult thing understand.  Plan:   Start low-dose metoprolol 12 mouth twice a day and Imdur 30 mg daily.  Follow symptoms over the weekend. If symptoms worsen, and he has the tightness and squeezing sensation, he is to call 911 and go to the ER  If symptoms improve over the weekend and he is more stable, we'll plan Myoview stress test next week, however if symptoms are persistent to the weekend just not worse, we will plan for cardiac catheterization on Tuesday.  We will contact him on Monday      Relevant Medications   isosorbide mononitrate (IMDUR) 30 MG 24 hr tablet   metoprolol tartrate (LOPRESSOR) 25 MG tablet   Hyperlipidemia with target low density lipoprotein (LDL) cholesterol less than 70 mg/dL (Chronic)    On moderate atorvastatin reduced to 40 mg from the original 80 mg      Relevant Medications   isosorbide mononitrate (IMDUR) 30 MG 24 hr tablet   metoprolol tartrate (LOPRESSOR) 25 MG tablet   NSTEMI (non-ST elevated myocardial infarction) (HCC) (Chronic)    History of non-STEMI with some atypical symptoms leading up to truly classic anginal chest squeezing pressure sensation. A lot of his atypical symptoms began as fleeting sharp chest pains with dyspnea worse with exertion in the day leading up to his MI. He is now having similar symptoms with the exception of the tightness and squeezing. Plan ischemic evaluation depending on how he feels after starting medical management.      Relevant Medications   isosorbide mononitrate (IMDUR) 30 MG 24 hr tablet   metoprolol tartrate (LOPRESSOR) 25 MG tablet   Hypertensive heart disease (Chronic)    Hard to believe he has a true diagnosis of hypertension since he is not on any blood pressure medications and his blood pressure was controlled. He does have enough blood pressure room to potentially start a beta blocker with a resting heart rate of 65 BPM. He didn't do well on  low-dose carvedilol or Bystolic in the past, but I think low-dose beta blocker would probably cause less side effects.   Start metoprolol 12.5 twice a day along with Imdur 30 mg       Relevant Medications   isosorbide  mononitrate (IMDUR) 30 MG 24 hr tablet   metoprolol tartrate (LOPRESSOR) 25 MG tablet   Status post coronary artery stent placement (Chronic)    Remains on aspirin and Brilinta. I talked about taking Brilinta with caffeine, but he has been on it for long time and is probably not the cause of his new onset dyspnea.       Other Visit Diagnoses    Chest pain with high risk for cardiac etiology    -  Primary   Relevant Orders   EKG 12-Lead   Myocardial Perfusion Imaging      Current medicines are reviewed at length with the patient today. (+/- concerns) when to take PRN NTG The following changes have been made: see below  Patient Instructions  START METOPROLOL TARTRATE 12.5 MG ( 1/2 TABLET OF 25 MG ) TWICE A DAY.  START IMDUR 30 MG ( ISOSORBIDE  MONO) TAKE AT BEDTIME, 30 MIN AFTER TAKING ASPIRIN 81 MG DAILY.    CALL The [facility] on Monday- if symptoms does not subside will schedule a cardiac catheterization - if symptoms are better continue with exercise myoview.   SCHEDULE NEXT WEEK  [address]  Your physician has requested that you have en exercise stress myoview. For further information please visit [email or web address] Please follow instruction sheet, as given.    Your physician wants you to follow-up in after test is completed.     Studies Ordered:   Orders Placed This Encounter  Procedures  . Myocardial Perfusion Imaging  . EKG 12-Lead      Bryan Lemmaavid Harding, M.D., M.S. Interventional Cardiologist   Pager # [phone] Phone # [phone] [address] [place], [place] [id number]

## 2016-04-04 NOTE — Telephone Encounter (Signed)
Received call from patient he stated he started having chest pain radiating into back and sob yesterday off and on.Stated he is not having chest pain at present but alittle sob.Stated he would like to be seen today.Stated he felt like this before he had his heart attack.Spoke to DOD Dr.Harding appointment scheduled with him this afternoon at 3:15 pm.Advised to go to ER if chest pain returns.

## 2016-04-07 ENCOUNTER — Telehealth: Payer: Self-pay | Admitting: *Deleted

## 2016-04-07 NOTE — Telephone Encounter (Signed)
Late entry Patient called earlier today states he will not be able to do cath tomorrow or stress test this week due to he is IN [place] all week until Thursday. No chest discomfort over the weekend , except  Some late yesterday did not use NTG.  INFORMATION GIVEN TO DR Herbie BaltimoreHarding.  RESCHEDULE MYOVIEW FOR NEXT WEEK .  PATIENT AWARE.

## 2016-04-08 ENCOUNTER — Telehealth (HOSPITAL_COMMUNITY): Payer: Self-pay

## 2016-04-08 ENCOUNTER — Telehealth: Payer: Self-pay | Admitting: Cardiology

## 2016-04-08 NOTE — Telephone Encounter (Signed)
Encounter complete. 

## 2016-04-08 NOTE — Telephone Encounter (Signed)
Called the patients home number as cell VM was not set up.  Left message with new date of stress test of 04-15-16 at 7:30 a.m.Marland Kitchen.  Left my name and number to call back with questions.

## 2016-04-09 ENCOUNTER — Inpatient Hospital Stay (HOSPITAL_COMMUNITY): Admission: RE | Admit: 2016-04-09 | Payer: BLUE CROSS/BLUE SHIELD | Source: Ambulatory Visit

## 2016-04-10 ENCOUNTER — Telehealth (HOSPITAL_COMMUNITY): Payer: Self-pay

## 2016-04-10 ENCOUNTER — Other Ambulatory Visit: Payer: Self-pay | Admitting: *Deleted

## 2016-04-10 MED ORDER — TICAGRELOR 90 MG PO TABS: 90.0000 mg | ORAL_TABLET | Freq: Two times a day (BID) | ORAL | 6 refills | Status: DC

## 2016-04-10 NOTE — Telephone Encounter (Signed)
Encounter complete. 

## 2016-04-11 ENCOUNTER — Telehealth: Payer: Self-pay | Admitting: Cardiovascular Disease

## 2016-04-11 MED ORDER — TICAGRELOR 90 MG PO TABS: 90.0000 mg | ORAL_TABLET | Freq: Two times a day (BID) | ORAL | 3 refills | Status: DC

## 2016-04-11 NOTE — Telephone Encounter (Signed)
Rx(s) sent to pharmacy electronically.  

## 2016-04-11 NOTE — Telephone Encounter (Signed)
New Message      *STAT* If patient is at the pharmacy, call can be transferred to refill team.   1. Which medications need to be refilled? (please list name of each medication and dose if known)   ticagrelor (BRILINTA) 90 MG TABS tablet Take 1 tablet (90 mg total) by mouth 2 (two) times daily.     2. Which pharmacy/location (including street and city if local pharmacy) is medication to be sent to?  [facility]   3. Do they need a 30 day or 90 day supply? 90   Needs a 3 month supply called in, her coupon covers a 3 month supply not the 30 days, for the same price as 30 days

## 2016-04-15 ENCOUNTER — Inpatient Hospital Stay (HOSPITAL_COMMUNITY): Admission: RE | Admit: 2016-04-15 | Payer: BLUE CROSS/BLUE SHIELD | Source: Ambulatory Visit

## 2016-04-28 NOTE — Progress Notes (Signed)
Cardiology Office Note   Date:  04/30/2016   ID:  [name], DOB Oct 08, 1965, MRN [id number]    PCP:  Kirstie PeriSHAH,ASHISH, MD  Cardiologist:   Chilton Siiffany [place], MD   Chief Complaint  Patient presents with  . Follow-up    4 months; Pt states no Sx.      History of Present Illness: [name] is a 51 y.o. male with CAD s/p DES to OM2, hyperlipidemia who presents for follow up.  [name] was admitted 09/2015 with NSTEMI and found to have an occluded OM2.  He had a DES placed and had residual 50% LAD and 20% LCx lesions.  He was started on metoprolol, ticagrelor, aspirin and atorvastatin.  LVEF was 50-55%.  After being discharged he had some bradycardia and dizziness, so metoprolol was switched to carvedilol. Since making that change the dizziness has improved.  At follow up he complained of fatigue and was switched to nebivolol.  However this was stopped due to dizziness and fatigue.  He Dr. Herbie BaltimoreHarding in clinic 04/04/16 due to chest pain.  He was started on metoprolol and Imdur and referred for exercise Myoview.    Since that appointment he switch metoprolol to once daily as his blood pressure was getting too low with twice-daily dosing. He also stopped Imdur due to headache.  Prior to that appointment he reported several days of sharp substernal chest pain that occurred mostly at rest. This was associated with shortness of breath and diaphoresis. He also had diffuse ecchymoses. Since this 3 days he has not experienced any recurrent chest pain or pressure. He also denies lower extremity edema, orthopnea, or PND. He continues to exercise by walking for 1 mile up the hills in his neighborhood and has no chest pain or shortness of breath with this activity. He also walks 3-4 miles daily at work.    Past Medical History:  Diagnosis Date  . CAD S/P percutaneous coronary angioplasty    a. s/p NSTEMI 7/17:  EF 55%. pLAD 40%, mLAD 50%, dLCx 20%, OM2 100%,>> PCI: 2.5 x 14 mm Resolute DES to OM2     . GERD (gastroesophageal reflux disease)   . History of non-ST elevation myocardial infarction (NSTEMI)    Noted to have occluded OM 2 treated with DES stent  . Hyperlipidemia 09/17/2015  . Hypertensive heart disease 09/17/2015    Past Surgical History:  Procedure Laterality Date  . BACK SURGERY    . CARDIAC CATHETERIZATION N/A 09/17/2015   Procedure: Left Heart Cath and Coronary Angiography;  Surgeon: [name], MD;  [facility];  Service: Cardiovascular: EF 55%. pLAD 40%, mLAD 50%, dLCx 20%, OM2 100%,>> PCI: 2.5 x 14 mm Resolute DES to OM2   . CARDIAC CATHETERIZATION N/A 09/17/2015   Procedure: Coronary Stent Intervention;  Surgeon: [name], MD;  [facility];  Service: Cardiovascular:  PCI: 2.5 x 14 mm Resolute DES to OM2   . ROTATOR CUFF REPAIR Right   . TRANSTHORACIC ECHOCARDIOGRAM  09/17/2015   Normal LV size and thickness. EF 50-55%. No regional wall motion abnormality. GR 1 DD.     Current Outpatient Prescriptions  Medication Sig Dispense Refill  . aspirin EC 81 MG EC tablet Take 1 tablet (81 mg total) by mouth daily.    Marland Kitchen. atorvastatin (LIPITOR) 80 MG tablet Take 0.5 tablets (40 mg total) by mouth daily at 6 PM. 30 tablet 12  . FLUARIX QUADRIVALENT 0.5 ML injection TO  BE ADMINISTERED BY PHARMACIST FOR IMMUNIZATION  0  . isosorbide mononitrate (IMDUR) 30 MG 24 hr tablet Take 1 tablet (30 mg total) by mouth daily. 30 tablet 6  . Multiple Vitamin (MULTIVITAMIN WITH MINERALS) TABS tablet Take 1 tablet by mouth daily.    . pantoprazole (PROTONIX) 40 MG tablet Take 1 tablet (40 mg total) by mouth daily. 30 tablet 12  . ticagrelor (BRILINTA) 90 MG TABS tablet Take 1 tablet (90 mg total) by mouth 2 (two) times daily. 180 tablet 3  . metoprolol succinate (TOPROL XL) 25 MG 24 hr tablet 1/2 TABLET BY MOUTH DAILY 45 tablet 3   No current facility-administered medications for this visit.     Allergies:   Patient has no known allergies.     Social History:  The patient  reports that he has never smoked. He has never used smokeless tobacco. He reports that he does not drink alcohol or use drugs.   Family History:  The patient's family history includes Heart attack in his brother and father.    ROS:  Please see the history of present illness.   Otherwise, review of systems are positive for fatigue.   All other systems are reviewed and negative.    PHYSICAL EXAM: VS:  BP 118/76   Pulse (!) 48   Ht 5' 7.5" (1.715 m)   Wt 78.4 kg (172 lb 12.8 oz)   BMI 26.66 kg/m  , BMI Body mass index is 26.66 kg/m. GENERAL:  Well appearing HEENT:  Pupils equal round and reactive, fundi not visualized, oral mucosa unremarkable NECK:  No jugular venous distention, waveform within normal limits, carotid upstroke brisk and symmetric, no bruits LYMPHATICS:  No cervical adenopathy LUNGS:  Clear to auscultation bilaterally HEART:  RRR.  PMI not displaced or sustained,S1 and S2 within normal limits, no S3, no S4, no clicks, no rubs, no murmurs ABD:  Flat, positive bowel sounds normal in frequency in pitch, no bruits, no rebound, no guarding, no midline pulsatile mass, no hepatomegaly, no splenomegaly EXT:  2 plus pulses throughout, no edema, no cyanosis no clubbing SKIN:  No rashes no nodules NEURO:  Cranial nerves II through XII grossly intact, motor grossly intact throughout PSYCH:  Cognitively intact, oriented to person place and time    EKG:  EKG is not ordered today. The ekg ordered 11/30/15 demonstrateSinus bradycardia. 57 bpm.  09/18/15:    Prox LAD to Mid LAD lesion, 40% stenosed.  Mid LAD to Dist LAD lesion, 50% stenosed.  2nd Mrg lesion, 100% stenosed. Post intervention, there is a 0% residual stenosis.  The left ventricular systolic function is normal.  Dist Cx lesion, 20% stenosed.  Transthoracic Echocardiography 09/17/15 Study Conclusions  - Left ventricle: The cavity size was normal. Wall thickness was   normal. Systolic function was normal. The estimated ejection  fraction was in the range of 50% to 55%. Wall motion was normal;  there were no regional wall motion abnormalities. Doppler  parameters are consistent with abnormal left ventricular  relaxation (grade 1 diastolic dysfunction).  Recent Labs: 09/17/2015: B Natriuretic Peptide 23.2; Magnesium 2.3 09/18/2015: BUN 8; Creatinine, Ser 0.64; Hemoglobin 13.2; Platelets 253; Potassium 3.8; Sodium 136 10/09/2015: ALT 29   02/16/16: Sodium 143, potassium 4.6, BUN 18, creatinine 0.93 WBC 5.8, hemoglobin 15.1, hematocrit 45.4, platelets 300 Total cholesterol 129, trig glycerides 55, HDL 51, HDL 67 AST 20, ALT 24 TSH 5.78  Lipid Panel    Component Value Date/Time   CHOL 105 (L) 10/09/2015 4696  TRIG 87 10/09/2015 0742   HDL 37 (L) 10/09/2015 0742   CHOLHDL 2.8 10/09/2015 0742   VLDL 17 10/09/2015 0742   LDLCALC 51 10/09/2015 0742      Wt Readings from Last 3 Encounters:  04/30/16 78.4 kg (172 lb 12.8 oz)  04/04/16 73.5 kg (162 lb)  12/24/15 77 kg (169 lb 12.8 oz)      ASSESSMENT AND PLAN:  # CAD s/p NSTEMI: [name] is s/p DES in OM2 and he is doing very well.  He had 3 days of substernal chest pain that was nonexertional. This has since subsided and he is able to exercise without symptoms. However, given his history we will obtain an exercise Myoview to ensure that he does not have any ischemia. Due to his low blood pressures we will reduce metoprolol to 12.5 mg of metoprolol succinate daily. He denies dizziness at this time. Continue aspirin and ticagrelor through 09/2016, at which time tiagrelor can be discontinued.  He wishes to have knee replacement surgery at that time.  Stop Imdur as it was causing headaches.  # Hyperlipidemia: Continue atorvastatin.  LDL 67 12//2017.   Current medicines are reviewed at length with the patient today.  The patient does not have concerns regarding medicines.  The following changes have  been made: Switch metoprolol to 12.5mg  daily.  Stop Imdur.  Labs/ tests ordered today include:  No orders of the defined types were placed in this encounter.    Disposition:   FU with Aloni Chuang C. Duke Salvia, MD, [facility] 09/2016.    This note was written with the assistance of speech recognition software.  Please excuse any transcriptional errors.  Signed, Anea Fodera C. Duke Salvia, MD, [facility]  04/30/2016 9:15 AM    [facility]

## 2016-04-30 ENCOUNTER — Ambulatory Visit (INDEPENDENT_AMBULATORY_CARE_PROVIDER_SITE_OTHER): Payer: BLUE CROSS/BLUE SHIELD | Admitting: Cardiovascular Disease

## 2016-04-30 ENCOUNTER — Other Ambulatory Visit: Payer: Self-pay | Admitting: *Deleted

## 2016-04-30 ENCOUNTER — Encounter: Payer: Self-pay | Admitting: Cardiovascular Disease

## 2016-04-30 VITALS — BP 118/76 | HR 48 | Ht 67.5 in | Wt 172.8 lb

## 2016-04-30 DIAGNOSIS — E785 Hyperlipidemia, unspecified: Secondary | ICD-10-CM | POA: Diagnosis not present

## 2016-04-30 DIAGNOSIS — Z955 Presence of coronary angioplasty implant and graft: Secondary | ICD-10-CM

## 2016-04-30 DIAGNOSIS — R079 Chest pain, unspecified: Secondary | ICD-10-CM

## 2016-04-30 MED ORDER — METOPROLOL SUCCINATE ER 25 MG PO TB24: ORAL_TABLET | ORAL | 3 refills | Status: DC

## 2016-04-30 NOTE — Patient Instructions (Signed)
Medication Instructions:  STOP METOPROLOL TARTRATE   START METOPROLOL SUCC 25 MG 1/2 TABLET DAILY   Labwork: NONE  Testing/Procedures: Your physician has requested that you have en exercise stress myoview. For further information please visit [email or web address]. Please follow instruction sheet, as given. RESCHEDULE   Follow-Up: Your physician recommends that you schedule a follow-up appointment in: IN July   If you need a refill on your cardiac medications before your next appointment, please call your pharmacy.

## 2016-05-06 ENCOUNTER — Telehealth (HOSPITAL_COMMUNITY): Payer: Self-pay

## 2016-05-06 NOTE — Telephone Encounter (Signed)
Encounter complete. 

## 2016-05-09 ENCOUNTER — Ambulatory Visit (HOSPITAL_COMMUNITY)
Admission: RE | Admit: 2016-05-09 | Discharge: 2016-05-09 | Disposition: A | Discharge status: Home / Self Care | Payer: BLUE CROSS/BLUE SHIELD | Source: Ambulatory Visit | Attending: Cardiovascular Disease | Admitting: Cardiovascular Disease

## 2016-05-09 DIAGNOSIS — R079 Chest pain, unspecified: Secondary | ICD-10-CM

## 2016-05-09 LAB — MYOCARDIAL PERFUSION IMAGING
CHL CUP NUCLEAR SDS: 0
CHL CUP RESTING HR STRESS: 68 {beats}/min
CSEPPHR: 100 {beats}/min
LV sys vol: 51 mL
LVDIAVOL: 113 mL (ref 62–150)
NUC STRESS TID: 1.09
SRS: 0
SSS: 0

## 2016-05-09 MED ORDER — REGADENOSON 0.4 MG/5ML IV SOLN
0.4000 mg | Freq: Once | INTRAVENOUS | Status: AC
  Administered 2016-05-09: 0.4 mg via INTRAVENOUS

## 2016-05-09 MED ORDER — AMINOPHYLLINE 25 MG/ML IV SOLN
75.0000 mg | Freq: Once | INTRAVENOUS | Status: AC
  Administered 2016-05-09: 75 mg via INTRAVENOUS

## 2016-05-09 MED ORDER — TECHNETIUM TC 99M TETROFOSMIN IV KIT
10.9000 | PACK | Freq: Once | INTRAVENOUS | Status: AC | PRN
  Administered 2016-05-09: 10.9 via INTRAVENOUS
  Filled 2016-05-09: qty 11

## 2016-05-09 MED ORDER — TECHNETIUM TC 99M TETROFOSMIN IV KIT
31.3000 | PACK | Freq: Once | INTRAVENOUS | Status: AC | PRN
  Administered 2016-05-09: 31.3 via INTRAVENOUS
  Filled 2016-05-09: qty 32

## 2016-05-13 NOTE — Progress Notes (Signed)
Stress Test looked good!! No sign of significant Heart Artery Disease.  Pump function is normal.  Good news!!.  HARDING,DAVID W, MD  Pls forward to PCP: Dr. Kirstie PeriAshish Shah

## 2016-05-14 ENCOUNTER — Telehealth: Payer: Self-pay | Admitting: *Deleted

## 2016-05-14 NOTE — Telephone Encounter (Signed)
-----   Message from Marykay Lexavid W Harding, MD sent at 05/13/2016 10:17 PM EST ----- Stress Test looked good!! No sign of significant Heart Artery Disease.  Pump function is normal.  Good news!!.  HARDING,DAVID W, MD  Pls forward to PCP: Dr. Kirstie PeriAshish Shah

## 2016-05-14 NOTE — Telephone Encounter (Signed)
Left message on home number to call back for results.  unable to leave message on cell phone voice message-has not been set up

## 2016-05-14 NOTE — Telephone Encounter (Signed)
Follow up ° ° °Pt returning phone call °

## 2016-05-15 ENCOUNTER — Telehealth: Payer: Self-pay | Admitting: Cardiovascular Disease

## 2016-05-15 NOTE — Telephone Encounter (Signed)
New message     Pt is returning Sharons call from 05/14/16 for test results if you get his voicemail leave him a complete message

## 2016-05-15 NOTE — Telephone Encounter (Signed)
I spoke to patient and advised of Myoview results. He voiced understanding and appreciation for call.  I also advised him to keep f/u appt. He voiced understanding.

## 2016-05-16 NOTE — Telephone Encounter (Signed)
SEE OTHER TELEPHONE MESSAGE

## 2016-08-27 ENCOUNTER — Telehealth: Payer: Self-pay | Admitting: Cardiovascular Disease

## 2016-08-27 NOTE — Telephone Encounter (Signed)
New message    Pt is calling asking for a call back from Rn about his medications.

## 2016-08-27 NOTE — Telephone Encounter (Signed)
Spoke with pt, his insurance has changed and he will have to pay $280 for a months worth of brilinta. He is to stop the brilinta the end of July and wanted to know if he could stop it now due to the change in the cost. He has a follow up appointment end of July with dr Duke Salviarandolph. Will forward to dr Duke Salviarandolph for her review and advise

## 2016-08-27 NOTE — Telephone Encounter (Signed)
Has enough to last until 09/09/16 Samples at front for pick up to last through 7/10 Patient aware

## 2016-08-27 NOTE — Telephone Encounter (Signed)
I prefer that he take it through 09/16/2016.  If he runs out before then there are 2 option: 1- samples 2- I think there are new co-pay cards with a reduced fee for commercial patients.

## 2016-09-17 ENCOUNTER — Telehealth: Payer: Self-pay | Admitting: Cardiovascular Disease

## 2016-09-17 MED ORDER — TICAGRELOR 90 MG PO TABS: 90.0000 mg | ORAL_TABLET | Freq: Two times a day (BID) | ORAL | 1 refills | Status: DC

## 2016-09-17 NOTE — Telephone Encounter (Signed)
Spoke to wife of patient. He has enough Brilinta on-hand to get through Friday.  Has OV w Duke Salviaandolph on 7/30.  Currently med is costing them $240/month which is straining on their budget. Wife asking if he can stop taking/be put on something else. She's aware I've checked, and we are currently out of sample stock of this medication.  Informed wife I'd route to MD to review and see if med alternative advised. She voiced understanding and thanks.

## 2016-09-17 NOTE — Telephone Encounter (Signed)
Left msg to call, informed in msg that I have sent refill in and that this would need to be continued unless discontinued by Dr. Duke Salviaandolph. Requested return call to discuss.

## 2016-09-17 NOTE — Telephone Encounter (Signed)
New message      Pt c/o medication issue:  1. Name of Medication: brilinta  2. How are you currently taking this medication (dosage and times per day)? 90mg  daily 3. Are you having a reaction (difficulty breathing--STAT)? no 4. What is your medication issue? Pt was given samples.  He is almost out.  Calling to see if he will need a presc or can he stop taking medication after samples is out

## 2016-09-17 NOTE — Telephone Encounter (Signed)
Left msg to call.

## 2016-09-17 NOTE — Telephone Encounter (Signed)
F/U Call:  Patient wife returning your call.

## 2016-09-17 NOTE — Telephone Encounter (Signed)
It has been one year so he can stop Brilinta.  Keep taking aspirin 81mg  daily.

## 2016-09-19 NOTE — Telephone Encounter (Signed)
Spoke with pt wife, aware to stop brinlinta and continue aspirin.

## 2016-10-06 ENCOUNTER — Encounter: Payer: Self-pay | Admitting: Cardiovascular Disease

## 2016-10-06 ENCOUNTER — Ambulatory Visit (INDEPENDENT_AMBULATORY_CARE_PROVIDER_SITE_OTHER): Payer: BLUE CROSS/BLUE SHIELD | Admitting: Cardiovascular Disease

## 2016-10-06 VITALS — BP 120/75 | HR 67 | Ht 67.0 in | Wt 180.6 lb

## 2016-10-06 DIAGNOSIS — I119 Hypertensive heart disease without heart failure: Secondary | ICD-10-CM

## 2016-10-06 DIAGNOSIS — E785 Hyperlipidemia, unspecified: Secondary | ICD-10-CM

## 2016-10-06 DIAGNOSIS — Z955 Presence of coronary angioplasty implant and graft: Secondary | ICD-10-CM | POA: Diagnosis not present

## 2016-10-06 DIAGNOSIS — I214 Non-ST elevation (NSTEMI) myocardial infarction: Secondary | ICD-10-CM

## 2016-10-06 NOTE — Progress Notes (Signed)
Cardiology Office Note   Date:  10/06/2016   ID:  Victor FennelKendall M Gleaves, DOB Nov 02, 1965, MRN [id number]    PCP:  Kirstie PeriShah, Ashish, MD  Cardiologist:   Chilton Siiffany Mad River, MD   Chief Complaint  Patient presents with  . Follow-up  . Dizziness    when getting up to fast.  . Shortness of Breath    occasionally.     History of Present Illness: [name] is a 51 y.o. male with CAD s/p DES to OM2, hyperlipidemia who presents for follow up.  [name] was admitted 09/2015 with NSTEMI and found to have an occluded OM2.  He had a DES placed and had residual 50% LAD and 20% LCx lesions.  He was started on metoprolol, ticagrelor, aspirin and atorvastatin.  LVEF was 50-55%.  After being discharged he had some bradycardia and dizziness, so metoprolol was switched to carvedilol. Since making that change the dizziness has improved.  At follow up he complained of fatigue and was switched to nebivolol.  However this was stopped due to dizziness and fatigue.  He Dr. Herbie BaltimoreHarding in clinic 04/04/16 due to chest pain.  He was started on metoprolol and Imdur and referred for exercise Myoview 05/2016 revealed LVEF 55% and no ischemia. He was unable to tolerate Imdur 2/2 headaches.  Since his last appointment [name] has been doing well. He has been exercising regularly and has no chest pain or shortness of breath.  He has occasional episodes of sharp chest pain. These typically occur at rest and lasts for several minutes at a time. He also continues to have intermittent shortness of breath that has been ongoing since the time of his heart attack. This also does not occur with exertion. He also denies lower extremity edema, orthopnea, or PND.  He is planning to have surgery on his knee and is when he will be cleared to have this procedure.  Past Medical History:  Diagnosis Date  . CAD S/P percutaneous coronary angioplasty    a. s/p NSTEMI 7/17:  EF 55%. pLAD 40%, mLAD 50%, dLCx 20%, OM2 100%,>> PCI: 2.5 x 14 mm  Resolute DES to OM2   . GERD (gastroesophageal reflux disease)   . History of non-ST elevation myocardial infarction (NSTEMI)    Noted to have occluded OM 2 treated with DES stent  . Hyperlipidemia 09/17/2015  . Hypertensive heart disease 09/17/2015    Past Surgical History:  Procedure Laterality Date  . BACK SURGERY    . CARDIAC CATHETERIZATION N/A 09/17/2015   Procedure: Left Heart Cath and Coronary Angiography;  Surgeon: [name], MD;  [facility];  Service: Cardiovascular: EF 55%. pLAD 40%, mLAD 50%, dLCx 20%, OM2 100%,>> PCI: 2.5 x 14 mm Resolute DES to OM2   . CARDIAC CATHETERIZATION N/A 09/17/2015   Procedure: Coronary Stent Intervention;  Surgeon: [name], MD;  [facility];  Service: Cardiovascular:  PCI: 2.5 x 14 mm Resolute DES to OM2   . ROTATOR CUFF REPAIR Right   . TRANSTHORACIC ECHOCARDIOGRAM  09/17/2015   Normal LV size and thickness. EF 50-55%. No regional wall motion abnormality. GR 1 DD.     Current Outpatient Prescriptions  Medication Sig Dispense Refill  . aspirin EC 81 MG EC tablet Take 1 tablet (81 mg total) by mouth daily.    Marland Kitchen. atorvastatin (LIPITOR) 80 MG tablet Take 0.5 tablets (40 mg total) by mouth daily at 6 PM. 30 tablet 12  .  metoprolol succinate (TOPROL XL) 25 MG 24 hr tablet 1/2 TABLET BY MOUTH DAILY 45 tablet 3  . Multiple Vitamin (MULTIVITAMIN WITH MINERALS) TABS tablet Take 1 tablet by mouth daily.     No current facility-administered medications for this visit.     Allergies:   Patient has no known allergies.    Social History:  The patient  reports that he has never smoked. He has never used smokeless tobacco. He reports that he does not drink alcohol or use drugs.   Family History:  The patient's family history includes Heart attack in his brother and father.    ROS:  Please see the history of present illness.   Otherwise, review of systems are positive for fatigue.   All other systems are reviewed  and negative.    PHYSICAL EXAM: VS:  BP 120/75   Pulse 67   Ht 5\' 7"  (1.702 m)   Wt 81.9 kg (180 lb 9.6 oz)   BMI 28.29 kg/m  , BMI Body mass index is 28.29 kg/m. GENERAL:  Well appearing HEENT:  Pupils equal round and reactive, fundi not visualized, oral mucosa unremarkable NECK:  No jugular venous distention, waveform within normal limits, carotid upstroke brisk and symmetric, no bruits, no thyromegaly LUNGS:  Clear to auscultation bilaterally.  No crackles, wheezes or rhonchi HEART:  RRR.  PMI not displaced or sustained,S1 and S2 within normal limits, no S3, no S4, no clicks, no rubs, no murmurs ABD:  Flat, positive bowel sounds normal in frequency in pitch, no bruits, no rebound, no guarding, no midline pulsatile mass, no hepatomegaly, no splenomegaly EXT:  2 plus pulses throughout, no edema, no cyanosis no clubbing SKIN:  No rashes no nodules NEURO:  Cranial nerves II through XII grossly intact, motor grossly intact throughout PSYCH:  Cognitively intact, oriented to person place and time   EKG:  EKG is not ordered today. The ekg ordered 11/30/15 demonstrateSinus bradycardia. 57 bpm.  Exercise Myoveiw 05/09/16: The left ventricular ejection fraction is normal (55-65%).  Nuclear stress EF: 55%.  There was no ST segment deviation noted during stress.  The study is normal.  This is a low risk study.   Normal stress nuclear study with no ischemia or infarction; EF 55 with normal wall motion. 09/18/15:    Prox LAD to Mid LAD lesion, 40% stenosed.  Mid LAD to Dist LAD lesion, 50% stenosed.  2nd Mrg lesion, 100% stenosed. Post intervention, there is a 0% residual stenosis.  The left ventricular systolic function is normal.  Dist Cx lesion, 20% stenosed.  Transthoracic Echocardiography 09/17/15 Study Conclusions  - Left ventricle: The cavity size was normal. Wall thickness was  normal. Systolic function was normal. The estimated ejection  fraction was in the range  of 50% to 55%. Wall motion was normal;  there were no regional wall motion abnormalities. Doppler  parameters are consistent with abnormal left ventricular  relaxation (grade 1 diastolic dysfunction).  Recent Labs: 10/09/2015: ALT 29   02/16/16: Sodium 143, potassium 4.6, BUN 18, creatinine 0.93 WBC 5.8, hemoglobin 15.1, hematocrit 45.4, platelets 300 Total cholesterol 129, trig glycerides 55, HDL 51, HDL 67 AST 20, ALT 24 TSH 6.211.18  Lipid Panel    Component Value Date/Time   CHOL 105 (L) 10/09/2015 0742   TRIG 87 10/09/2015 0742   HDL 37 (L) 10/09/2015 0742   CHOLHDL 2.8 10/09/2015 0742   VLDL 17 10/09/2015 0742   LDLCALC 51 10/09/2015 0742      Wt Readings from  Last 3 Encounters:  10/06/16 81.9 kg (180 lb 9.6 oz)  05/09/16 78 kg (172 lb)  04/30/16 78.4 kg (172 lb 12.8 oz)      ASSESSMENT AND PLAN:  # CAD s/p NSTEMI: [name] is s/p DES in OM2 09/2015.  He continues to do well.  He has atypical chest pain that is not related to ischemia. He has no exertional symptoms. Continue aspirin, metoprolol, and atorvastatin.  We will switch his sublingual nitrogen to spray to be taken as needed.  # Pre-surgical risk assessment:  [name] is at acceptable risk for knee surgery. OK to hold aspirin for 3 days prior to surgery.  # Hyperlipidemia: Continue atorvastatin.  LDL 67 12//2017.  He wants to know if he can stop atorvastatin.  We discussed the fact that the goal is for him to have an LDL less than 70. It is unlikely that this will happen if he stops atorvastatin. He plans to switch to a more plant-based diet. He will run out of his prescription in 2 months. After that time he will not take atorvastatin for 2 months and will have his lipids checked with his PCP at his appointment in 4 months. If his LDL is greater than 70, he will restart it.  Current medicines are reviewed at length with the patient today.  The patient does not have concerns regarding medicines.  The following  changes have been made: none Labs/ tests ordered today include:  No orders of the defined types were placed in this encounter.    Disposition:   FU with Tramaine Snell C. Duke Salvia, MD, [facility] in 1 year  This note was written with the assistance of speech recognition software.  Please excuse any transcriptional errors.  Signed, Fransisca Shawn C. Duke Salvia, MD, [facility]  10/06/2016 5:38 PM    [facility]

## 2016-10-06 NOTE — Patient Instructions (Signed)

## 2016-11-24 ENCOUNTER — Telehealth: Payer: Self-pay | Admitting: *Deleted

## 2016-11-24 NOTE — Telephone Encounter (Signed)
Patient dropped off surgical clearance from Dr Shelle Iron. Sent Dr Leonides Sake last office note from 10/06/2016 that states he is cleared for surgery Left message to call back

## 2016-11-26 NOTE — Telephone Encounter (Signed)
Advised patient information has been sent to Dr Shelle Iron

## 2016-12-09 ENCOUNTER — Other Ambulatory Visit: Payer: Self-pay | Admitting: Cardiovascular Disease

## 2016-12-09 NOTE — Telephone Encounter (Signed)
Please review for refill. Thanks!  

## 2016-12-24 ENCOUNTER — Ambulatory Visit: Payer: Self-pay | Admitting: Orthopedic Surgery

## 2017-02-04 ENCOUNTER — Ambulatory Visit: Payer: Self-pay | Admitting: Orthopedic Surgery

## 2017-02-04 NOTE — H&P (Signed)
[name] DOB: 02-04-1966 Married / Language: [place] / Race: White Male  H&P Date: 02/04/17  Chief Complaint: Left knee pain  History of Present Illness The patient is a 51 year old male who comes in today for a preoperative History and Physical. The patient is scheduled for a left total knee arthroplasty to be performed by Dr. Javier DockerJeffrey C. Beane[facility] 02/12/[facility]. [name] reports chronic progressively worsening L knee pain and instability interfering with ADLs and quality of life. It is refractory to steroid injections, viscosupplementation, bracing, home exercise program, quad strengthening, pain medications, activity modifications and relative rest. He desires to proceed with surgery at this point.  Dr. Shelle [name] and the patient mutually agreed to proceed with a total knee replacement. Risks and benefits of the procedure were discussed including stiffness, suboptimal range of motion, persistent pain, infection requiring removal of prosthesis and reinsertion, need for prophylactic antibiotics in the future, for example, dental procedures, possible need for manipulation, revision in the future and also anesthetic complications including DVT, PE, etc. We discussed the perioperative course, time in the hospital, postoperative recovery and the need for elevation to control swelling. We also discussed the predicted range of motion and the probability that squatting and kneeling would be unobtainable in the future. In addition, postoperative anticoagulation was discussed. We have obtained preoperative medical clearance as necessary. Provided illustrated handout and discussed it in detail. They will enroll in the total joint replacement educational forum at the hospital.  Problem List/Past Medical Hx Thumb pain, right (M79.644)  Chronic pain of left knee (M25.562)  Primary osteoarthritis of both knees (M17.0)  Lumbar DDD High blood pressure  Myocardial infarction    Allergies  No Known Drug Allergies [01/09/2016]:  Family History Diabetes Mellitus  father First Degree Relatives  reported Heart Disease  Heart disease in male family member before age 53  Hypertension   Social History  Tobacco use  01/09/2016 Alcohol use  former drinker Children  2 Current work status  working full time Drug/Alcohol Rehab (Currently)  no Drug/Alcohol Rehab (Previously)  no Exercise  Exercises weekly; does running / walking and gym / weights Illicit drug use  no Living situation  live with spouse Marital status  married Never consumed alcohol  01/09/2016: Never consumed alcohol No history of drug/alcohol rehab  Not under pain contract  Number of flights of stairs before winded  4-5 Pain Contract  no Tobacco / smoke exposure  01/09/2016: no  Medications Lipitor (Oral) Specific strength unknown - Active. Aspirin (81MG  Tablet, Oral) Active. blood pressure med Active. Medications Reconciled  Past Surgical History Heart Stents  Spinal Surgery - lumbar fusion  Physical Exam General Mental Status -Alert, cooperative and good historian. General Appearance-pleasant, Not in acute distress. Orientation-Oriented X3. Build & Nutrition-Well nourished and Well developed.  Head and Neck Head-normocephalic, atraumatic . Neck Global Assessment - supple, no bruit auscultated on the right, no bruit auscultated on the left.  Eye Pupil - Bilateral-Regular and Round. Motion - Bilateral-EOMI.  Chest and Lung Exam Auscultation Breath sounds - clear at anterior chest wall and clear at posterior chest wall. Adventitious sounds - No Adventitious sounds.  Cardiovascular Auscultation Rhythm - Regular rate and rhythm. Heart Sounds - S1 WNL and S2 WNL. Murmurs & Other Heart Sounds - Auscultation of the heart reveals - No Murmurs.  Abdomen Palpation/Percussion Tenderness - Abdomen is non-tender to palpation. Rigidity  (guarding) - Abdomen is soft. Auscultation Auscultation of the abdomen reveals - Bowel sounds  normal.  Male Genitourinary Not done, not pertinent to present illness  Musculoskeletal He is well nourished, well developed, awake, alert, oriented x3, has an antalgic gait. Bilateral knees are tender to palpation medial joint line. Trace effusion bilaterally. Otherwise, the knees are nontender lateral joint line, patella, patellar tendon, quad tendon, fibular head, peroneal nerve, popliteal space. No calf pain or sign of DVT. No pain or laxity with varus or valgus stress. He does have an obvious varus deformity on the left knee. Range of motion on the left is about -5 to 90 degrees, on the right 0 to 120 degrees.  Imaging Prior x-rays of the knees reviewed today. Left knee bone on bone, medial joint space, end stage with varus deformity. Right knee, moderate medial joint space narrowing. On the left, he also has severe patellofemoral spurring, which is likely what is restricting his range of motion at this point.  Assessment & Plan Primary osteoarthritis of left knee (M17.12)  Pt with end-stage L knee DJD, bone-on-bone, refractory to conservative tx, scheduled for L total knee replacement by Dr. Shelle [name] 02/12/17. We again discussed the procedure itself as well as risks, complications and alternatives, including but not limited to DVT, PE, infx, bleeding, failure of procedure, need for secondary procedure including manipulation, nerve injury, ongoing pain/symptoms, anesthesia risk, even stroke or death. Also discussed typical post-op protocols, activity restrictions, need for PT, flexion/extension exercises, time out of work. Discussed need for DVT ppx post-op per protocol. Discussed dental ppx and infx prevention. Also discussed limitations post-operatively such as kneeling and squatting. All questions were answered. Patient desires to proceed with surgery as scheduled. Will hold supplements, ASA and NSAIDs  accordingly. Will remain NPO after MN night before surgery. [facility]. Anticipate hospital stay to include at least 2 midnights given medical history and to ensure proper pain control. He tells me today he may have had a remote MRSA exposure so I have sent in bactroban and CHG decolonization scripts for him. We will swab for MRSA and can add clindamycin or switch to vanco if needed pending those results. Plan ASA 325mg  BID for DVT ppx post-op x 2 weeks then once daily at 325mg . Plan Percocet extra strength, Robaxin, Colace, Miralax. Plan home with HHPT post-op with family members at home for assistance before transitioning to outpt PT. Will follow up 10-14 days post-op for staple removal and xrays.  Plan left total knee replacement  Signed electronically by Dorothy SparkJaclyn M Bissell, PA-C for Dr. Shelle [name]

## 2017-02-04 NOTE — H&P (View-Only) (Signed)
Marcello FennelKendall M Weldin DOB: 02-04-1966 Married / Language: [place] / Race: White Male  H&P Date: 02/04/17  Chief Complaint: Left knee pain  History of Present Illness The patient is a 51 year old male who comes in today for a preoperative History and Physical. The patient is scheduled for a left total knee arthroplasty to be performed by Dr. Javier DockerJeffrey C. Beane, [facility] on 02/12/2017. [name] reports chronic progressively worsening L knee pain and instability interfering with ADLs and quality of life. It is refractory to steroid injections, viscosupplementation, bracing, home exercise program, quad strengthening, pain medications, activity modifications and relative rest. He desires to proceed with surgery at this point.  Dr. Shelle IronBeane and the patient mutually agreed to proceed with a total knee replacement. Risks and benefits of the procedure were discussed including stiffness, suboptimal range of motion, persistent pain, infection requiring removal of prosthesis and reinsertion, need for prophylactic antibiotics in the future, for example, dental procedures, possible need for manipulation, revision in the future and also anesthetic complications including DVT, PE, etc. We discussed the perioperative course, time in the hospital, postoperative recovery and the need for elevation to control swelling. We also discussed the predicted range of motion and the probability that squatting and kneeling would be unobtainable in the future. In addition, postoperative anticoagulation was discussed. We have obtained preoperative medical clearance as necessary. Provided illustrated handout and discussed it in detail. They will enroll in the total joint replacement educational forum at the hospital.  Problem List/Past Medical Hx Thumb pain, right (M79.644)  Chronic pain of left knee (M25.562)  Primary osteoarthritis of both knees (M17.0)  Lumbar DDD High blood pressure  Myocardial infarction    Allergies  No Known Drug Allergies [01/09/2016]:  Family History Diabetes Mellitus  father First Degree Relatives  reported Heart Disease  Heart disease in male family member before age 53  Hypertension   Social History  Tobacco use  01/09/2016 Alcohol use  former drinker Children  2 Current work status  working full time Drug/Alcohol Rehab (Currently)  no Drug/Alcohol Rehab (Previously)  no Exercise  Exercises weekly; does running / walking and gym / weights Illicit drug use  no Living situation  live with spouse Marital status  married Never consumed alcohol  01/09/2016: Never consumed alcohol No history of drug/alcohol rehab  Not under pain contract  Number of flights of stairs before winded  4-5 Pain Contract  no Tobacco / smoke exposure  01/09/2016: no  Medications Lipitor (Oral) Specific strength unknown - Active. Aspirin (81MG  Tablet, Oral) Active. blood pressure med Active. Medications Reconciled  Past Surgical History Heart Stents  Spinal Surgery - lumbar fusion  Physical Exam General Mental Status -Alert, cooperative and good historian. General Appearance-pleasant, Not in acute distress. Orientation-Oriented X3. Build & Nutrition-Well nourished and Well developed.  Head and Neck Head-normocephalic, atraumatic . Neck Global Assessment - supple, no bruit auscultated on the right, no bruit auscultated on the left.  Eye Pupil - Bilateral-Regular and Round. Motion - Bilateral-EOMI.  Chest and Lung Exam Auscultation Breath sounds - clear at anterior chest wall and clear at posterior chest wall. Adventitious sounds - No Adventitious sounds.  Cardiovascular Auscultation Rhythm - Regular rate and rhythm. Heart Sounds - S1 WNL and S2 WNL. Murmurs & Other Heart Sounds - Auscultation of the heart reveals - No Murmurs.  Abdomen Palpation/Percussion Tenderness - Abdomen is non-tender to palpation. Rigidity  (guarding) - Abdomen is soft. Auscultation Auscultation of the abdomen reveals - Bowel sounds  normal.  Male Genitourinary Not done, not pertinent to present illness  Musculoskeletal He is well nourished, well developed, awake, alert, oriented x3, has an antalgic gait. Bilateral knees are tender to palpation medial joint line. Trace effusion bilaterally. Otherwise, the knees are nontender lateral joint line, patella, patellar tendon, quad tendon, fibular head, peroneal nerve, popliteal space. No calf pain or sign of DVT. No pain or laxity with varus or valgus stress. He does have an obvious varus deformity on the left knee. Range of motion on the left is about -5 to 90 degrees, on the right 0 to 120 degrees.  Imaging Prior x-rays of the knees reviewed today. Left knee bone on bone, medial joint space, end stage with varus deformity. Right knee, moderate medial joint space narrowing. On the left, he also has severe patellofemoral spurring, which is likely what is restricting his range of motion at this point.  Assessment & Plan Primary osteoarthritis of left knee (M17.12)  Pt with end-stage L knee DJD, bone-on-bone, refractory to conservative tx, scheduled for L total knee replacement by Dr. Beane 02/12/17. We again discussed the procedure itself as well as risks, complications and alternatives, including but not limited to DVT, PE, infx, bleeding, failure of procedure, need for secondary procedure including manipulation, nerve injury, ongoing pain/symptoms, anesthesia risk, even stroke or death. Also discussed typical post-op protocols, activity restrictions, need for PT, flexion/extension exercises, time out of work. Discussed need for DVT ppx post-op per protocol. Discussed dental ppx and infx prevention. Also discussed limitations post-operatively such as kneeling and squatting. All questions were answered. Patient desires to proceed with surgery as scheduled. Will hold supplements, ASA and NSAIDs  accordingly. Will remain NPO after MN night before surgery. Will present to [facility] for pre-op testing. Anticipate hospital stay to include at least 2 midnights given medical history and to ensure proper pain control. He tells me today he may have had a remote MRSA exposure so I have sent in bactroban and CHG decolonization scripts for him. We will swab for MRSA and can add clindamycin or switch to vanco if needed pending those results. Plan ASA 325mg BID for DVT ppx post-op x 2 weeks then once daily at 325mg. Plan Percocet extra strength, Robaxin, Colace, Miralax. Plan home with HHPT post-op with family members at home for assistance before transitioning to outpt PT. Will follow up 10-14 days post-op for staple removal and xrays.  Plan left total knee replacement  Signed electronically by  M , PA-C for Dr. Beane  

## 2017-02-09 NOTE — Progress Notes (Signed)
Clearance Dr. Duke Salviaandolph epic 10-06-16  ekg 04-04-16 epic   Stress 05-09-16 epic

## 2017-02-09 NOTE — Patient Instructions (Signed)
Marcello FennelKendall M Owczarzak  02/09/2017   Your procedure is scheduled on: 02-12-17  [facility]  Entrance [place]  elevators to 3rd floor to  Short Stay Center at     0730 AM.    Call this number if you have problems the morning of surgery [phone]    Remember: ONLY 1 PERSON MAY GO WITH YOU TO SHORT STAY TO GET  READY MORNING OF YOUR SURGERY.  Do not eat food or drink liquids :After Midnight.     Take these medicines the morning of surgery with A SIP OF WATER: metoprolol , percocet if needed                                You may not have any metal on your body including hair pins and              piercings  Do not wear jewelry, , lotions, powders or perfumes, deodorant           .              Men may shave face and neck.   Do not bring valuables to the hospital.  IS NOT             RESPONSIBLE   FOR VALUABLES.  Contacts, dentures or bridgework may not be worn into surgery.  Leave suitcase in the car. After surgery it may be brought to your room.                Please read over the following fact sheets you were given: _____________________________________________________________________           [facility] - Preparing for [facility] Before surgery, you can play an important role.  Because skin is not sterile, your skin needs to be as free of germs as possible.  You can reduce the number of germs on your skin by washing with CHG (chlorahexidine gluconate) soap before surgery.  CHG is an antiseptic cleaner which kills germs and bonds with the skin to continue killing germs even after washing. Please DO NOT use if you have an allergy to CHG or antibacterial soaps.  If your skin becomes reddened/irritated stop using the CHG and inform your nurse when you arrive at Short Stay. Do not shave (including legs and underarms) for at least 48 hours prior to the first CHG shower.  You may shave your face/neck. Please follow these instructions  carefully:  1.  Shower with CHG Soap the night before surgery and the  morning of Surgery.  2.  If you choose to wash your hair, wash your hair first as usual with your  normal  shampoo.  3.  After you shampoo, rinse your hair and body thoroughly to remove the  shampoo.                           4.  Use CHG as you would any other liquid soap.  You can apply chg directly  to the skin and wash                       Gently with a scrungie or clean washcloth.  5.  Apply the CHG Soap to your body ONLY FROM THE NECK DOWN.  Do not use on face/ open                           Wound or open sores. Avoid contact with eyes, ears mouth and genitals (private parts).                       Wash face,  Genitals (private parts) with your normal soap.             6.  Wash thoroughly, paying special attention to the area where your surgery  will be performed.  7.  Thoroughly rinse your body with warm water from the neck down.  8.  DO NOT shower/wash with your normal soap after using and rinsing off  the CHG Soap.                9.  Pat yourself dry with a clean towel.            10.  Wear clean pajamas.            11.  Place clean sheets on your bed the night of your first shower and do not  sleep with pets. Day of Surgery : Do not apply any lotions/deodorants the morning of surgery.  Please wear clean clothes to the hospital/surgery center.  FAILURE TO FOLLOW THESE INSTRUCTIONS MAY RESULT IN THE CANCELLATION OF YOUR SURGERY PATIENT SIGNATURE_________________________________  NURSE SIGNATURE__________________________________  ________________________________________________________________________   Adam Phenix  An incentive spirometer is a tool that can help keep your lungs clear and active. This tool measures how well you are filling your lungs with each breath. Taking long deep breaths may help reverse or decrease the chance of developing breathing (pulmonary) problems (especially infection)  following:  A long period of time when you are unable to move or be active. BEFORE THE PROCEDURE   If the spirometer includes an indicator to show your best effort, your nurse or respiratory therapist will set it to a desired goal.  If possible, sit up straight or lean slightly forward. Try not to slouch.  Hold the incentive spirometer in an upright position. INSTRUCTIONS FOR USE  1. Sit on the edge of your bed if possible, or sit up as far as you can in bed or on a chair. 2. Hold the incentive spirometer in an upright position. 3. Breathe out normally. 4. Place the mouthpiece in your mouth and seal your lips tightly around it. 5. Breathe in slowly and as deeply as possible, raising the piston or the ball toward the top of the column. 6. Hold your breath for 3-5 seconds or for as long as possible. Allow the piston or ball to fall to the bottom of the column. 7. Remove the mouthpiece from your mouth and breathe out normally. 8. Rest for a few seconds and repeat Steps 1 through 7 at least 10 times every 1-2 hours when you are awake. Take your time and take a few normal breaths between deep breaths. 9. The spirometer may include an indicator to show your best effort. Use the indicator as a goal to work toward during each repetition. 10. After each set of 10 deep breaths, practice coughing to be sure your lungs are clear. If you have an incision (the cut made at the time of surgery), support your incision when coughing by placing a pillow or rolled up towels firmly against it. Once you are able to get out of  bed, walk around indoors and cough well. You may stop using the incentive spirometer when instructed by your caregiver.  RISKS AND COMPLICATIONS  Take your time so you do not get dizzy or light-headed.  If you are in pain, you may need to take or ask for pain medication before doing incentive spirometry. It is harder to take a deep breath if you are having pain. AFTER USE  Rest and  breathe slowly and easily.  It can be helpful to keep track of a log of your progress. Your caregiver can provide you with a simple table to help with this. If you are using the spirometer at home, follow these instructions: [place] IF:   You are having difficultly using the spirometer.  You have trouble using the spirometer as often as instructed.  Your pain medication is not giving enough relief while using the spirometer.  You develop fever of 100.5 F (38.1 C) or higher. SEEK IMMEDIATE MEDICAL CARE IF:   You cough up bloody sputum that had not been present before.  You develop fever of 102 F (38.9 C) or greater.  You develop worsening pain at or near the incision site. MAKE SURE YOU:   Understand these instructions.  Will watch your condition.  Will get help right away if you are not doing well or get worse. Document Released: 07/07/2006 Document Revised: 05/19/2011 Document Reviewed: 09/07/2006 [facility] Patient Information 2014 [place], [place].   ________________________________________________________________________

## 2017-02-10 ENCOUNTER — Ambulatory Visit (HOSPITAL_COMMUNITY)
Admission: RE | Admit: 2017-02-10 | Discharge: 2017-02-10 | Disposition: A | Discharge status: Home / Self Care | Payer: BLUE CROSS/BLUE SHIELD | Source: Ambulatory Visit | Attending: Orthopedic Surgery | Admitting: Orthopedic Surgery

## 2017-02-10 ENCOUNTER — Encounter (HOSPITAL_COMMUNITY): Payer: Self-pay

## 2017-02-10 ENCOUNTER — Other Ambulatory Visit: Payer: Self-pay

## 2017-02-10 ENCOUNTER — Encounter (HOSPITAL_COMMUNITY)
Admission: RE | Admit: 2017-02-10 | Discharge: 2017-02-10 | Disposition: A | Discharge status: Home / Self Care | Payer: BLUE CROSS/BLUE SHIELD | Source: Ambulatory Visit | Attending: Specialist | Admitting: Specialist

## 2017-02-10 DIAGNOSIS — M899 Disorder of bone, unspecified: Secondary | ICD-10-CM | POA: Diagnosis not present

## 2017-02-10 DIAGNOSIS — M1712 Unilateral primary osteoarthritis, left knee: Secondary | ICD-10-CM | POA: Insufficient documentation

## 2017-02-10 DIAGNOSIS — Z01818 Encounter for other preprocedural examination: Secondary | ICD-10-CM | POA: Insufficient documentation

## 2017-02-10 HISTORY — DX: Nausea with vomiting, unspecified: Z98.890

## 2017-02-10 HISTORY — DX: Acute myocardial infarction, unspecified: I21.9

## 2017-02-10 HISTORY — DX: Pneumonia, unspecified organism: J18.9

## 2017-02-10 HISTORY — DX: Nausea with vomiting, unspecified: R11.2

## 2017-02-10 HISTORY — DX: Unspecified osteoarthritis, unspecified site: M19.90

## 2017-02-10 LAB — BASIC METABOLIC PANEL
Anion gap: 6 (ref 5–15)
BUN: 13 mg/dL (ref 6–20)
CHLORIDE: 107 mmol/L (ref 101–111)
CO2: 29 mmol/L (ref 22–32)
CREATININE: 0.9 mg/dL (ref 0.61–1.24)
Calcium: 9.6 mg/dL (ref 8.9–10.3)
GFR calc Af Amer: >60 mL/min (ref >60)
GFR calc non Af Amer: >60 mL/min (ref >60)
GLUCOSE: 88 mg/dL (ref 65–99)
POTASSIUM: 4.2 mmol/L (ref 3.5–5.1)
Sodium: 142 mmol/L (ref 135–145)

## 2017-02-10 LAB — URINALYSIS, ROUTINE W REFLEX MICROSCOPIC
Bilirubin Urine: NEGATIVE
Glucose, UA: NEGATIVE mg/dL
HGB URINE DIPSTICK: NEGATIVE
Ketones, ur: 5 mg/dL — AB
Leukocytes, UA: NEGATIVE
Nitrite: NEGATIVE
PH: 5 (ref 5.0–8.0)
Protein, ur: NEGATIVE mg/dL
SPECIFIC GRAVITY, URINE: 1.027 (ref 1.005–1.030)

## 2017-02-10 LAB — CBC
HEMATOCRIT: 44.5 % (ref 39.0–52.0)
Hemoglobin: 15.4 g/dL (ref 13.0–17.0)
MCH: 30.4 pg (ref 26.0–34.0)
MCHC: 34.6 g/dL (ref 30.0–36.0)
MCV: 87.9 fL (ref 78.0–100.0)
Platelets: 277 10*3/uL (ref 150–400)
RBC: 5.06 MIL/uL (ref 4.22–5.81)
RDW: 13.1 % (ref 11.5–15.5)
WBC: 6.4 10*3/uL (ref 4.0–10.5)

## 2017-02-10 LAB — APTT: aPTT: 32 seconds (ref 24–36)

## 2017-02-10 LAB — SURGICAL PCR SCREEN
MRSA, PCR: NEGATIVE
Staphylococcus aureus: NEGATIVE

## 2017-02-10 LAB — PROTIME-INR
INR: 1.01
Prothrombin Time: 13.2 seconds (ref 11.4–15.2)

## 2017-02-11 ENCOUNTER — Encounter (HOSPITAL_COMMUNITY): Payer: BLUE CROSS/BLUE SHIELD

## 2017-02-12 ENCOUNTER — Inpatient Hospital Stay (HOSPITAL_COMMUNITY): Payer: BLUE CROSS/BLUE SHIELD | Admitting: Certified Registered Nurse Anesthetist

## 2017-02-12 ENCOUNTER — Inpatient Hospital Stay (HOSPITAL_COMMUNITY)
Admission: RE | Admit: 2017-02-12 | Discharge: 2017-02-14 | DRG: 470 | Disposition: A | Discharge status: Home Health | Payer: BLUE CROSS/BLUE SHIELD | Source: Ambulatory Visit | Attending: Specialist | Admitting: Specialist

## 2017-02-12 ENCOUNTER — Other Ambulatory Visit: Payer: Self-pay

## 2017-02-12 ENCOUNTER — Encounter (HOSPITAL_COMMUNITY)
Admission: RE | Disposition: A | Discharge status: Home Health | Payer: Self-pay | Source: Ambulatory Visit | Attending: Specialist

## 2017-02-12 ENCOUNTER — Encounter (HOSPITAL_COMMUNITY): Payer: Self-pay | Admitting: *Deleted

## 2017-02-12 ENCOUNTER — Inpatient Hospital Stay (HOSPITAL_COMMUNITY): Payer: BLUE CROSS/BLUE SHIELD

## 2017-02-12 DIAGNOSIS — M79644 Pain in right finger(s): Secondary | ICD-10-CM | POA: Diagnosis present

## 2017-02-12 DIAGNOSIS — M25762 Osteophyte, left knee: Secondary | ICD-10-CM | POA: Diagnosis present

## 2017-02-12 DIAGNOSIS — I2510 Atherosclerotic heart disease of native coronary artery without angina pectoris: Secondary | ICD-10-CM | POA: Diagnosis present

## 2017-02-12 DIAGNOSIS — I252 Old myocardial infarction: Secondary | ICD-10-CM

## 2017-02-12 DIAGNOSIS — M170 Bilateral primary osteoarthritis of knee: Principal | ICD-10-CM | POA: Diagnosis present

## 2017-02-12 DIAGNOSIS — Z833 Family history of diabetes mellitus: Secondary | ICD-10-CM | POA: Diagnosis not present

## 2017-02-12 DIAGNOSIS — M1712 Unilateral primary osteoarthritis, left knee: Secondary | ICD-10-CM

## 2017-02-12 DIAGNOSIS — G8929 Other chronic pain: Secondary | ICD-10-CM | POA: Diagnosis present

## 2017-02-12 DIAGNOSIS — Z96659 Presence of unspecified artificial knee joint: Secondary | ICD-10-CM

## 2017-02-12 DIAGNOSIS — I119 Hypertensive heart disease without heart failure: Secondary | ICD-10-CM | POA: Diagnosis present

## 2017-02-12 DIAGNOSIS — K219 Gastro-esophageal reflux disease without esophagitis: Secondary | ICD-10-CM | POA: Diagnosis present

## 2017-02-12 DIAGNOSIS — Z8249 Family history of ischemic heart disease and other diseases of the circulatory system: Secondary | ICD-10-CM

## 2017-02-12 DIAGNOSIS — E785 Hyperlipidemia, unspecified: Secondary | ICD-10-CM | POA: Diagnosis present

## 2017-02-12 DIAGNOSIS — Z981 Arthrodesis status: Secondary | ICD-10-CM | POA: Diagnosis not present

## 2017-02-12 DIAGNOSIS — Z955 Presence of coronary angioplasty implant and graft: Secondary | ICD-10-CM

## 2017-02-12 HISTORY — PX: TOTAL KNEE ARTHROPLASTY: SHX125

## 2017-02-12 SURGERY — ARTHROPLASTY, KNEE, TOTAL
Anesthesia: Monitor Anesthesia Care | Site: Knee | Laterality: Left

## 2017-02-12 MED ORDER — ALBUMIN HUMAN 5 % IV SOLN
INTRAVENOUS | Status: DC | PRN
  Administered 2017-02-12: 11:00:00 via INTRAVENOUS

## 2017-02-12 MED ORDER — DEXAMETHASONE SODIUM PHOSPHATE 10 MG/ML IJ SOLN
INTRAMUSCULAR | Status: DC | PRN
  Administered 2017-02-12: 10 mg via INTRAVENOUS

## 2017-02-12 MED ORDER — PROPOFOL 10 MG/ML IV BOLUS
INTRAVENOUS | Status: AC
  Filled 2017-02-12: qty 20

## 2017-02-12 MED ORDER — ACETAMINOPHEN 10 MG/ML IV SOLN
1000.0000 mg | INTRAVENOUS | Status: AC
  Administered 2017-02-12: 1000 mg via INTRAVENOUS
  Filled 2017-02-12: qty 100

## 2017-02-12 MED ORDER — PROPOFOL 500 MG/50ML IV EMUL
INTRAVENOUS | Status: DC | PRN
  Administered 2017-02-12: 125 ug/kg/min via INTRAVENOUS

## 2017-02-12 MED ORDER — ONDANSETRON HCL 4 MG/2ML IJ SOLN
INTRAMUSCULAR | Status: DC | PRN
  Administered 2017-02-12: 4 mg via INTRAVENOUS

## 2017-02-12 MED ORDER — ONDANSETRON HCL 4 MG PO TABS: 4.0000 mg | ORAL_TABLET | Freq: Four times a day (QID) | ORAL | Status: DC | PRN

## 2017-02-12 MED ORDER — HYDROMORPHONE HCL 1 MG/ML IJ SOLN
1.0000 mg | INTRAMUSCULAR | Status: DC | PRN
  Administered 2017-02-12 – 2017-02-13 (×2): 1 mg via INTRAVENOUS
  Filled 2017-02-12 (×2): qty 1

## 2017-02-12 MED ORDER — OXYCODONE HCL 5 MG PO TABS
5.0000 mg | ORAL_TABLET | ORAL | Status: DC | PRN
  Administered 2017-02-12 (×2): 5 mg via ORAL
  Filled 2017-02-12 (×2): qty 1

## 2017-02-12 MED ORDER — MIDAZOLAM HCL 2 MG/2ML IJ SOLN
2.0000 mg | Freq: Once | INTRAMUSCULAR | Status: AC
  Administered 2017-02-12: 2 mg via INTRAVENOUS

## 2017-02-12 MED ORDER — CEFAZOLIN SODIUM-DEXTROSE 2-4 GM/100ML-% IV SOLN
2.0000 g | Freq: Four times a day (QID) | INTRAVENOUS | Status: AC
  Administered 2017-02-12 (×2): 2 g via INTRAVENOUS
  Filled 2017-02-12 (×2): qty 100

## 2017-02-12 MED ORDER — ACETAMINOPHEN 325 MG PO TABS: 650.0000 mg | ORAL_TABLET | ORAL | Status: DC | PRN

## 2017-02-12 MED ORDER — PHENYLEPHRINE HCL 10 MG/ML IJ SOLN
INTRAMUSCULAR | Status: DC | PRN
  Administered 2017-02-12: 50 ug/min via INTRAVENOUS

## 2017-02-12 MED ORDER — BUPIVACAINE-EPINEPHRINE 0.25% -1:200000 IJ SOLN
INTRAMUSCULAR | Status: AC
  Filled 2017-02-12: qty 1

## 2017-02-12 MED ORDER — PROPOFOL 10 MG/ML IV BOLUS
INTRAVENOUS | Status: AC
  Filled 2017-02-12: qty 40

## 2017-02-12 MED ORDER — METOCLOPRAMIDE HCL 5 MG PO TABS: 5.0000 mg | ORAL_TABLET | Freq: Three times a day (TID) | ORAL | Status: DC | PRN

## 2017-02-12 MED ORDER — SODIUM CHLORIDE 0.9 % IR SOLN
Status: DC | PRN
Start: 2017-02-12 — End: 2017-02-12
  Administered 2017-02-12: 1000 mL

## 2017-02-12 MED ORDER — CEFAZOLIN SODIUM-DEXTROSE 2-4 GM/100ML-% IV SOLN
2.0000 g | INTRAVENOUS | Status: AC
  Administered 2017-02-12: 2 g via INTRAVENOUS
  Filled 2017-02-12: qty 100

## 2017-02-12 MED ORDER — LACTATED RINGERS IV SOLN
INTRAVENOUS | Status: DC
  Administered 2017-02-12 (×2): via INTRAVENOUS

## 2017-02-12 MED ORDER — HYDROMORPHONE HCL 1 MG/ML IJ SOLN: 0.2500 mg | INTRAMUSCULAR | Status: DC | PRN

## 2017-02-12 MED ORDER — ONDANSETRON HCL 4 MG/2ML IJ SOLN: 4.0000 mg | Freq: Four times a day (QID) | INTRAMUSCULAR | Status: DC | PRN

## 2017-02-12 MED ORDER — METHOCARBAMOL 500 MG PO TABS
500.0000 mg | ORAL_TABLET | Freq: Four times a day (QID) | ORAL | Status: DC | PRN
  Administered 2017-02-13 (×2): 500 mg via ORAL
  Filled 2017-02-12 (×2): qty 1

## 2017-02-12 MED ORDER — ONDANSETRON HCL 4 MG/2ML IJ SOLN
INTRAMUSCULAR | Status: AC
  Filled 2017-02-12: qty 2

## 2017-02-12 MED ORDER — MIDAZOLAM HCL 2 MG/2ML IJ SOLN
INTRAMUSCULAR | Status: AC
  Filled 2017-02-12: qty 2

## 2017-02-12 MED ORDER — TRANEXAMIC ACID 1000 MG/10ML IV SOLN
1000.0000 mg | INTRAVENOUS | Status: AC
  Administered 2017-02-12: 1000 mg via INTRAVENOUS
  Filled 2017-02-12: qty 1100

## 2017-02-12 MED ORDER — FENTANYL CITRATE (PF) 100 MCG/2ML IJ SOLN
100.0000 ug | Freq: Once | INTRAMUSCULAR | Status: AC
  Administered 2017-02-12: 100 ug via INTRAVENOUS

## 2017-02-12 MED ORDER — MAGNESIUM CITRATE PO SOLN: 1.0000 | Freq: Once | ORAL | Status: DC | PRN

## 2017-02-12 MED ORDER — SODIUM CHLORIDE 0.9 % IV SOLN
INTRAVENOUS | Status: AC
  Filled 2017-02-12: qty 500000

## 2017-02-12 MED ORDER — ALUM & MAG HYDROXIDE-SIMETH 200-200-20 MG/5ML PO SUSP: 30.0000 mL | ORAL | Status: DC | PRN

## 2017-02-12 MED ORDER — RISAQUAD PO CAPS
1.0000 | ORAL_CAPSULE | Freq: Every day | ORAL | Status: DC
  Administered 2017-02-12 – 2017-02-14 (×3): 1 via ORAL
  Filled 2017-02-12 (×3): qty 1

## 2017-02-12 MED ORDER — OXYCODONE HCL 5 MG PO TABS
10.0000 mg | ORAL_TABLET | ORAL | Status: DC | PRN
  Administered 2017-02-12 – 2017-02-14 (×6): 10 mg via ORAL
  Filled 2017-02-12 (×7): qty 2

## 2017-02-12 MED ORDER — BISACODYL 5 MG PO TBEC: 5.0000 mg | DELAYED_RELEASE_TABLET | Freq: Every day | ORAL | Status: DC | PRN

## 2017-02-12 MED ORDER — METHOCARBAMOL 1000 MG/10ML IJ SOLN
500.0000 mg | Freq: Four times a day (QID) | INTRAVENOUS | Status: DC | PRN
  Administered 2017-02-12: 500 mg via INTRAVENOUS
  Filled 2017-02-12: qty 550

## 2017-02-12 MED ORDER — PHENYLEPHRINE HCL 10 MG/ML IJ SOLN
INTRAMUSCULAR | Status: DC | PRN
  Administered 2017-02-12 (×3): 80 ug via INTRAVENOUS
  Administered 2017-02-12: 120 ug via INTRAVENOUS

## 2017-02-12 MED ORDER — PROMETHAZINE HCL 25 MG/ML IJ SOLN: 6.2500 mg | INTRAMUSCULAR | Status: DC | PRN

## 2017-02-12 MED ORDER — ACETAMINOPHEN 650 MG RE SUPP
650.0000 mg | RECTAL | Status: DC | PRN
Start: 2017-02-12 — End: 2017-02-14

## 2017-02-12 MED ORDER — PHENOL 1.4 % MT LIQD
1.0000 | OROMUCOSAL | Status: DC | PRN
  Filled 2017-02-12: qty 177

## 2017-02-12 MED ORDER — SODIUM CHLORIDE 0.9 % IV SOLN
INTRAVENOUS | Status: DC | PRN
  Administered 2017-02-12: 500 mL

## 2017-02-12 MED ORDER — DIPHENHYDRAMINE HCL 12.5 MG/5ML PO ELIX: 12.5000 mg | ORAL_SOLUTION | ORAL | Status: DC | PRN

## 2017-02-12 MED ORDER — PROPOFOL 10 MG/ML IV BOLUS
INTRAVENOUS | Status: DC | PRN
  Administered 2017-02-12 (×4): 20 mg via INTRAVENOUS

## 2017-02-12 MED ORDER — FENTANYL CITRATE (PF) 100 MCG/2ML IJ SOLN
INTRAMUSCULAR | Status: AC
  Filled 2017-02-12: qty 2

## 2017-02-12 MED ORDER — BUPIVACAINE-EPINEPHRINE 0.25% -1:200000 IJ SOLN
INTRAMUSCULAR | Status: DC | PRN
  Administered 2017-02-12: 30 mL

## 2017-02-12 MED ORDER — DOCUSATE SODIUM 100 MG PO CAPS
100.0000 mg | ORAL_CAPSULE | Freq: Two times a day (BID) | ORAL | Status: DC
  Administered 2017-02-13 – 2017-02-14 (×3): 100 mg via ORAL
  Filled 2017-02-12 (×3): qty 1

## 2017-02-12 MED ORDER — MENTHOL 3 MG MT LOZG
1.0000 | LOZENGE | OROMUCOSAL | Status: DC | PRN
  Administered 2017-02-12: 3 mg via ORAL
  Filled 2017-02-12: qty 9

## 2017-02-12 MED ORDER — ROPIVACAINE HCL 5 MG/ML IJ SOLN
INTRAMUSCULAR | Status: DC | PRN
  Administered 2017-02-12: 30 mL via PERINEURAL

## 2017-02-12 MED ORDER — METOPROLOL SUCCINATE ER 25 MG PO TB24
25.0000 mg | ORAL_TABLET | Freq: Every day | ORAL | Status: DC
  Administered 2017-02-13 – 2017-02-14 (×2): 25 mg via ORAL
  Filled 2017-02-12 (×2): qty 1

## 2017-02-12 MED ORDER — STERILE WATER FOR IRRIGATION IR SOLN
Status: DC | PRN
  Administered 2017-02-12: 2000 mL

## 2017-02-12 MED ORDER — FENTANYL CITRATE (PF) 100 MCG/2ML IJ SOLN
INTRAMUSCULAR | Status: AC
Start: 2017-02-12 — End: ?
  Filled 2017-02-12: qty 2

## 2017-02-12 MED ORDER — DEXAMETHASONE SODIUM PHOSPHATE 10 MG/ML IJ SOLN
INTRAMUSCULAR | Status: AC
  Filled 2017-02-12: qty 1

## 2017-02-12 MED ORDER — POLYETHYLENE GLYCOL 3350 17 G PO PACK: 17.0000 g | PACK | Freq: Every day | ORAL | Status: DC | PRN

## 2017-02-12 MED ORDER — EPHEDRINE SULFATE 50 MG/ML IJ SOLN
INTRAMUSCULAR | Status: DC | PRN
  Administered 2017-02-12: 5 mg via INTRAVENOUS

## 2017-02-12 MED ORDER — KCL IN DEXTROSE-NACL 20-5-0.45 MEQ/L-%-% IV SOLN
INTRAVENOUS | Status: AC
  Administered 2017-02-12: 16:00:00 via INTRAVENOUS
  Filled 2017-02-12 (×2): qty 1000

## 2017-02-12 MED ORDER — BUPIVACAINE IN DEXTROSE 0.75-8.25 % IT SOLN
INTRATHECAL | Status: DC | PRN
  Administered 2017-02-12: 15 mL via INTRATHECAL

## 2017-02-12 MED ORDER — ASPIRIN EC 325 MG PO TBEC
325.0000 mg | DELAYED_RELEASE_TABLET | Freq: Two times a day (BID) | ORAL | Status: DC
  Administered 2017-02-13 – 2017-02-14 (×3): 325 mg via ORAL
  Filled 2017-02-12 (×3): qty 1

## 2017-02-12 MED ORDER — METOCLOPRAMIDE HCL 5 MG/ML IJ SOLN: 5.0000 mg | Freq: Three times a day (TID) | INTRAMUSCULAR | Status: DC | PRN

## 2017-02-12 SURGICAL SUPPLY — 65 items
BAG ZIPLOCK 12X15 (MISCELLANEOUS) IMPLANT
BANDAGE ACE 4X5 VEL STRL LF (GAUZE/BANDAGES/DRESSINGS) ×3 IMPLANT
BANDAGE ACE 6X5 VEL STRL LF (GAUZE/BANDAGES/DRESSINGS) ×3 IMPLANT
BLADE SAG 18X100X1.27 (BLADE) ×3 IMPLANT
BLADE SAW SGTL 11.0X1.19X90.0M (BLADE) ×3 IMPLANT
BLADE SAW SGTL 13.0X1.19X90.0M (BLADE) ×3 IMPLANT
BOWL SMART MIX CTS (DISPOSABLE) ×6 IMPLANT
CAPT KNEE TOTAL 3 ATTUNE ×3 IMPLANT
CEMENT HV SMART SET (Cement) ×12 IMPLANT
CLOSURE WOUND 1/2 X4 (GAUZE/BANDAGES/DRESSINGS)
CLOTH 2% CHLOROHEXIDINE 3PK (PERSONAL CARE ITEMS) ×3 IMPLANT
COVER SURGICAL LIGHT HANDLE (MISCELLANEOUS) ×3 IMPLANT
CUFF TOURN SGL QUICK 34 (TOURNIQUET CUFF) ×2
CUFF TRNQT CYL 34X4X40X1 (TOURNIQUET CUFF) ×1 IMPLANT
DECANTER SPIKE VIAL GLASS SM (MISCELLANEOUS) ×3 IMPLANT
DRAPE INCISE IOBAN 66X45 STRL (DRAPES) IMPLANT
DRAPE ORTHO SPLIT 77X108 STRL (DRAPES) ×4
DRAPE SHEET LG 3/4 BI-LAMINATE (DRAPES) ×3 IMPLANT
DRAPE SURG ORHT 6 SPLT 77X108 (DRAPES) ×2 IMPLANT
DRAPE U-SHAPE 47X51 STRL (DRAPES) ×3 IMPLANT
DRSG AQUACEL AG ADV 3.5X10 (GAUZE/BANDAGES/DRESSINGS) IMPLANT
DRSG TEGADERM 4X4.75 (GAUZE/BANDAGES/DRESSINGS) IMPLANT
DURAPREP 26ML APPLICATOR (WOUND CARE) ×3 IMPLANT
ELECT REM PT RETURN 15FT ADLT (MISCELLANEOUS) ×3 IMPLANT
EVACUATOR 1/8 PVC DRAIN (DRAIN) IMPLANT
GAUZE SPONGE 2X2 8PLY STRL LF (GAUZE/BANDAGES/DRESSINGS) IMPLANT
GLOVE BIOGEL PI IND STRL 7.0 (GLOVE) ×1 IMPLANT
GLOVE BIOGEL PI IND STRL 7.5 (GLOVE) ×6 IMPLANT
GLOVE BIOGEL PI IND STRL 8 (GLOVE) ×1 IMPLANT
GLOVE BIOGEL PI INDICATOR 7.0 (GLOVE) ×2
GLOVE BIOGEL PI INDICATOR 7.5 (GLOVE) ×12
GLOVE BIOGEL PI INDICATOR 8 (GLOVE) ×2
GLOVE SURG SS PI 7.0 STRL IVOR (GLOVE) ×3 IMPLANT
GLOVE SURG SS PI 7.5 STRL IVOR (GLOVE) ×3 IMPLANT
GLOVE SURG SS PI 8.0 STRL IVOR (GLOVE) ×6 IMPLANT
GOWN STRL REUS W/ TWL LRG LVL3 (GOWN DISPOSABLE) ×2 IMPLANT
GOWN STRL REUS W/TWL LRG LVL3 (GOWN DISPOSABLE) ×4
GOWN STRL REUS W/TWL XL LVL3 (GOWN DISPOSABLE) ×9 IMPLANT
HANDPIECE INTERPULSE COAX TIP (DISPOSABLE) ×2
HEMOSTAT SPONGE AVITENE ULTRA (HEMOSTASIS) ×3 IMPLANT
IMMOBILIZER KNEE 20 (SOFTGOODS) ×3
IMMOBILIZER KNEE 20 THIGH 36 (SOFTGOODS) ×1 IMPLANT
MANIFOLD NEPTUNE II (INSTRUMENTS) ×3 IMPLANT
NS IRRIG 1000ML POUR BTL (IV SOLUTION) IMPLANT
PACK TOTAL KNEE CUSTOM (KITS) ×3 IMPLANT
POSITIONER SURGICAL ARM (MISCELLANEOUS) ×3 IMPLANT
SET HNDPC FAN SPRY TIP SCT (DISPOSABLE) ×1 IMPLANT
SPONGE GAUZE 2X2 STER 10/PKG (GAUZE/BANDAGES/DRESSINGS)
SPONGE SURGIFOAM ABS GEL 100 (HEMOSTASIS) ×3 IMPLANT
STAPLER VISISTAT (STAPLE) IMPLANT
STRIP CLOSURE SKIN 1/2X4 (GAUZE/BANDAGES/DRESSINGS) IMPLANT
SUT BONE WAX W31G (SUTURE) IMPLANT
SUT MNCRL AB 4-0 PS2 18 (SUTURE) IMPLANT
SUT STRATAFIX 0 PDS 27 VIOLET (SUTURE) ×3
SUT VIC AB 1 CT1 27 (SUTURE) ×4
SUT VIC AB 1 CT1 27XBRD ANTBC (SUTURE) ×2 IMPLANT
SUT VIC AB 2-0 CT1 27 (SUTURE) ×6
SUT VIC AB 2-0 CT1 TAPERPNT 27 (SUTURE) ×3 IMPLANT
SUTURE STRATFX 0 PDS 27 VIOLET (SUTURE) ×1 IMPLANT
SYR 50ML LL SCALE MARK (SYRINGE) IMPLANT
TOWER CARTRIDGE SMART MIX (DISPOSABLE) ×3 IMPLANT
TRAY FOLEY W/METER SILVER 16FR (SET/KITS/TRAYS/PACK) ×3 IMPLANT
WATER STERILE IRR 1000ML POUR (IV SOLUTION) ×3 IMPLANT
WRAP KNEE MAXI GEL POST OP (GAUZE/BANDAGES/DRESSINGS) ×3 IMPLANT
YANKAUER SUCT BULB TIP 10FT TU (MISCELLANEOUS) ×3 IMPLANT

## 2017-02-12 NOTE — Anesthesia Procedure Notes (Signed)
Spinal  Patient location during procedure: OR Start time: 02/12/2017 10:24 AM End time: 02/12/2017 10:33 AM Staffing Anesthesiologist: Heather RobertsSinger, Ryley Bachtel, MD Performed: anesthesiologist  Preanesthetic Checklist Completed: patient identified, surgical consent, pre-op evaluation, timeout performed, IV checked, risks and benefits discussed and monitors and equipment checked Spinal Block Patient position: sitting Prep: DuraPrep Patient monitoring: cardiac monitor, continuous pulse ox and blood pressure Approach: midline Location: L2-3 Injection technique: single-shot Needle Needle type: Pencan  Needle gauge: 24 G Needle length: 9 cm Additional Notes Functioning IV was confirmed and monitors were applied. Sterile prep and drape, including hand hygiene and sterile gloves were used. The patient was positioned and the spine was prepped. The skin was anesthetized with lidocaine.  Free flow of clear CSF was obtained prior to injecting local anesthetic into the CSF.  The spinal needle aspirated freely following injection.  The needle was carefully withdrawn.  The patient tolerated the procedure well.

## 2017-02-12 NOTE — Anesthesia Postprocedure Evaluation (Signed)
Anesthesia Post Note  Patient: BRENNDEN MASTEN  Procedure(s) Performed: LEFT TOTAL KNEE ARTHROPLASTY (Left Knee)     Patient location during evaluation: PACU Anesthesia Type: MAC and Spinal Level of consciousness: awake and alert Pain management: pain level controlled Vital Signs Assessment: post-procedure vital signs reviewed and stable Respiratory status: spontaneous breathing and respiratory function stable Cardiovascular status: blood pressure returned to baseline and stable Postop Assessment: spinal receding Anesthetic complications: no    Last Vitals:  Vitals:   02/12/17 1348 02/12/17 1400  BP: (!) 99/48 (!) 101/49  Pulse:  72  Resp:  13  Temp:  36.5 C  SpO2:  100%    Last Pain:  Vitals:   02/12/17 1400  TempSrc:   PainSc: 0-No pain    LLE Motor Response: Purposeful movement (02/12/17 1400) LLE Sensation: Decreased (02/12/17 1400) RLE Motor Response: Purposeful movement(bends knee off bed) (02/12/17 1400) RLE Sensation: Decreased (02/12/17 1400) L Sensory Level: S1-Sole of foot, small toes (02/12/17 1400) R Sensory Level: S1-Sole of foot, small toes (02/12/17 1400)  Haifa Hatton DANIEL

## 2017-02-12 NOTE — Transfer of Care (Signed)
Immediate Anesthesia Transfer of Care Note  Patient: [name]  Procedure(s) Performed: LEFT TOTAL KNEE ARTHROPLASTY (Left Knee)  Patient Location: PACU  Anesthesia Type:Regional and Spinal  Level of Consciousness: awake, alert  and oriented  Airway & Oxygen Therapy: Patient Spontanous Breathing and Patient connected to face mask oxygen  Post-op Assessment: Report given to RN and Post -op Vital signs reviewed and stable  Post vital signs: Reviewed and stable  Last Vitals:  Vitals:   02/12/17 0930 02/12/17 0935  BP: (!) 109/58 118/67  Pulse: 69 70  Resp: 15 16  Temp:    SpO2: 94% 99%    Last Pain:  Vitals:   02/12/17 0935  TempSrc:   PainSc: 0-No pain      Patients Stated Pain Goal: 4 (02/12/17 0751)  Complications: No apparent anesthesia complications

## 2017-02-12 NOTE — Anesthesia Procedure Notes (Signed)
Anesthesia Regional Block: Adductor canal block   Pre-Anesthetic Checklist: ,, timeout performed, Correct Patient, Correct Site, Correct Laterality, Correct Procedure, Correct Position, site marked, Risks and benefits discussed,  Surgical consent,  Pre-op evaluation,  At surgeon's request and post-op pain management  Laterality: Left and Lower  Prep: Maximum Sterile Barrier Precautions used, chloraprep       Needles:  Injection technique: Single-shot  Needle Type: Echogenic Stimulator Needle     Needle Length: 10cm      Additional Needles:   Procedures:,,,, ultrasound used (permanent image in chart),,,,  Narrative:  Start time: 02/12/2017 9:21 AM End time: 02/12/2017 9:26 AM Injection made incrementally with aspirations every 5 mL.  Performed by: Personally  Anesthesiologist: Phillips Groutarignan, Elba Schaber, MD  Additional Notes: Risks, benefits and alternative to block explained extensively.  Patient tolerated procedure well, without complications.

## 2017-02-12 NOTE — Anesthesia Preprocedure Evaluation (Addendum)
Anesthesia Evaluation  Patient identified by MRN, date of birth, ID band Patient awake    Reviewed: Allergy & Precautions, NPO status , Patient's Chart, lab work & pertinent test results, reviewed documented beta blocker date and time   History of Anesthesia Complications (+) PONV and history of anesthetic complications  Airway Mallampati: II  TM Distance: >3 FB Neck ROM: Full    Dental no notable dental hx. (+) Dental Advisory Given   Pulmonary neg pulmonary ROS,    Pulmonary exam normal        Cardiovascular hypertension, Pt. on medications and Pt. on home beta blockers + CAD and + Past MI  Normal cardiovascular exam  The left ventricular ejection fraction is normal (55-65%).  Nuclear stress EF: 55%. Show more   There was no ST segment deviation noted during stress.  The study is normal.  This is a low risk study.  Normal stress nuclear study with no ischemia or infarction; EF 55 with  normal wall motion.      Neuro/Psych negative neurological ROS  negative psych ROS   GI/Hepatic Neg liver ROS, GERD  ,  Endo/Other  negative endocrine ROS  Renal/GU negative Renal ROS  negative genitourinary   Musculoskeletal negative musculoskeletal ROS (+)   Abdominal   Peds negative pediatric ROS (+)  Hematology negative hematology ROS (+)   Anesthesia Other Findings   Reproductive/Obstetrics negative OB ROS                            Anesthesia Physical Anesthesia Plan  ASA: III  Anesthesia Plan: MAC and Spinal   Post-op Pain Management:  Regional for Post-op pain   Induction:   PONV Risk Score and Plan: 2 and Ondansetron and Propofol infusion  Airway Management Planned: Natural Airway and Simple Face Mask  Additional Equipment:   Intra-op Plan:   Post-operative Plan:   Informed Consent: I have reviewed the patients History and Physical, chart, labs and discussed the  procedure including the risks, benefits and alternatives for the proposed anesthesia with the patient or authorized representative who has indicated his/her understanding and acceptance.   Dental advisory given  Plan Discussed with: Anesthesiologist and CRNA  Anesthesia Plan Comments:       Anesthesia Quick Evaluation

## 2017-02-12 NOTE — Interval H&P Note (Signed)
History and Physical Interval Note:  02/12/2017 9:50 AM  Victor FennelKendall M Roets  has presented today for surgery, with the diagnosis of Left knee degenerative joint disease  The various methods of treatment have been discussed with the patient and family. After consideration of risks, benefits and other options for treatment, the patient has consented to  Procedure(s) with comments: LEFT TOTAL KNEE ARTHROPLASTY (Left) - 120 mins as a surgical intervention .  The patient's history has been reviewed, patient examined, no change in status, stable for surgery.  I have reviewed the patient's chart and labs.  Questions were answered to the patient's satisfaction.     Taysha Majewski C

## 2017-02-12 NOTE — Brief Op Note (Signed)
02/12/2017  1:02 PM  PATIENT:  [name]  51 y.o. male  PRE-OPERATIVE DIAGNOSIS:  Left knee degenerative joint disease  POST-OPERATIVE DIAGNOSIS:  Left knee degenerative joint disease  PROCEDURE:  Procedure(s) with comments: LEFT TOTAL KNEE ARTHROPLASTY (Left) - 120 mins  SURGEON:  Surgeon(s) and Role:    [name]* Aaliyana Fredericks, MD - Primary  PHYSICIAN ASSISTANT:   ASSISTANTS: Bissell   ANESTHESIA:   spinal  EBL:  50 mL   BLOOD ADMINISTERED:none  DRAINS: none   LOCAL MEDICATIONS USED:  MARCAINE     SPECIMEN:  No Specimen  DISPOSITION OF SPECIMEN:  N/A  COUNTS:  YES  TOURNIQUET:   Total Tourniquet Time Documented: Thigh (Left) - 88 minutes Total: Thigh (Left) - 88 minutes   DICTATION: .Other Dictation: Dictation Number [id number]  PLAN OF CARE: Admit to inpatient   PATIENT DISPOSITION:  PACU - hemodynamically stable.   Delay start of Pharmacological VTE agent (>24hrs) due to surgical blood loss or risk of bleeding: no

## 2017-02-12 NOTE — Progress Notes (Addendum)
Assisted Dr. Acey Lavarignan with block. Side rails up, monitors on throughout procedure. See vital signs in flow sheet. Tolerated Procedure well. Pt is resting well and easily aroused with no s/s of distress. VS and orders assessed and will continue to monitor and tx pt according to MD orders.

## 2017-02-12 NOTE — Anesthesia Procedure Notes (Signed)
Procedure Name: MAC Date/Time: 02/12/2017 10:26 AM Performed by: West Pugh, CRNA Pre-anesthesia Checklist: Patient identified, Emergency Drugs available, Suction available, Patient being monitored and Timeout performed Patient Re-evaluated:Patient Re-evaluated prior to induction Oxygen Delivery Method: Non-rebreather mask Placement Confirmation: CO2 detector Dental Injury: Teeth and Oropharynx as per pre-operative assessment

## 2017-02-12 NOTE — Interval H&P Note (Signed)
History and Physical Interval Note:  02/12/2017 9:50 AM  [name]  has presented today for surgery, with the diagnosis of Left knee degenerative joint disease  The various methods of treatment have been discussed with the patient and family. After consideration of risks, benefits and other options for treatment, the patient has consented to  Procedure(s) with comments: LEFT TOTAL KNEE ARTHROPLASTY (Left) - 120 mins as a surgical intervention .  The patient's history has been reviewed, patient examined, no change in status, stable for surgery.  I have reviewed the patient's chart and labs.  Questions were answered to the patient's satisfaction.     [name]   

## 2017-02-12 NOTE — Evaluation (Signed)
Physical Therapy Evaluation Patient Details Name: Marcello FennelKendall M Harshberger MRN: [id number] DOB: 1965/12/26 Today's Date: 02/12/2017   History of Present Illness  L TKA  Clinical Impression  The patient  Ambulated x 60'. Plans home with HHPT. Complains of catheter discomfort.    Follow Up Recommendations Home health PT    Equipment Recommendations  Rolling walker with 5" wheels    Recommendations for Other Services       Precautions / Restrictions Precautions Precautions: Knee Required Braces or Orthoses: Knee Immobilizer - Left Restrictions Weight Bearing Restrictions: No      Mobility  Bed Mobility Overal bed mobility: Needs Assistance Bed Mobility: Supine to Sit     Supine to sit: Min assist     General bed mobility comments: support left leg, cues for technique  Transfers Overall transfer level: Needs assistance Equipment used: Rolling walker (2 wheeled) Transfers: Sit to/from Stand Sit to Stand: Min assist         General transfer comment: cues for hand and left leg position  Ambulation/Gait Ambulation/Gait assistance: Min assist Ambulation Distance (Feet): 60 Feet Assistive device: Rolling walker (2 wheeled) Gait Pattern/deviations: Step-to pattern;Step-through pattern;Antalgic     General Gait Details: cues for sequence and psoture  Stairs            Wheelchair Mobility    Modified Rankin (Stroke Patients Only)       Balance                                             Pertinent Vitals/Pain Pain Assessment: 0-10 Pain Score: 5  Pain Location: left knee Pain Descriptors / Indicators: Aching Pain Intervention(s): Monitored during session;Premedicated before session;Ice applied;Repositioned    Home Living Family/patient expects to be discharged to:: Private residence Living Arrangements: Spouse/significant other Available Help at Discharge: Family Type of Home: House Home Access: Level entry     Home Layout: One  level Home Equipment: Gilmer Morane - single point      Prior Function Level of Independence: Independent               Hand Dominance        Extremity/Trunk Assessment   Upper Extremity Assessment Upper Extremity Assessment: Overall WFL for tasks assessed    Lower Extremity Assessment Lower Extremity Assessment: RLE deficits/detail RLE Deficits / Details: knee flxion 10-40    Cervical / Trunk Assessment Cervical / Trunk Assessment: Normal  Communication   Communication: No difficulties  Cognition Arousal/Alertness: Awake/alert Behavior During Therapy: WFL for tasks assessed/performed Overall Cognitive Status: Within Functional Limits for tasks assessed                                        General Comments      Exercises Total Joint Exercises Quad Sets: AROM;5 reps;Left Heel Slides: AAROM;Left;5 reps Straight Leg Raises: AAROM;Left;5 reps   Assessment/Plan    PT Assessment Patient needs continued PT services  PT Problem List Decreased strength;Decreased range of motion;Decreased knowledge of use of DME;Decreased activity tolerance;Decreased safety awareness;Decreased knowledge of precautions;Pain;Decreased mobility       PT Treatment Interventions DME instruction;Therapeutic exercise;Gait training;Stair training;Functional mobility training;Therapeutic activities;Patient/family education    PT Goals (Current goals can be found in the Care Plan section)  Acute Rehab PT Goals Patient Stated  Goal: to walk without pain PT Goal Formulation: With patient/family Time For Goal Achievement: 02/17/17 Potential to Achieve Goals: Good    Frequency 7X/week   Barriers to discharge        Co-evaluation               AM-PAC PT "6 Clicks" Daily Activity  Outcome Measure Difficulty turning over in bed (including adjusting bedclothes, sheets and blankets)?: A Little Difficulty moving from lying on back to sitting on the side of the bed? : A  Little Difficulty sitting down on and standing up from a chair with arms (e.g., wheelchair, bedside commode, etc,.)?: A Little Help needed moving to and from a bed to chair (including a wheelchair)?: A Little Help needed walking in hospital room?: A Little Help needed climbing 3-5 steps with a railing? : A Lot 6 Click Score: 17    End of Session Equipment Utilized During Treatment: Left knee immobilizer Activity Tolerance: Patient tolerated treatment well Patient left: in chair;with call bell/phone within reach;with chair alarm set Nurse Communication: Mobility status PT Visit Diagnosis: Difficulty in walking, not elsewhere classified (R26.2);Pain Pain - Right/Left: Left Pain - part of body: Knee    Time: 1710-1739 PT Time Calculation (min) (ACUTE ONLY): 29 min   Charges:   PT Evaluation $PT Eval Low Complexity: 1 Low PT Treatments $Gait Training: 8-22 mins   PT G CodesBlanchard Kelch:        Mahayla Haddaway PT [phone]   Rada HayHill, Kiala Faraj Elizabeth 02/12/2017, 5:50 PM

## 2017-02-13 LAB — CBC
HEMATOCRIT: 37.6 % — AB (ref 39.0–52.0)
Hemoglobin: 12.6 g/dL — ABNORMAL LOW (ref 13.0–17.0)
MCH: 29.2 pg (ref 26.0–34.0)
MCHC: 33.5 g/dL (ref 30.0–36.0)
MCV: 87.2 fL (ref 78.0–100.0)
Platelets: 261 10*3/uL (ref 150–400)
RBC: 4.31 MIL/uL (ref 4.22–5.81)
RDW: 12.8 % (ref 11.5–15.5)
WBC: 16.9 10*3/uL — AB (ref 4.0–10.5)

## 2017-02-13 LAB — BASIC METABOLIC PANEL
ANION GAP: 8 (ref 5–15)
BUN: 15 mg/dL (ref 6–20)
CHLORIDE: 105 mmol/L (ref 101–111)
CO2: 25 mmol/L (ref 22–32)
Calcium: 8.8 mg/dL — ABNORMAL LOW (ref 8.9–10.3)
Creatinine, Ser: 1.02 mg/dL (ref 0.61–1.24)
GFR calc Af Amer: >60 mL/min (ref >60)
GFR calc non Af Amer: >60 mL/min (ref >60)
GLUCOSE: 155 mg/dL — AB (ref 65–99)
POTASSIUM: 4.1 mmol/L (ref 3.5–5.1)
Sodium: 138 mmol/L (ref 135–145)

## 2017-02-13 MED ORDER — ASPIRIN 325 MG PO TBEC: 325.0000 mg | DELAYED_RELEASE_TABLET | Freq: Two times a day (BID) | ORAL | 0 refills | Status: DC

## 2017-02-13 MED ORDER — OXYCODONE HCL 5 MG PO TABS: 5.0000 mg | ORAL_TABLET | ORAL | 0 refills | Status: DC | PRN

## 2017-02-13 MED ORDER — METHOCARBAMOL 500 MG PO TABS: 500.0000 mg | ORAL_TABLET | Freq: Four times a day (QID) | ORAL | 1 refills | Status: DC | PRN

## 2017-02-13 MED ORDER — DOCUSATE SODIUM 100 MG PO CAPS: 100.0000 mg | ORAL_CAPSULE | Freq: Two times a day (BID) | ORAL | 0 refills | Status: DC

## 2017-02-13 MED ORDER — POLYETHYLENE GLYCOL 3350 17 G PO PACK
17.0000 g | PACK | Freq: Every day | ORAL | 0 refills | Status: DC | PRN
Start: 2017-02-13 — End: 2017-05-22

## 2017-02-13 NOTE — Progress Notes (Signed)
Occupational Therapy Evaluation Patient Details Name: [name] MRN: [id number] DOB: 11-Apr-1965 Today's Date: 02/13/2017    History of Present Illness L TKA   Clinical Impression   All OT education completed and pt questions answered. No further OT needs at this time. Will sign off.    Follow Up Recommendations  DC plan and follow up therapy as arranged by surgeon;No OT follow up;Supervision - Intermittent    Equipment Recommendations  3 in 1 bedside commode    Recommendations for Other Services       Precautions / Restrictions Precautions Precautions: Knee Precaution Comments: IND SLRs today, KI not used  Required Braces or Orthoses: Knee Immobilizer - Left Knee Immobilizer - Left: Discontinue once straight leg raise with < 10 degree lag Restrictions Weight Bearing Restrictions: No Other Position/Activity Restrictions: WBAT      Mobility Bed Mobility             General bed mobility comments: NT -- OOB in recliner upon arrival  Transfers Overall transfer level: Needs assistance Equipment used: Rolling walker (2 wheeled) Transfers: Sit to/from Stand Sit to Stand: Supervision;Modified independent (Device/Increase time)             Balance                                           ADL either performed or assessed with clinical judgement   ADL Overall ADL's : Needs assistance/impaired Eating/Feeding: Independent   Grooming: Supervision/safety;Standing           Upper Body Dressing : Set up;Sitting   Lower Body Dressing: Minimal assistance;Sit to/from stand   Toilet Transfer: Supervision/safety;Ambulation;BSC;RW   Toileting- Clothing Manipulation and Hygiene: Supervision/safety;Sit to/from stand   Tub/ Shower Transfer: Walk-in shower;Min guard;Ambulation;Rolling walker   Functional mobility during ADLs: Supervision/safety;Rolling walker General ADL Comments: Educated patient on LB self-care techniques.     Vision          Perception     Praxis      Pertinent Vitals/Pain Pain Assessment: 0-10 Pain Score: 1  Pain Location: left knee Pain Descriptors / Indicators: Aching;Sore Pain Intervention(s): Limited activity within patient's tolerance;Monitored during session;Ice applied     Hand Dominance     Extremity/Trunk Assessment Upper Extremity Assessment Upper Extremity Assessment: Overall WFL for tasks assessed   Lower Extremity Assessment Lower Extremity Assessment: Defer to PT evaluation   Cervical / Trunk Assessment Cervical / Trunk Assessment: Normal   Communication Communication Communication: No difficulties   Cognition Arousal/Alertness: Awake/alert Behavior During Therapy: WFL for tasks assessed/performed Overall Cognitive Status: Within Functional Limits for tasks assessed                                     General Comments       Exercises    Shoulder Instructions      Home Living Family/patient expects to be discharged to:: Private residence Living Arrangements: Spouse/significant other Available Help at Discharge: Family Type of Home: House Home Access: Level entry     Home Layout: One level     Bathroom Shower/Tub: Producer, television/film/videoWalk-in shower   Bathroom Toilet: Standard Bathroom Accessibility: Yes How Accessible: Accessible via walker Home Equipment: Cane - single point;Shower seat - built in          Prior Functioning/Environment Level of  Independence: Independent                 OT Problem List: Decreased strength;Decreased range of motion;Decreased knowledge of use of DME or AE;Pain      OT Treatment/Interventions:      OT Goals(Current goals can be found in the care plan section) Acute Rehab OT Goals Patient Stated Goal: to walk without pain OT Goal Formulation: All assessment and education complete, DC therapy  OT Frequency:     Barriers to D/C:            Co-evaluation              AM-PAC PT "6 Clicks" Daily  Activity     Outcome Measure Help from another person eating meals?: None Help from another person taking care of personal grooming?: None Help from another person toileting, which includes using toliet, bedpan, or urinal?: A Little Help from another person bathing (including washing, rinsing, drying)?: A Little Help from another person to put on and taking off regular upper body clothing?: None Help from another person to put on and taking off regular lower body clothing?: A Little 6 Click Score: 21   End of Session Equipment Utilized During Treatment: Rolling walker Nurse Communication: Mobility status  Activity Tolerance: Patient tolerated treatment well Patient left: in chair;with call bell/phone within reach;with family/visitor present  OT Visit Diagnosis: Unsteadiness on feet (R26.81);Muscle weakness (generalized) (M62.81)                Time: 6295-28411058-1110 OT Time Calculation (min): 12 min Charges:  OT General Charges $OT Visit: 1 Visit OT Evaluation $OT Eval Low Complexity: 1 Low G-Codes:      Sheryl Saintil A Marlaina Coburn 02/13/2017, 11:48 AM

## 2017-02-13 NOTE — Progress Notes (Signed)
Case Management following for Home Health needs.  Vivi BarrackNicole Esabella Stockinger, Theresia MajorsLCSWA, MSW Clinical Social Worker  [phone] 02/13/2017  8:45 AM

## 2017-02-13 NOTE — Progress Notes (Addendum)
Subjective: 1 Day Post-Op Procedure(s) (LRB): LEFT TOTAL KNEE ARTHROPLASTY (Left) Patient reports pain as 3 on 0-10 scale.    Objective: Vital signs in last 24 hours: Temp:  [97.7 F (36.5 C)-98.3 F (36.8 C)] 98.3 F (36.8 C) (12/07 1056) Pulse Rate:  [68-81] 80 (12/07 1056) Resp:  [11-18] 14 (12/07 1056) BP: (88-143)/(38-74) 143/74 (12/07 1056) SpO2:  [97 %-100 %] 98 % (12/07 1056)  Intake/Output from previous day: 12/06 0701 - 12/07 0700 In: 4143.8 [P.O.:600; I.V.:3143.8; IV Piggyback:400] Out: 1650 [Urine:1600; Blood:50] Intake/Output this shift: Total I/O In: 300 [P.O.:300] Out: -   Recent Labs    02/13/17 0512  HGB 12.6*   Recent Labs    02/13/17 0512  WBC 16.9*  RBC 4.31  HCT 37.6*  PLT 261   Recent Labs    02/13/17 0512  NA 138  K 4.1  CL 105  CO2 25  BUN 15  CREATININE 1.02  GLUCOSE 155*  CALCIUM 8.8*   No results for input(s): LABPT, INR in the last 72 hours.  Minimal swelling 2+ pulses. No DVT  Decreased sensation medial knee. Motor intact  Assessment/Plan: 1 Day Post-Op Procedure(s) (LRB): LEFT TOTAL KNEE ARTHROPLASTY (Left) Advance diet Up with therapy Discharge home with home health tomorrow Residual block medial. D/C instructions given F/U 2 weeks  Tiphanie Vo C 02/13/2017, 12:58 PM

## 2017-02-13 NOTE — Progress Notes (Signed)
Physical Therapy Treatment Patient Details Name: Victor FennelKendall M Jasso MRN: [id number] DOB: November 28, 1965 Today's Date: 02/13/2017    History of Present Illness L TKA    PT Comments    Pt doing well; all questions and concerns addressed; feels ready for D/C today   Follow Up Recommendations  Home health PT     Equipment Recommendations  Rolling walker with 5" wheels    Recommendations for Other Services       Precautions / Restrictions Precautions Precautions: Knee Precaution Comments: IND SLRs today, KI not used  Required Braces or Orthoses: Knee Immobilizer - Left Knee Immobilizer - Left: Discontinue once straight leg raise with < 10 degree lag Restrictions Weight Bearing Restrictions: No    Mobility  Bed Mobility   Bed Mobility: Supine to Sit     Supine to sit: Min guard     General bed mobility comments: support/guide left leg, cues for technique  Transfers Overall transfer level: Needs assistance Equipment used: Rolling walker (2 wheeled) Transfers: Sit to/from Stand Sit to Stand: Supervision;Modified independent (Device/Increase time)         General transfer comment: good carryover form previous session for hand placement and LLE position  Ambulation/Gait Ambulation/Gait assistance: Supervision Ambulation Distance (Feet): 120 Feet Assistive device: Rolling walker (2 wheeled) Gait Pattern/deviations: Step-to pattern;Step-through pattern     General Gait Details: cues for sequence and RW position   Stairs            Wheelchair Mobility    Modified Rankin (Stroke Patients Only)       Balance                                            Cognition Arousal/Alertness: Awake/alert Behavior During Therapy: WFL for tasks assessed/performed Overall Cognitive Status: Within Functional Limits for tasks assessed                                        Exercises Total Joint Exercises Quad Sets: AROM;Both;10  reps Short Arc QuadBarbaraann Boys: AAROM;Left;10 reps Heel Slides: AAROM;Left;10 reps Hip ABduction/ADduction: AROM;Strengthening;Left;10 reps Straight Leg Raises: AROM;Left;10 reps Long Arc Quad: AROM;Left;5 reps Knee Flexion: AROM;Left;5 reps;AAROM;Seated Goniometric ROM: grossly 5* to 65* in supine, ~75* flexion in sitting    General Comments        Pertinent Vitals/Pain Pain Assessment: 0-10 Pain Score: 3  Pain Location: left knee Pain Descriptors / Indicators: Aching;Burning;Grimacing Pain Intervention(s): Limited activity within patient's tolerance;Monitored during session;Premedicated before session;Repositioned;Ice applied    Home Living                      Prior Function            PT Goals (current goals can now be found in the care plan section) Acute Rehab PT Goals Patient Stated Goal: to walk without pain PT Goal Formulation: With patient/family Time For Goal Achievement: 02/17/17 Potential to Achieve Goals: Good Progress towards PT goals: Progressing toward goals    Frequency    7X/week      PT Plan Current plan remains appropriate    Co-evaluation              AM-PAC PT "6 Clicks" Daily Activity  Outcome Measure  Difficulty turning over in bed (including adjusting bedclothes,  sheets and blankets)?: A Little Difficulty moving from lying on back to sitting on the side of the bed? : A Little Difficulty sitting down on and standing up from a chair with arms (e.g., wheelchair, bedside commode, etc,.)?: A Little Help needed moving to and from a bed to chair (including a wheelchair)?: A Little Help needed walking in hospital room?: A Little Help needed climbing 3-5 steps with a railing? : A Little 6 Click Score: 18    End of Session   Activity Tolerance: Patient tolerated treatment well Patient left: in chair;with call bell/phone within reach;with family/visitor present Nurse Communication: Mobility status PT Visit Diagnosis: Difficulty in  walking, not elsewhere classified (R26.2);Pain Pain - Right/Left: Left Pain - part of body: Knee     Time: [id number] PT Time Calculation (min) (ACUTE ONLY): 34 min  Charges:  $Gait Training: 8-22 mins $Therapeutic Exercise: 8-22 mins                    G Codes:          Rishard Delange 02/13/2017, 11:05 AM

## 2017-02-13 NOTE — Progress Notes (Signed)
Discharge planning, spoke with patient and spouse at bedside. Have chosen [facility] for HH PT. Contacted [facility] for referral. Needs RW, contacted [facility] to deliver to room. [phone] 

## 2017-02-13 NOTE — Op Note (Signed)
NAME:  [name], [name]                   ACCOUNT NO.:  MEDICAL RECORD NO.:  [id number]  LOCATION:                                 FACILITY:  PHYSICIAN:  Jene EveryJeffrey Falisa Lamora, M.D.         DATE OF BIRTH:  DATE OF PROCEDURE:  02/12/2017 DATE OF DISCHARGE:                              OPERATIVE REPORT   PREOPERATIVE DIAGNOSIS:  End-stage osteoarthritis, left knee.  POSTOPERATIVE DIAGNOSIS:  End-stage osteoarthritis, left knee.  PROCEDURE PERFORMED:  Left total knee arthroplasty utilizing DePuy rotating platform, 7 femur, 6 tibia, 6 insert, 41 patella.  ANESTHESIA:  Spinal.  ASSISTANT:  Andrez GrimeJaclyn Bissell, PA.  Technical difficulty increased due to the patient's flexion contracture and varus deformity.  HISTORY:  A 51 year old with end-stage osteoarthritis of the knee.  He had a varus deformity, bone-on-bone arthrosis, large osteophytes of the patella, and posteriorly, his range of motion was -20 to 90.  Indicated for replacement of the degenerated joint.  Risks and benefits were discussed including bleeding, infection, damage to the neurovascular structures, suboptimal range of motion, DVT, PE, anesthetic complications, etc.  TECHNIQUE:  With the patient in supine position after induction of adequate general anesthesia, 2 g of Kefzol, left lower extremity was prepped and draped in usual sterile fashion, exsanguinated and thigh tourniquet inflated to 250 mmHg.  Midline incision was then made over the skin, subcutaneous tissue was dissected and electrocautery was utilized to achieve hemostasis.  Full-thickness flaps developed.  Median parapatellar arthrotomy performed.  Patella everted and knee flexed, and the femoral condyles were noted to be very posterior.  Meticulously removed all osteophytes with a rongeur, a fairly large osteophyte of the patella.  Elevated the soft tissues medially preserving the MCL, is fairly tight medially.  A step drill was utilized to enter the femoral canal,  irrigated, a 5-degree left was used with 9 off the distal femur. Distal femoral cut was performed.  We then attempted to size off the anterior cortex.  The patient's femoral condyles and osteophyte fairly large medially.  The trial guide was then placed off the anterior cortex, 3 degrees of external rotation, sized to a 7.  We pinned it and then performed our anterior, posterior and chamfer cuts.  Still, the patient had large osteophyte posteriorly.  It was not accessible.  We felt, it then gone toward the tibia to prepare the tibia, first to gain access posteriorly.  Used external alignment guide, bisecting tibiotalar joint, 2 off the defect, which was medial; 1 off the defect, which was medial and actually measured 10 off the high side, which was lateral. He had a significant varus deformity of the tibia as well.  After multiple configurations, we then pinned the jig and we performed our proximal tibia cut, we were able to gain just beneath the cortical surface medially.  The extension gap was then trialed and it was fairly tight medially.  We then flexed the knee back up, used our curved Crego to meticulously develop a plane between the osteophyte and the capsular structures posteriorly.  This was a large curved Crego, and then we used an osteotome, meticulously removing the osteophyte, preserving the attachment  of the MCL.  We did this medially and laterally, there was a defect in the condyle as well as large cystic structure, which it was on the proximal tibia as well.  We turned our attention back towards the tibia, trialed it to a 6, seemed to fit maximally the 7 overhang medially.  Just the medial aspect of tibial tubercle, it was pinned, drilled centrally, used our punch guide.  I then placed our femoral trial on and drilled our lug holes.  Placed a 6 insert and reduced, and actually had the full extension.  Fairly some tightness still medially. Again, we flexed it medially and  there were some residual osteophytes, again removed with soft tissues protected at all times.  We then measured the patella, there was large osteophyte that was removed, it was measured to a 25, planed to a 15, utilizing external alignment jig for the patella, cutting the parallel.  This was then measured at a 15. Following the cut, we used a 41 medializing the patella, drilled our peg holes, placed a patella, reduced it and had excellent patellofemoral tracking.  Following this, we then removed all trials, checked posteriorly, cauterized the geniculates.  Additional osteophytes removed, Popliteus intact.  Copiously irrigated with pulsatile lavage. Flexed the knee, dried the surfaces thoroughly.  There was large cystic cavern in the tibia.  We used a Gelfoam as a cement restrictor.  We mixed the cement on back table and cement initially that was mixed and we had an additional cement packet due to the cavity defect, was mixed inconsistently and had to be discarded.  Then obtained additional cement mixed in the appropriate fashion and we injected in the tibia, digitally pressurized and impacted and cemented our tibial tray.  We cemented and impacted the femur as well.  Impacted it with a good fit both on the femur and the tibia, reduced it with a 6-mm insert and held an axial load throughout the curing of the cement.  I then cemented and impacted the patella.  The knee was held in full extension with an axial load applied.  After appropriate curing of the cement, the knee was then flexed up.  Redundant cement removed.  On the lateral side, the femoral component was 2 mm off the distal femoral cut.  I then augmented it back and grafted it with additional cement.  The 6 appeared to be excellent with full extension and full flexion.  Good stability and varus-valgus stressing at 0-30 degrees, negative anterior drawer, excellent patellofemoral tracking.  Next, we injected 0.25% Marcaine  with epinephrine into the periosteum.  The tourniquet had been deflated at the curing of the cement at 88 minutes.  Any bleeders were cauterized. We had selected the 6 after meticulously removing redundant cement.  We irrigated and put a 6 permanent insert, reduced it, and had full flexion, full extension, good stability and varus-valgus stressing at 0- 30 degrees, negative anterior drawer.  I then repaired the patellar arthrotomy with #1 Vicryl interrupted figure-of-eight sutures and then reinforced it with running Stratafix.  Subcu with 2-0 and skin with staples.  Wound was dressed sterilely.  Placed in a knee immobilizer, transported to the recovery room in satisfactory condition.  The patient tolerated the procedure well.  No complications.  Assistant, Andrez GrimeJaclyn Bissell, [place].  Minimal blood loss.     Jene EveryJeffrey Grantham Hippert, M.D.     Cordelia PenJB/MEDQ  D:  02/12/2017  T:  02/12/2017  Job:  [id number]

## 2017-02-13 NOTE — Progress Notes (Signed)
,      Durable Medical Equipment  (From admission, onward)        Start     Ordered   02/13/17 1111  For home use only DME 3 n 1  Once     02/13/17 1111   02/13/17 1059  For home use only DME Walker rolling  Once    Question:  Patient needs a walker to treat with the following condition  Answer:  S/P knee surgery   02/13/17 1059   02/13/17 1002  For home use only DME Walker rolling  Once    Question:  Patient needs a walker to treat with the following condition  Answer:  Knee buckling   02/13/17 1002

## 2017-02-14 LAB — CBC
HEMATOCRIT: 32.6 % — AB (ref 39.0–52.0)
Hemoglobin: 11.3 g/dL — ABNORMAL LOW (ref 13.0–17.0)
MCH: 30.1 pg (ref 26.0–34.0)
MCHC: 34.7 g/dL (ref 30.0–36.0)
MCV: 86.7 fL (ref 78.0–100.0)
PLATELETS: 242 10*3/uL (ref 150–400)
RBC: 3.76 MIL/uL — ABNORMAL LOW (ref 4.22–5.81)
RDW: 13.1 % (ref 11.5–15.5)
WBC: 14 10*3/uL — AB (ref 4.0–10.5)

## 2017-02-14 NOTE — Progress Notes (Signed)
Pt to d/c home with [facility]. AVS reviewed and "My Chart" discussed with pt. Pt capable of verbalizing medications, signs and symptoms of infection, and follow-up appointments. Remains hemodynamically stable. No signs and symptoms of distress. Educated pt to return to ER in the case of SOB, dizziness, or chest pain.  

## 2017-02-14 NOTE — Progress Notes (Signed)
Subjective: 2 Days Post-Op Procedure(s) (LRB): LEFT TOTAL KNEE ARTHROPLASTY (Left)  Patient reports pain as mild to moderate.  Tolerating POs well.  Admits to flatus.  Denies fever, chills, N/V, CP, SOB.  Reports that he has worked well with therapy and is ready to go home.  Objective:   VITALS:  Temp:  [98.2 F (36.8 C)-99 F (37.2 C)] 98.9 F (37.2 C) (12/08 0646) Pulse Rate:  [79-82] 79 (12/08 0646) Resp:  [14-16] 16 (12/08 0646) BP: (130-146)/(74-83) 130/80 (12/08 0646) SpO2:  [98 %-99 %] 99 % (12/08 0646)  General: WDWN patient in NAD. Psych:  Appropriate mood and affect. Neuro:  A&O x 3, Moving all extremities, sensation intact to light touch HEENT:  EOMs intact Chest:  Even non-labored respirations Skin:  Dressing/aquacel C/D/I, no rashes or lesions Extremities: warm/dry, mild edema, no erythmea or echymosis.  No lymphadenopathy. Pulses: Popliteus 2+ MSK:  ROM: lacks 5 degrees of TKE, MMT: patient is able to perform quad set, (-) Homan's    LABS Recent Labs    02/13/17 0512 02/14/17 0527  HGB 12.6* 11.3*  WBC 16.9* 14.0*  PLT 261 242   Recent Labs    02/13/17 0512  NA 138  K 4.1  CL 105  CO2 25  BUN 15  CREATININE 1.02  GLUCOSE 155*   No results for input(s): LABPT, INR in the last 72 hours.   Assessment/Plan: 2 Days Post-Op Procedure(s) (LRB): LEFT TOTAL KNEE ARTHROPLASTY (Left)  Patient seen in rounds for Dr. Rosendo GrosBeane WBAT L LE D/C home with HHPT Scripts on chart Plan for 2 week outpatient post-op visit with Dr. Meda CoffeeBeane  Justin Ollis, PA-C [facility] Office:  [phone]

## 2017-02-14 NOTE — Discharge Instructions (Signed)

## 2017-02-14 NOTE — Progress Notes (Signed)
Physical Therapy Treatment Patient Details Name: [name] MRN: [id number] DOB: August 17, 1965 Today's Date: 02/14/2017    History of Present Illness L TKA    PT Comments    POD # 2  Pt eager to get home to "take a shower".  Pt impulsive and required redirection and instruction.  Pt given handout TKR TE's HEP and instructed on freq, proper tech and use of ICE.  Assisted with amb in hallway with VC's safety with turns and backward gait.  Spouse present during session.    Follow Up Recommendations  Home health PT     Equipment Recommendations  Rolling walker with 5" wheels;3in1 (PT)    Recommendations for Other Services       Precautions / Restrictions Precautions Precautions: Knee Precaution Comments: IND SLRs today, KI not used  Restrictions Weight Bearing Restrictions: No Other Position/Activity Restrictions: WBAT    Mobility  Bed Mobility Overal bed mobility: Needs Assistance Bed Mobility: Supine to Sit     Supine to sit: Min guard     General bed mobility comments: assist L LE only  Transfers Overall transfer level: Needs assistance Equipment used: Rolling walker (2 wheeled) Transfers: Sit to/from Stand Sit to Stand: Modified independent (Device/Increase time)         General transfer comment: VC's for safety/impulsive  Ambulation/Gait Ambulation/Gait assistance: Supervision Ambulation Distance (Feet): 138 Feet Assistive device: Rolling walker (2 wheeled) Gait Pattern/deviations: Step-to pattern;Step-through pattern Gait velocity: VC to decrease gait speed to increase safety   General Gait Details: cues for sequence and RW position   Stairs Stairs: (no stairs)          Wheelchair Mobility    Modified Rankin (Stroke Patients Only)       Balance                                            Cognition Arousal/Alertness: Awake/alert Behavior During Therapy: WFL for tasks assessed/performed Overall Cognitive Status:  Within Functional Limits for tasks assessed                                        Exercises      General Comments        Pertinent Vitals/Pain Pain Assessment: 0-10 Pain Score: 4  Pain Location: left knee Pain Descriptors / Indicators: Aching;Sore;Operative site guarding Pain Intervention(s): Monitored during session;Premedicated before session;Repositioned;Ice applied    Home Living                      Prior Function            PT Goals (current goals can now be found in the care plan section) Progress towards PT goals: Progressing toward goals    Frequency    7X/week      PT Plan Current plan remains appropriate    Co-evaluation              AM-PAC PT "6 Clicks" Daily Activity  Outcome Measure  Difficulty turning over in bed (including adjusting bedclothes, sheets and blankets)?: A Little Difficulty moving from lying on back to sitting on the side of the bed? : A Little Difficulty sitting down on and standing up from a chair with arms (e.g., wheelchair, bedside commode, etc,.)?: A Little Help  needed moving to and from a bed to chair (including a wheelchair)?: A Little Help needed walking in hospital room?: A Little Help needed climbing 3-5 steps with a railing? : A Little 6 Click Score: 18    End of Session Equipment Utilized During Treatment: Left knee immobilizer Activity Tolerance: Patient tolerated treatment well Patient left: in chair;with call bell/phone within reach;with family/visitor present Nurse Communication: Mobility status PT Visit Diagnosis: Difficulty in walking, not elsewhere classified (R26.2);Pain Pain - Right/Left: Left Pain - part of body: Knee     Time: [id number] PT Time Calculation (min) (ACUTE ONLY): 27 min  Charges:  $Gait Training: 8-22 mins $Therapeutic Exercise: 8-22 mins                    G Codes:       Felecia ShellingLori Oneisha Ammons  PTA [facility]  Acute  Rehab Pager      [phone]

## 2017-02-17 NOTE — Discharge Summary (Signed)
Physician Discharge Summary   Patient ID: [name] MRN: [id number] DOB/AGE: 1965/05/09 51 y.o.  Admit date: 02/12/2017 Discharge date: 02/14/2017  Primary Diagnosis: left knee primary osteoarthritis  Admission Diagnoses:  Past Medical History:  Diagnosis Date  . Arthritis   . CAD S/P percutaneous coronary angioplasty    a. s/p NSTEMI 7/17:  EF 55%. pLAD 40%, mLAD 50%, dLCx 20%, OM2 100%,>> PCI: 2.5 x 14 mm Resolute DES to OM2   . GERD (gastroesophageal reflux disease)   . History of non-ST elevation myocardial infarction (NSTEMI)    Noted to have occluded OM 2 treated with DES stent  . Hyperlipidemia 09/17/2015  . Hypertension   . Hypertensive heart disease 09/17/2015  . Myocardial infarction ([place])    11/2015  . Pneumonia    1998  . PONV (postoperative nausea and vomiting)    After Cath   Discharge Diagnoses:   Principal Problem:   Primary osteoarthritis of left knee Active Problems:   Left knee DJD  Estimated body mass index is 27.72 kg/m as calculated from the following:   Height as of this encounter: '5\' 7"'  (1.702 m).   Weight as of this encounter: 80.3 kg (177 lb).  Procedure:  Procedure(s) (LRB): LEFT TOTAL KNEE ARTHROPLASTY (Left)   Consults: None  HPI: see H&P Laboratory Data: Admission on 02/12/2017, Discharged on 02/14/2017  Component Date Value Ref Range Status  . WBC 02/13/2017 16.9* 4.0 - 10.5 K/uL Final  . RBC 02/13/2017 4.31  4.22 - 5.81 MIL/uL Final  . Hemoglobin 02/13/2017 12.6* 13.0 - 17.0 g/dL Final  . HCT 02/13/2017 37.6* 39.0 - 52.0 % Final  . MCV 02/13/2017 87.2  78.0 - 100.0 fL Final  . MCH 02/13/2017 29.2  26.0 - 34.0 pg Final  . MCHC 02/13/2017 33.5  30.0 - 36.0 g/dL Final  . RDW 02/13/2017 12.8  11.5 - 15.5 % Final  . Platelets 02/13/2017 261  150 - 400 K/uL Final  . Sodium 02/13/2017 138  135 - 145 mmol/L Final  . Potassium 02/13/2017 4.1  3.5 - 5.1 mmol/L Final  . Chloride 02/13/2017 105  101 - 111 mmol/L Final  . CO2  02/13/2017 25  22 - 32 mmol/L Final  . Glucose, Bld 02/13/2017 155* 65 - 99 mg/dL Final  . BUN 02/13/2017 15  6 - 20 mg/dL Final  . Creatinine, Ser 02/13/2017 1.02  0.61 - 1.24 mg/dL Final  . Calcium 02/13/2017 8.8* 8.9 - 10.3 mg/dL Final  . GFR calc non Af Amer 02/13/2017 >60  >60 mL/min Final  . GFR calc Af Amer 02/13/2017 >60  >60 mL/min Final   Comment: (NOTE) The eGFR has been calculated using the CKD EPI equation. This calculation has not been validated in all clinical situations. eGFR's persistently <60 mL/min signify possible Chronic Kidney Disease.   . Anion gap 02/13/2017 8  5 - 15 Final  . WBC 02/14/2017 14.0* 4.0 - 10.5 K/uL Final   WHITE COUNT CONFIRMED ON SMEAR  . RBC 02/14/2017 3.76* 4.22 - 5.81 MIL/uL Final  . Hemoglobin 02/14/2017 11.3* 13.0 - 17.0 g/dL Final  . HCT 02/14/2017 32.6* 39.0 - 52.0 % Final  . MCV 02/14/2017 86.7  78.0 - 100.0 fL Final  . MCH 02/14/2017 30.1  26.0 - 34.0 pg Final  . MCHC 02/14/2017 34.7  30.0 - 36.0 g/dL Final  . RDW 02/14/2017 13.1  11.5 - 15.5 % Final  . Platelets 02/14/2017 242  150 - 400 K/uL Final  Hospital Outpatient  Visit on 02/10/2017  Component Date Value Ref Range Status  . aPTT 02/10/2017 32  24 - 36 seconds Final  . Sodium 02/10/2017 142  135 - 145 mmol/L Final  . Potassium 02/10/2017 4.2  3.5 - 5.1 mmol/L Final  . Chloride 02/10/2017 107  101 - 111 mmol/L Final  . CO2 02/10/2017 29  22 - 32 mmol/L Final  . Glucose, Bld 02/10/2017 88  65 - 99 mg/dL Final  . BUN 02/10/2017 13  6 - 20 mg/dL Final  . Creatinine, Ser 02/10/2017 0.90  0.61 - 1.24 mg/dL Final  . Calcium 02/10/2017 9.6  8.9 - 10.3 mg/dL Final  . GFR calc non Af Amer 02/10/2017 >60  >60 mL/min Final  . GFR calc Af Amer 02/10/2017 >60  >60 mL/min Final   Comment: (NOTE) The eGFR has been calculated using the CKD EPI equation. This calculation has not been validated in all clinical situations. eGFR's persistently <60 mL/min signify possible Chronic  Kidney Disease.   . Anion gap 02/10/2017 6  5 - 15 Final  . WBC 02/10/2017 6.4  4.0 - 10.5 K/uL Final  . RBC 02/10/2017 5.06  4.22 - 5.81 MIL/uL Final  . Hemoglobin 02/10/2017 15.4  13.0 - 17.0 g/dL Final  . HCT 02/10/2017 44.5  39.0 - 52.0 % Final  . MCV 02/10/2017 87.9  78.0 - 100.0 fL Final  . MCH 02/10/2017 30.4  26.0 - 34.0 pg Final  . MCHC 02/10/2017 34.6  30.0 - 36.0 g/dL Final  . RDW 02/10/2017 13.1  11.5 - 15.5 % Final  . Platelets 02/10/2017 277  150 - 400 K/uL Final  . Prothrombin Time 02/10/2017 13.2  11.4 - 15.2 seconds Final  . INR 02/10/2017 1.01   Final  . Color, Urine 02/10/2017 YELLOW  YELLOW Final  . APPearance 02/10/2017 CLEAR  CLEAR Final  . Specific Gravity, Urine 02/10/2017 1.027  1.005 - 1.030 Final  . pH 02/10/2017 5.0  5.0 - 8.0 Final  . Glucose, UA 02/10/2017 NEGATIVE  NEGATIVE mg/dL Final  . Hgb urine dipstick 02/10/2017 NEGATIVE  NEGATIVE Final  . Bilirubin Urine 02/10/2017 NEGATIVE  NEGATIVE Final  . Ketones, ur 02/10/2017 5* NEGATIVE mg/dL Final  . Protein, ur 02/10/2017 NEGATIVE  NEGATIVE mg/dL Final  . Nitrite 02/10/2017 NEGATIVE  NEGATIVE Final  . Leukocytes, UA 02/10/2017 NEGATIVE  NEGATIVE Final  . MRSA, PCR 02/10/2017 NEGATIVE  NEGATIVE Final  . Staphylococcus aureus 02/10/2017 NEGATIVE  NEGATIVE Final   Comment: (NOTE) The Xpert SA Assay (FDA approved for NASAL specimens in patients 82 years of age and older), is one component of a comprehensive surveillance program. It is not intended to diagnose infection nor to guide or monitor treatment.      X-Rays:Dg Knee 1-2 Views Left  Result Date: 02/10/2017 CLINICAL DATA:  Preoperative examination prior to left knee replacement. EXAM: LEFT KNEE - 1-2 VIEW COMPARISON:  None in PACs FINDINGS: Standing AP and lateral views of the left knee reveal the bones to be subjectively adequately mineralized. There is no acute fracture or dislocation. There is exuberant heterotopic bone present along the  posterior aspect of the joint space and from the superior margin of the patella. There is severe narrowing of the medial joint compartment with milder narrowing of the patellofemoral compartment. The lateral compartment space is preserved. IMPRESSION: Severe degenerative joint space loss medially with moderate joint space loss of the patellofemoral compartment. Exuberant heterotopic bone formation associated with the posterior aspect of the joint space and  with the superior pole of the patella. Electronically Signed   By: David  Martinique M.D.   On: 02/10/2017 10:19   Dg Knee Left Port  Result Date: 02/12/2017 CLINICAL DATA: Postoperative total left knee replacement. EXAM: PORTABLE LEFT KNEE - 1-2 VIEW COMPARISON:  02/10/2017. FINDINGS: Total left knee replacement. Hardware intact. Anatomic alignment. No acute bony abnormality. IMPRESSION: Total left knee replacement. Anatomic alignment. No acute abnormality. Electronically Signed   By: Marcello Moores  Register   On: 02/12/2017 14:02    EKG: Orders placed or performed in visit on 04/04/16  . EKG 12-Lead     Hospital Course: [name] is a 51 y.o. who was admitted to [facility]. They were brought to the operating room on 02/12/2017 and underwent Procedure(s): LEFT TOTAL KNEE ARTHROPLASTY.  Patient tolerated the procedure well and was later transferred to the recovery room and then to the orthopaedic floor for postoperative care.  They were given PO and IV analgesics for pain control following their surgery.  They were given 24 hours of postoperative antibiotics of  Anti-infectives (From admission, onward)   Start     Dose/Rate Route Frequency Ordered Stop   02/12/17 1600  ceFAZolin (ANCEF) IVPB 2g/100 mL premix     2 g 200 mL/hr over 30 Minutes Intravenous Every 6 hours 02/12/17 1441 02/12/17 2206   02/12/17 1115  polymyxin B 500,000 Units, bacitracin 50,000 Units in sodium chloride 0.9 % 500 mL irrigation  Status:  Discontinued       As  needed 02/12/17 1115 02/12/17 1318   02/12/17 0745  ceFAZolin (ANCEF) IVPB 2g/100 mL premix     2 g 200 mL/hr over 30 Minutes Intravenous On call to O.R. 02/12/17 0732 02/12/17 1102     and started on DVT prophylaxis in the form of Aspirin, TED hose and SCDs.   PT and OT were ordered for total joint protocol.  Discharge planning consulted to help with postop disposition and equipment needs.  Patient had a fair night on the evening of surgery.  They started to get up OOB with therapy on day one. Hemovac drain was pulled without difficulty.  Continued to work with therapy into day two.  By day two, the patient had progressed with therapy and meeting their goals.  Incision was healing well.  Patient was seen in rounds and was ready to go home.   Diet: Regular diet Activity:WBAT Follow-up:in 10-14 days Disposition - Home Discharged Condition: good   Discharge Instructions    Call MD / Call 911   Complete by:  As directed    If you experience chest pain or shortness of breath, CALL 911 and be transported to the hospital emergency room.  If you develope a fever above 101 F, pus (white drainage) or increased drainage or redness at the wound, or calf pain, call your surgeon's office.   Constipation Prevention   Complete by:  As directed    Drink plenty of fluids.  Prune juice may be helpful.  You may use a stool softener, such as Colace (over the counter) 100 mg twice a day.  Use MiraLax (over the counter) for constipation as needed.   Diet - low sodium heart healthy   Complete by:  As directed    Increase activity slowly as tolerated   Complete by:  As directed    Weight bearing as tolerated   Complete by:  As directed    Laterality:  left   Extremity:  Lower  Allergies as of 02/14/2017   No Known Allergies     Medication List    STOP taking these medications   oxyCODONE-acetaminophen 5-325 MG tablet Commonly known as:  PERCOCET/ROXICET     TAKE these medications   aspirin 325  MG EC tablet Take 1 tablet (325 mg total) by mouth 2 (two) times daily after a meal. What changed:    medication strength  how much to take  when to take this   atorvastatin 80 MG tablet Commonly known as:  LIPITOR TAKE 1 TABLET (80 MG TOTAL) BY MOUTH DAILY AT 6 PM. What changed:  how much to take   docusate sodium 100 MG capsule Commonly known as:  COLACE Take 1 capsule (100 mg total) by mouth 2 (two) times daily.   methocarbamol 500 MG tablet Commonly known as:  ROBAXIN Take 1 tablet (500 mg total) by mouth every 6 (six) hours as needed for muscle spasms.   metoprolol succinate 25 MG 24 hr tablet Commonly known as:  TOPROL XL 1/2 TABLET BY MOUTH DAILY   multivitamin with minerals Tabs tablet Take 1 tablet by mouth daily.   NASACORT ALLERGY 24HR NA Place 1 spray into the nose daily as needed (allergies).   oxyCODONE 5 MG immediate release tablet Commonly known as:  Oxy IR/ROXICODONE Take 1-2 tablets (5-10 mg total) by mouth every 4 (four) hours as needed for moderate pain or severe pain ((score 4 to 6)).   polyethylene glycol packet Commonly known as:  MIRALAX / GLYCOLAX Take 17 g by mouth daily as needed for mild constipation.   VITAMIN C PO Take 125 mg by mouth daily.            Discharge Care Instructions  (From admission, onward)        Start     Ordered   02/14/17 0000  Weight bearing as tolerated    Question Answer Comment  Laterality left   Extremity Lower      02/14/17 0929     Follow-up Information    [name], MD Follow up in 2 week(s).   Specialty:  Orthopedic Surgery Contact information: [address] [place] [address] [phone]        [facility] Follow up.   Specialty:  [place] Why:  physical therapy Contact information: [place] [address] [phone]           Signed: Lacie Draft PA-C Orthopaedic Surgery 02/17/2017, 2:36 PM

## 2017-03-05 ENCOUNTER — Ambulatory Visit: Payer: BLUE CROSS/BLUE SHIELD | Admitting: Physical Therapy

## 2017-03-09 ENCOUNTER — Encounter: Payer: Self-pay | Admitting: Physical Therapy

## 2017-03-09 ENCOUNTER — Ambulatory Visit: Payer: BLUE CROSS/BLUE SHIELD | Attending: Specialist | Admitting: Physical Therapy

## 2017-03-09 DIAGNOSIS — M25562 Pain in left knee: Secondary | ICD-10-CM | POA: Insufficient documentation

## 2017-03-09 DIAGNOSIS — G8929 Other chronic pain: Secondary | ICD-10-CM | POA: Insufficient documentation

## 2017-03-09 DIAGNOSIS — M25662 Stiffness of left knee, not elsewhere classified: Secondary | ICD-10-CM | POA: Diagnosis present

## 2017-03-09 DIAGNOSIS — R600 Localized edema: Secondary | ICD-10-CM | POA: Diagnosis present
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
## 2017-03-12 ENCOUNTER — Ambulatory Visit: Payer: BLUE CROSS/BLUE SHIELD | Attending: Specialist | Admitting: *Deleted

## 2017-03-12 DIAGNOSIS — R600 Localized edema: Secondary | ICD-10-CM | POA: Diagnosis not present

## 2017-03-12 DIAGNOSIS — M25562 Pain in left knee: Secondary | ICD-10-CM | POA: Insufficient documentation

## 2017-03-12 DIAGNOSIS — G8929 Other chronic pain: Secondary | ICD-10-CM | POA: Diagnosis present

## 2017-03-12 DIAGNOSIS — M25662 Stiffness of left knee, not elsewhere classified: Secondary | ICD-10-CM | POA: Diagnosis present

## 2017-03-12 NOTE — Therapy (Signed)
[facility] [address] [place], [place], [address] Phone: [phone]   Fax:  [phone]  Physical Therapy Treatment  Patient Details  Name: [name] MRN: [id number] Date of Birth: September 21, 1965 Referring Provider: [name] MD   Encounter Date: 03/12/2017  PT End of Session - 03/12/17 0825    Visit Number  2    Number of Visits  12    Date for PT Re-Evaluation  04/06/17    Authorization Type  FOTO at leasr every 5th visit.    PT Start Time  0815    PT Stop Time  0912    PT Time Calculation (min)  57 min       Past Medical History:  Diagnosis Date  . Arthritis   . CAD S/P percutaneous coronary angioplasty    a. s/p NSTEMI 7/17:  EF 55%. pLAD 40%, mLAD 50%, dLCx 20%, OM2 100%,>> PCI: 2.5 x 14 mm Resolute DES to OM2   . GERD (gastroesophageal reflux disease)   . History of non-ST elevation myocardial infarction (NSTEMI)    Noted to have occluded OM 2 treated with DES stent  . Hyperlipidemia 09/17/2015  . Hypertension   . Hypertensive heart disease 09/17/2015  . Myocardial infarction (HCC)    11/2015  . Pneumonia    1998  . PONV (postoperative nausea and vomiting)    After Cath    Past Surgical History:  Procedure Laterality Date  . BACK SURGERY     x2 2003 and 2005  . CARDIAC CATHETERIZATION N/A 09/17/2015   Procedure: Left Heart Cath and Coronary Angiography;  Surgeon: [name], MD;  Location: [facility] INVASIVE CV LAB;  Service: Cardiovascular: EF 55%. pLAD 40%, mLAD 50%, dLCx 20%, OM2 100%,>> PCI: 2.5 x 14 mm Resolute DES to OM2   . CARDIAC CATHETERIZATION N/A 09/17/2015   Procedure: Coronary Stent Intervention;  Surgeon: [name], MD;  Location: [facility] INVASIVE CV LAB;  Service: Cardiovascular:  PCI: 2.5 x 14 mm Resolute DES to OM2   . KNEE ARTHROSCOPY     Left 1994  . ROTATOR CUFF REPAIR Right   . TOTAL KNEE ARTHROPLASTY Left 02/12/2017   Procedure: LEFT TOTAL KNEE ARTHROPLASTY;  Surgeon: [name], MD;   Location: [facility];  Service: Orthopedics;  Laterality: Left;  120 mins  . TRANSTHORACIC ECHOCARDIOGRAM  09/17/2015   Normal LV size and thickness. EF 50-55%. No regional wall motion abnormality. GR 1 DD.    There were no vitals filed for this visit.                   [facility] Adult PT Treatment/Exercise - 03/12/17 0001      Ambulation/Gait   Gait Comments  The patient walks with his left knee in flexion with a straight cane.      Exercises   Exercises  Knee/Hip      Knee/Hip Exercises: Aerobic   Nustep  Level 3 x 15 minutes moving forward x 1 to increase flexion. Seat 10,9      Knee/Hip Exercises: Standing   Forward Lunges  Left;3 sets;10 reps 8inch step      Knee/Hip Exercises: Supine   Other Supine Knee/Hip Exercises  2# overpressure for knee extension x 5 mins      Modalities   Modalities  Electrical Stimulation;Vasopneumatic      Electrical Stimulation   Electrical Stimulation Location  Left knee.  1-10hz  IFC x 15 mins     Electrical Stimulation Goals  Edema;Pain      Vasopneumatic   Number Minutes Vasopneumatic   15 minutes    Vasopnuematic Location   -- Left knee.    Vasopneumatic Pressure  Medium    Vasopneumatic Temperature   36      Manual Therapy   Manual Therapy  Soft tissue mobilization;Passive ROM    Passive ROM  Patella mobs, Scar mobs, and passive extension                  PT Long Term Goals - 03/09/17 1056      PT LONG TERM GOAL #1   Title  Independent with a HEP.    Time  4    Period  Weeks    Status  New      PT LONG TERM GOAL #2   Title  Active left knee flexion to 115 degrees+ so the patient can perform functional tasks and do so with pain not > 2-3/10.    Time  4    Period  Weeks    Status  New      PT LONG TERM GOAL #3   Title  Increase left knee strength to a solid 4+/5 to provide good stability for accomplishment of functional activities.    Time  4    Period  Weeks    Status  New      PT LONG TERM GOAL #4    Title  Full left active knee extension in order to normalize gait.    Time  4    Period  Weeks    Status  New      PT LONG TERM GOAL #5   Title  Perform a reciprocating stair gait with one railing with pain not > 2-3/10.    Time  4    Period  Weeks    Status  New            Plan - 03/12/17 [id number]    Clinical Impression Statement  Pt arrived to clinic doing fairly well today, but with no AD and antalgic gait and flexed LT knee.  Rx focused on ROM both ways and was instructed in HEPs for carry-over at home  atleast 5x daily. Normal modality reponse.    Clinical Presentation  Stable    Clinical Decision Making  Low    Rehab Potential  Good    PT Frequency  3x / week    PT Duration  4 weeks    PT Treatment/Interventions  ADLs/Self Care Home Management;Electrical Stimulation;Cryotherapy;Therapeutic exercise;Therapeutic activities;Stair training;Gait training;Patient/family education;Passive range of motion;Manual techniques;Vasopneumatic Device    PT Next Visit Plan  Left knee PROM; prone hang; PRE's; e'stim and vasopneumatic.    Consulted and Agree with Plan of Care  Patient       Patient will benefit from skilled therapeutic intervention in order to improve the following deficits and impairments:  Abnormal gait, Decreased activity tolerance, Decreased range of motion, Increased edema, Pain, Decreased strength  Visit Diagnosis: Localized edema  Chronic pain of left knee  Stiffness of left knee, not elsewhere classified     Problem List Patient Active Problem List   Diagnosis Date Noted  . Primary osteoarthritis of left knee 02/12/2017  . 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 Coronary artery disease involving native coronary artery of native heart with unstable angina pectoris (HCC) 09/25/2015  . Status post coronary artery stent placement   . Hypertensive heart disease 09/17/2015  . Hyperlipidemia with target low density lipoprotein (  LDL) cholesterol less than 70 mg/dL  96/29/5284  . NSTEMI (non-ST elevated myocardial infarction) (HCC) 09/16/2015    Bryer Gottsch,CHRIS, PTA 03/12/2017, 9:12 AM  [facility] [address] [place], [place], [address] Phone: [phone]   Fax:  [phone]  Name: [name] MRN: [id number] Date of Birth: [birth year]

## 2017-03-13 ENCOUNTER — Ambulatory Visit: Payer: BLUE CROSS/BLUE SHIELD | Admitting: Physical Therapy

## 2017-03-13 ENCOUNTER — Encounter: Payer: Self-pay | Admitting: Physical Therapy

## 2017-03-13 DIAGNOSIS — R600 Localized edema: Secondary | ICD-10-CM | POA: Diagnosis not present

## 2017-03-13 DIAGNOSIS — M25662 Stiffness of left knee, not elsewhere classified: Secondary | ICD-10-CM

## 2017-03-13 DIAGNOSIS — M25562 Pain in left knee: Secondary | ICD-10-CM

## 2017-03-13 DIAGNOSIS — G8929 Other chronic pain: Secondary | ICD-10-CM

## 2017-03-13 NOTE — Therapy (Signed)
[facility] [address] [place], [place], [address] Phone: [phone]   Fax:  [phone]  Physical Therapy Treatment  Patient Details  Name: [name] MRN: [id number] Date of Birth: 1966/02/14 Referring Provider: [name] MD   Encounter Date: 03/13/2017  PT End of Session - 03/13/17 0731    Visit Number  3    Number of Visits  12    Date for PT Re-Evaluation  04/06/17    Authorization Type  FOTO at leasr every 5th visit.    PT Start Time  0727    PT Stop Time  0820    PT Time Calculation (min)  53 min    Activity Tolerance  Patient tolerated treatment well    Behavior During Therapy  [facility] for tasks assessed/performed       Past Medical History:  Diagnosis Date  . Arthritis   . CAD S/P percutaneous coronary angioplasty    a. s/p NSTEMI 7/17:  EF 55%. pLAD 40%, mLAD 50%, dLCx 20%, OM2 100%,>> PCI: 2.5 x 14 mm Resolute DES to OM2   . GERD (gastroesophageal reflux disease)   . History of non-ST elevation myocardial infarction (NSTEMI)    Noted to have occluded OM 2 treated with DES stent  . Hyperlipidemia 09/17/2015  . Hypertension   . Hypertensive heart disease 09/17/2015  . Myocardial infarction (HCC)    11/2015  . Pneumonia    1998  . PONV (postoperative nausea and vomiting)    After Cath    Past Surgical History:  Procedure Laterality Date  . BACK SURGERY     x2 2003 and 2005  . CARDIAC CATHETERIZATION N/A 09/17/2015   Procedure: Left Heart Cath and Coronary Angiography;  Surgeon: [name], MD;  Location: [facility] INVASIVE CV LAB;  Service: Cardiovascular: EF 55%. pLAD 40%, mLAD 50%, dLCx 20%, OM2 100%,>> PCI: 2.5 x 14 mm Resolute DES to OM2   . CARDIAC CATHETERIZATION N/A 09/17/2015   Procedure: Coronary Stent Intervention;  Surgeon: [name], MD;  Location: [facility] INVASIVE CV LAB;  Service: Cardiovascular:  PCI: 2.5 x 14 mm Resolute DES to OM2   . KNEE ARTHROSCOPY     Left 1994  . ROTATOR CUFF REPAIR Right    . TOTAL KNEE ARTHROPLASTY Left 02/12/2017   Procedure: LEFT TOTAL KNEE ARTHROPLASTY;  Surgeon: [name], MD;  Location: [facility];  Service: Orthopedics;  Laterality: Left;  120 mins  . TRANSTHORACIC ECHOCARDIOGRAM  09/17/2015   Normal LV size and thickness. EF 50-55%. No regional wall motion abnormality. GR 1 DD.    There were no vitals filed for this visit.  Subjective Assessment - 03/13/17 0729    Subjective  Reports that he is sore this morning and has pain with movement.    Pertinent History  Chronic right knee pain.  MI.  Back surgery. Right RTC repair.    How long can you walk comfortably?  Short distances around home with straight cane.    Patient Stated Goals  Get around without knee pain.    Currently in Pain?  Yes    Pain Score  4     Pain Location  Knee    Pain Orientation  Left    Pain Descriptors / Indicators  Sore    Pain Type  Surgical pain    Pain Onset  More than a month ago    Pain Frequency  Intermittent    Aggravating Factors   Movement  [facility] PT Assessment - 03/13/17 0001      Assessment   Medical Diagnosis  Left total knee replacement.    Onset Date/Surgical Date  02/12/17    Next MD Visit  03/27/2017      Restrictions   Weight Bearing Restrictions  No      ROM / Strength   AROM / PROM / Strength  AROM      AROM   Overall AROM   Deficits    AROM Assessment Site  Knee    Right/Left Knee  Left    Left Knee Extension  14    Left Knee Flexion  81                  [facility] Adult PT Treatment/Exercise - 03/13/17 0001      Knee/Hip Exercises: Aerobic   Nustep  L4, seat 8-7 x15 min      Knee/Hip Exercises: Standing   Hip Flexion  AROM;Left;20 reps;Knee bent    Forward Lunges  Left;2 sets;10 reps;5 seconds    Hip Abduction  AROM;Left;2 sets;10 reps;Knee straight      Knee/Hip Exercises: Supine   Short Arc Quad Sets  AROM;Left;2 sets;10 reps      Modalities   Modalities  Occupational psychologist Location  L knee    Electrical Stimulation Action  IFC    Electrical Stimulation Parameters  1-10 hz x15 min    Electrical Stimulation Goals  Edema;Pain      Vasopneumatic   Number Minutes Vasopneumatic   15 minutes    Vasopnuematic Location   Knee    Vasopneumatic Pressure  Medium    Vasopneumatic Temperature   34      Manual Therapy   Manual Therapy  Passive ROM    Passive ROM  PROM of L knee into flexion, extension with holds at end range                  PT Long Term Goals - 03/09/17 1056      PT LONG TERM GOAL #1   Title  Independent with a HEP.    Time  4    Period  Weeks    Status  New      PT LONG TERM GOAL #2   Title  Active left knee flexion to 115 degrees+ so the patient can perform functional tasks and do so with pain not > 2-3/10.    Time  4    Period  Weeks    Status  New      PT LONG TERM GOAL #3   Title  Increase left knee strength to a solid 4+/5 to provide good stability for accomplishment of functional activities.    Time  4    Period  Weeks    Status  New      PT LONG TERM GOAL #4   Title  Full left active knee extension in order to normalize gait.    Time  4    Period  Weeks    Status  New      PT LONG TERM GOAL #5   Title  Perform a reciprocating stair gait with one railing with pain not > 2-3/10.    Time  4    Period  Weeks    Status  New            Plan - 03/13/17 0818    Clinical  Impression Statement  Patient arrived to clinic with reports of increased soreness and edema in R knee. Patient able to complete exercises as desired with reports of L hip fatigue and muscle weakness. Patient moderately limited with PROM in L knee secondary to discomfort and edema. AROM of L knee measured as 14-81 deg. Patient educated that he could use TENS unit for pain if he needed to as well as icing and elevating every 2-3 hours for 15-20 minutes with full extension. Normal modalities response noted  following removal of the modalities.     Rehab Potential  Good    PT Frequency  3x / week    PT Duration  4 weeks    PT Treatment/Interventions  ADLs/Self Care Home Management;Electrical Stimulation;Cryotherapy;Therapeutic exercise;Therapeutic activities;Stair training;Gait training;Patient/family education;Passive range of motion;Manual techniques;Vasopneumatic Device    PT Next Visit Plan  Left knee PROM; prone hang; PRE's; e'stim and vasopneumatic.    Consulted and Agree with Plan of Care  Patient       Patient will benefit from skilled therapeutic intervention in order to improve the following deficits and impairments:  Abnormal gait, Decreased activity tolerance, Decreased range of motion, Increased edema, Pain, Decreased strength  Visit Diagnosis: Localized edema  Chronic pain of left knee  Stiffness of left knee, not elsewhere classified     Problem List Patient Active Problem List   Diagnosis Date Noted  . Primary osteoarthritis of left knee 02/12/2017  . 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 Coronary artery disease involving native coronary artery of native heart with unstable angina pectoris (HCC) 09/25/2015  . Status post coronary artery stent placement   . Hypertensive heart disease 09/17/2015  . Hyperlipidemia with target low density lipoprotein (LDL) cholesterol less than 70 mg/dL 16/10/960407/12/2015  . NSTEMI (non-ST elevated myocardial infarction) (HCC) 09/16/2015    Marvell FullerKelsey P Kennon, PTA 03/13/2017, 8:35 AM  [facility] [address] [place], [place], [address] Phone: [phone]   Fax:  [phone]  Name: Marcello FennelKendall M Blaney MRN: [id number] Date of Birth: 1966-02-22

## 2017-03-16 ENCOUNTER — Ambulatory Visit: Payer: BLUE CROSS/BLUE SHIELD | Admitting: Physical Therapy

## 2017-03-16 ENCOUNTER — Encounter: Payer: Self-pay | Admitting: Physical Therapy

## 2017-03-16 DIAGNOSIS — R600 Localized edema: Secondary | ICD-10-CM

## 2017-03-16 DIAGNOSIS — M25662 Stiffness of left knee, not elsewhere classified: Secondary | ICD-10-CM

## 2017-03-16 DIAGNOSIS — M25562 Pain in left knee: Secondary | ICD-10-CM

## 2017-03-16 DIAGNOSIS — G8929 Other chronic pain: Secondary | ICD-10-CM

## 2017-03-16 NOTE — Therapy (Signed)
[facility] [address] [place], [place], [address] Phone: [phone]   Fax:  [phone]  Physical Therapy Treatment  Patient Details  Name: [name] MRN: [id number] Date of Birth: 1965-05-03 Referring Provider: [name] MD   Encounter Date: 03/16/2017  PT End of Session - 03/16/17 0939    Visit Number  4    Number of Visits  12    Date for PT Re-Evaluation  04/06/17    Authorization Type  FOTO at leasr every 5th visit.    PT Start Time  [phone]    PT Stop Time  1046    PT Time Calculation (min)  57 min    Activity Tolerance  Patient tolerated treatment well    Behavior During Therapy  WFL for tasks assessed/performed       Past Medical History:  Diagnosis Date  . Arthritis   . CAD S/P percutaneous coronary angioplasty    a. s/p NSTEMI 7/17:  EF 55%. pLAD 40%, mLAD 50%, dLCx 20%, OM2 100%,>> PCI: 2.5 x 14 mm Resolute DES to OM2   . GERD (gastroesophageal reflux disease)   . History of non-ST elevation myocardial infarction (NSTEMI)    Noted to have occluded OM 2 treated with DES stent  . Hyperlipidemia 09/17/2015  . Hypertension   . Hypertensive heart disease 09/17/2015  . Myocardial infarction (HCC)    11/2015  . Pneumonia    1998  . PONV (postoperative nausea and vomiting)    After Cath    Past Surgical History:  Procedure Laterality Date  . BACK SURGERY     x2 2003 and 2005  . CARDIAC CATHETERIZATION N/A 09/17/2015   Procedure: Left Heart Cath and Coronary Angiography;  Surgeon: [name], MD;  Location: [facility] INVASIVE CV LAB;  Service: Cardiovascular: EF 55%. pLAD 40%, mLAD 50%, dLCx 20%, OM2 100%,>> PCI: 2.5 x 14 mm Resolute DES to OM2   . CARDIAC CATHETERIZATION N/A 09/17/2015   Procedure: Coronary Stent Intervention;  Surgeon: [name], MD;  Location: [facility] INVASIVE CV LAB;  Service: Cardiovascular:  PCI: 2.5 x 14 mm Resolute DES to OM2   . KNEE ARTHROSCOPY     Left 1994  . ROTATOR CUFF REPAIR Right    . TOTAL KNEE ARTHROPLASTY Left 02/12/2017   Procedure: LEFT TOTAL KNEE ARTHROPLASTY;  Surgeon: [name], MD;  Location: [facility];  Service: Orthopedics;  Laterality: Left;  120 mins  . TRANSTHORACIC ECHOCARDIOGRAM  09/17/2015   Normal LV size and thickness. EF 50-55%. No regional wall motion abnormality. GR 1 DD.    There were no vitals filed for this visit.  Subjective Assessment - 03/16/17 0938    Subjective  Reports he is feeling better and feels like he is getting stronger.    Pertinent History  Chronic right knee pain.  MI.  Back surgery. Right RTC repair.    How long can you walk comfortably?  Short distances around home with straight cane.    Patient Stated Goals  Get around without knee pain.    Currently in Pain?  Yes    Pain Score  3     Pain Location  Knee    Pain Orientation  Left    Pain Descriptors / Indicators  Sore    Pain Type  Surgical pain    Pain Onset  More than a month ago    Pain Frequency  Intermittent    Aggravating Factors   Bending  [facility] PT Assessment - 03/16/17 0001      Assessment   Medical Diagnosis  Left total knee replacement.    Onset Date/Surgical Date  02/12/17    Next MD Visit  03/27/2017      Restrictions   Weight Bearing Restrictions  No      ROM / Strength   AROM / PROM / Strength  AROM      AROM   Overall AROM   Deficits    AROM Assessment Site  Knee    Right/Left Knee  Left    Left Knee Extension  -12    Left Knee Flexion  90                  [facility] Adult PT Treatment/Exercise - 03/16/17 0001      Knee/Hip Exercises: Aerobic   Nustep  L5, seat 8-6 x15 min      Knee/Hip Exercises: Standing   Forward Lunges  Left;3 sets;10 reps;3 seconds 84 deg    Functional Squat  20 reps    Rocker Board  3 minutes      Knee/Hip Exercises: Supine   Quad Sets  Strengthening;Left with VMS and 10# overpressure per [place] Applegate, MPT       Modalities   Modalities  Teacher, adult education Location  L VMO, Brewing technologist  VMS    Electrical Stimulation Parameters  10/10, 50 pps, 5 sec ramp, 300 usec x10 min with QS and 10# overpressure per [place] Applegate, MPT    Electrical Stimulation Goals  Neuromuscular facilitation      Vasopneumatic   Number Minutes Vasopneumatic   15 minutes    Vasopnuematic Location   Knee    Vasopneumatic Pressure  Medium    Vasopneumatic Temperature   34      Manual Therapy   Manual Therapy  Passive ROM    Passive ROM  PROM of L knee into flexion, extension with holds at end range                  PT Long Term Goals - 03/09/17 1056      PT LONG TERM GOAL #1   Title  Independent with a HEP.    Time  4    Period  Weeks    Status  New      PT LONG TERM GOAL #2   Title  Active left knee flexion to 115 degrees+ so the patient can perform functional tasks and do so with pain not > 2-3/10.    Time  4    Period  Weeks    Status  New      PT LONG TERM GOAL #3   Title  Increase left knee strength to a solid 4+/5 to provide good stability for accomplishment of functional activities.    Time  4    Period  Weeks    Status  New      PT LONG TERM GOAL #4   Title  Full left active knee extension in order to normalize gait.    Time  4    Period  Weeks    Status  New      PT LONG TERM GOAL #5   Title  Perform a reciprocating stair gait with one railing with pain not > 2-3/10.    Time  4    Period  Weeks  Status  New            Plan - 03/16/17 1000    Clinical Impression Statement  Patiemt noted minimal improvement with pain and ROM today. Patient required VCs for exercise technique as well as equalized weightbearing for functional squats. Lack of L quad activation noted today thus VMS initiated with 10# overpressure via ankleweight per recommendation by [place] Applegate, MPT. Normal modalities response noted following removal of the modalities.    Rehab  Potential  Good    PT Frequency  3x / week    PT Duration  4 weeks    PT Treatment/Interventions  ADLs/Self Care Home Management;Electrical Stimulation;Cryotherapy;Therapeutic exercise;Therapeutic activities;Stair training;Gait training;Patient/family education;Passive range of motion;Manual techniques;Vasopneumatic Device    PT Next Visit Plan  Left knee PROM; prone hang; PRE's; e'stim and vasopneumatic.    Consulted and Agree with Plan of Care  Patient       Patient will benefit from skilled therapeutic intervention in order to improve the following deficits and impairments:  Abnormal gait, Decreased activity tolerance, Decreased range of motion, Increased edema, Pain, Decreased strength  Visit Diagnosis: Localized edema  Chronic pain of left knee  Stiffness of left knee, not elsewhere classified     Problem List Patient Active Problem List   Diagnosis Date Noted  . Primary osteoarthritis of left knee 02/12/2017  . 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 Coronary artery disease involving native coronary artery of native heart with unstable angina pectoris (HCC) 09/25/2015  . Status post coronary artery stent placement   . Hypertensive heart disease 09/17/2015  . Hyperlipidemia with target low density lipoprotein (LDL) cholesterol less than 70 mg/dL 82/95/621307/12/2015  . NSTEMI (non-ST elevated myocardial infarction) (HCC) 09/16/2015    Marvell FullerKelsey P Daryus Sowash, PTA 03/16/2017, 11:00 AM  [facility] [address] [place], [place], [address] Phone: [phone]   Fax:  [phone]  Name: Victor FennelKendall M [name] MRN: [id number] Date of Birth: 1966/02/02

## 2017-03-18 ENCOUNTER — Ambulatory Visit: Payer: BLUE CROSS/BLUE SHIELD | Admitting: Physical Therapy

## 2017-03-18 ENCOUNTER — Encounter: Payer: Self-pay | Admitting: Physical Therapy

## 2017-03-18 DIAGNOSIS — M25662 Stiffness of left knee, not elsewhere classified: Secondary | ICD-10-CM

## 2017-03-18 DIAGNOSIS — M25562 Pain in left knee: Secondary | ICD-10-CM

## 2017-03-18 DIAGNOSIS — R600 Localized edema: Secondary | ICD-10-CM

## 2017-03-18 DIAGNOSIS — G8929 Other chronic pain: Secondary | ICD-10-CM

## 2017-03-18 NOTE — Therapy (Signed)
[facility] [address] [place], [place], [address] Phone: [phone]   Fax:  [phone]  Physical Therapy Treatment  Patient Details  Name: Marcello FennelKendall M Spallone MRN: [id number] Date of Birth: May 05, 1965 Referring Provider: Jene EveryJeffrey Beane MD   Encounter Date: 03/18/2017  PT End of Session - 03/18/17 0729    Visit Number  5    Number of Visits  12    Date for PT Re-Evaluation  04/06/17    Authorization Type  FOTO at leasr every 5th visit.    PT Start Time  0732    PT Stop Time  0827    PT Time Calculation (min)  55 min    Activity Tolerance  Patient tolerated treatment well    Behavior During Therapy  WFL for tasks assessed/performed       Past Medical History:  Diagnosis Date  . Arthritis   . CAD S/P percutaneous coronary angioplasty    a. s/p NSTEMI 7/17:  EF 55%. pLAD 40%, mLAD 50%, dLCx 20%, OM2 100%,>> PCI: 2.5 x 14 mm Resolute DES to OM2   . GERD (gastroesophageal reflux disease)   . History of non-ST elevation myocardial infarction (NSTEMI)    Noted to have occluded OM 2 treated with DES stent  . Hyperlipidemia 09/17/2015  . Hypertension   . Hypertensive heart disease 09/17/2015  . Myocardial infarction (HCC)    11/2015  . Pneumonia    1998  . PONV (postoperative nausea and vomiting)    After Cath    Past Surgical History:  Procedure Laterality Date  . BACK SURGERY     x2 2003 and 2005  . CARDIAC CATHETERIZATION N/A 09/17/2015   Procedure: Left Heart Cath and Coronary Angiography;  Surgeon: [name], MD;  [facility];  Service: Cardiovascular: EF 55%. pLAD 40%, mLAD 50%, dLCx 20%, OM2 100%,>> PCI: 2.5 x 14 mm Resolute DES to OM2   . CARDIAC CATHETERIZATION N/A 09/17/2015   Procedure: Coronary Stent Intervention;  Surgeon: [name], MD;  [facility];  Service: Cardiovascular:  PCI: 2.5 x 14 mm Resolute DES to OM2   . KNEE ARTHROSCOPY     Left 1994  . ROTATOR CUFF REPAIR Right    . TOTAL KNEE ARTHROPLASTY Left 02/12/2017   Procedure: LEFT TOTAL KNEE ARTHROPLASTY;  Surgeon: [name], Jeffrey, MD;  Location: [facility];  Service: Orthopedics;  Laterality: Left;  120 mins  . TRANSTHORACIC ECHOCARDIOGRAM  09/17/2015   Normal LV size and thickness. EF 50-55%. No regional wall motion abnormality. GR 1 DD.    There were no vitals filed for this visit.  Subjective Assessment - 03/18/17 0729    Subjective  Reports that he didn't have soreness following last treatment. Reports that he has more pain inferior to the L patella than along B sides of knee.    Pertinent History  Chronic right knee pain.  MI.  Back surgery. Right RTC repair.    How long can you walk comfortably?  Short distances around home with straight cane.    Patient Stated Goals  Get around without knee pain.    Currently in Pain?  Yes    Pain Score  3     Pain Location  Knee    Pain Orientation  Left    Pain Descriptors / Indicators  Discomfort    Pain Type  Surgical pain    Pain Onset  More than a month ago    Pain  Frequency  Intermittent    Aggravating Factors   Knee flexion         [facility] PT Assessment - 03/18/17 0001      Assessment   Medical Diagnosis  Left total knee replacement.    Onset Date/Surgical Date  02/12/17    Next MD Visit  03/27/2017      Restrictions   Weight Bearing Restrictions  No                  [facility] Adult PT Treatment/Exercise - 03/18/17 0001      Knee/Hip Exercises: Aerobic   Nustep  L4, seat 8-6 x16 min      Knee/Hip Exercises: Standing   Forward Lunges  Left;3 sets;10 reps;3 seconds    Functional Squat  20 reps      Knee/Hip Exercises: Supine   Quad Sets  Strengthening;Left with VMS for L quad neuro re-ed      Modalities   Modalities  Programmer, applications Location  L VMO/ Research scientist (medical) Action  VMS with QS    Electrical Stimulation Parameters  10/10, 50 pps, 5 sec  ramp, 300 usec x10 min with QS    Electrical Stimulation Goals  Neuromuscular facilitation      Vasopneumatic   Number Minutes Vasopneumatic   15 minutes    Vasopnuematic Location   Knee    Vasopneumatic Pressure  Medium    Vasopneumatic Temperature   34                  PT Long Term Goals - 03/09/17 1056      PT LONG TERM GOAL #1   Title  Independent with a HEP.    Time  4    Period  Weeks    Status  New      PT LONG TERM GOAL #2   Title  Active left knee flexion to 115 degrees+ so the patient can perform functional tasks and do so with pain not > 2-3/10.    Time  4    Period  Weeks    Status  New      PT LONG TERM GOAL #3   Title  Increase left knee strength to a solid 4+/5 to provide good stability for accomplishment of functional activities.    Time  4    Period  Weeks    Status  New      PT LONG TERM GOAL #4   Title  Full left active knee extension in order to normalize gait.    Time  4    Period  Weeks    Status  New      PT LONG TERM GOAL #5   Title  Perform a reciprocating stair gait with one railing with pain not > 2-3/10.    Time  4    Period  Weeks    Status  New            Plan - 03/18/17 0820    Clinical Impression Statement  Patient presented in clinic with minimal discomfort but no soreness as expected by patient following last treatment. Prone hangs progressed to weights to assist with knee extension with no complaint by patient. Patient continues to favor LLE during squats but VC'd to equalize weight bearing. Patient encouraged to continue walking as well as ROM and extension stretching at home and to progress to weights with prone  hang. Normal modalities response noted following removal of the modalities. Patient educated on scar mob technique but educated to hold off on applying lotion to incision as he has small areas of scabbing present in mid incision.    Rehab Potential  Good    PT Frequency  3x / week    PT Duration  4 weeks     PT Treatment/Interventions  ADLs/Self Care Home Management;Electrical Stimulation;Cryotherapy;Therapeutic exercise;Therapeutic activities;Stair training;Gait training;Patient/family education;Passive range of motion;Manual techniques;Vasopneumatic Device    PT Next Visit Plan  Left knee PROM; prone hang; PRE's; e'stim and vasopneumatic.    Consulted and Agree with Plan of Care  Patient       Patient will benefit from skilled therapeutic intervention in order to improve the following deficits and impairments:  Abnormal gait, Decreased activity tolerance, Decreased range of motion, Increased edema, Pain, Decreased strength  Visit Diagnosis: Localized edema  Chronic pain of left knee  Stiffness of left knee, not elsewhere classified     Problem List Patient Active Problem List   Diagnosis Date Noted  . Primary osteoarthritis of left knee 02/12/2017  . 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 Coronary artery disease involving native coronary artery of native heart with unstable angina pectoris (HCC) 09/25/2015  . Status post coronary artery stent placement   . Hypertensive heart disease 09/17/2015  . Hyperlipidemia with target low density lipoprotein (LDL) cholesterol less than 70 mg/dL 40/98/1191  . NSTEMI (non-ST elevated myocardial infarction) (HCC) 09/16/2015    Marvell Fuller, PTA 03/18/2017, 8:31 AM  [facility] [address] [place], [place], [address] Phone: [phone]   Fax:  [phone]  Name: LONEY DOMINGO MRN: [id number] Date of Birth: [birth year]

## 2017-03-20 ENCOUNTER — Ambulatory Visit: Payer: BLUE CROSS/BLUE SHIELD | Admitting: Physical Therapy

## 2017-03-20 ENCOUNTER — Encounter: Payer: Self-pay | Admitting: Physical Therapy

## 2017-03-20 DIAGNOSIS — G8929 Other chronic pain: Secondary | ICD-10-CM

## 2017-03-20 DIAGNOSIS — R600 Localized edema: Secondary | ICD-10-CM | POA: Diagnosis not present

## 2017-03-20 DIAGNOSIS — M25662 Stiffness of left knee, not elsewhere classified: Secondary | ICD-10-CM

## 2017-03-20 DIAGNOSIS — M25562 Pain in left knee: Secondary | ICD-10-CM

## 2017-03-20 NOTE — Therapy (Signed)
[facility] [address] [place], [place], [address] Phone: [phone]   Fax:  [phone]  Physical Therapy Treatment  Patient Details  Name: [name] MRN: [id number] Date of Birth: [birth year] Referring Provider: [name] MD   Encounter Date: 03/20/2017  PT End of Session - 03/20/17 0738    Visit Number  6    Number of Visits  12    Date for PT Re-Evaluation  04/06/17    Authorization Type  FOTO at leasr every 5th visit.    PT Start Time  0732    PT Stop Time  0824    PT Time Calculation (min)  52 min    Activity Tolerance  Patient tolerated treatment well    Behavior During Therapy  [facility] for tasks assessed/performed       Past Medical History:  Diagnosis Date  . Arthritis   . CAD S/P percutaneous coronary angioplasty    a. s/p NSTEMI 7/17:  EF 55%. pLAD 40%, mLAD 50%, dLCx 20%, OM2 100%,>> PCI: 2.5 x 14 mm Resolute DES to OM2   . GERD (gastroesophageal reflux disease)   . History of non-ST elevation myocardial infarction (NSTEMI)    Noted to have occluded OM 2 treated with DES stent  . Hyperlipidemia 09/17/2015  . Hypertension   . Hypertensive heart disease 09/17/2015  . Myocardial infarction (HCC)    11/2015  . Pneumonia    1998  . PONV (postoperative nausea and vomiting)    After Cath    Past Surgical History:  Procedure Laterality Date  . BACK SURGERY     x2 2003 and 2005  . CARDIAC CATHETERIZATION N/A 09/17/2015   Procedure: Left Heart Cath and Coronary Angiography;  Surgeon: [name], MD;  Location: [facility] INVASIVE CV LAB;  Service: Cardiovascular: EF 55%. pLAD 40%, mLAD 50%, dLCx 20%, OM2 100%,>> PCI: 2.5 x 14 mm Resolute DES to OM2   . CARDIAC CATHETERIZATION N/A 09/17/2015   Procedure: Coronary Stent Intervention;  Surgeon: [name], MD;  Location: [facility] INVASIVE CV LAB;  Service: Cardiovascular:  PCI: 2.5 x 14 mm Resolute DES to OM2   . KNEE ARTHROSCOPY     Left 1994  . ROTATOR CUFF REPAIR  Right   . TOTAL KNEE ARTHROPLASTY Left 02/12/2017   Procedure: LEFT TOTAL KNEE ARTHROPLASTY;  Surgeon: [name], MD;  Location: [facility];  Service: Orthopedics;  Laterality: Left;  120 mins  . TRANSTHORACIC ECHOCARDIOGRAM  09/17/2015   Normal LV size and thickness. EF 50-55%. No regional wall motion abnormality. GR 1 DD.    There were no vitals filed for this visit.  Subjective Assessment - 03/20/17 0738    Subjective  Reports stiffness upon arrival.    Pertinent History  Chronic right knee pain.  MI.  Back surgery. Right RTC repair.    How long can you walk comfortably?  Short distances around home with straight cane.    Patient Stated Goals  Get around without knee pain.    Currently in Pain?  Other (Comment) No pain assessment provided upon arrival only reporting stiffness         [facility] PT Assessment - 03/20/17 0001      Assessment   Medical Diagnosis  Left total knee replacement.    Onset Date/Surgical Date  02/12/17    Next MD Visit  03/27/2017      Restrictions   Weight Bearing Restrictions  No      ROM / Strength  AROM / PROM / Strength  AROM      AROM   Overall AROM   Deficits    AROM Assessment Site  Knee    Right/Left Knee  Left    Left Knee Extension  -13    Left Knee Flexion  87                  [facility] Adult PT Treatment/Exercise - 03/20/17 0001      Knee/Hip Exercises: Aerobic   Nustep  L3, seat 7 x10 min      Knee/Hip Exercises: Standing   Forward Lunges  Left;3 sets;10 reps;3 seconds    Functional Squat  20 reps      Knee/Hip Exercises: Prone   Prone Knee Hang  Other (comment) x5 min with IASTW to B HS      Modalities   Modalities  Electrical Stimulation;Vasopneumatic      Programme researcher, broadcasting/film/videolectrical Stimulation   Electrical Stimulation Location  L VMO/ Research scientist (medical)Quad    Electrical Stimulation Action  VMS to QS    Electrical Stimulation Parameters  10/10, 50 pps, 5 sec, 300 usec x10 min    Electrical Stimulation Goals  Neuromuscular facilitation       Vasopneumatic   Number Minutes Vasopneumatic   15 minutes    Vasopnuematic Location   Knee    Vasopneumatic Pressure  Medium    Vasopneumatic Temperature   34                  PT Long Term Goals - 03/20/17 0813      PT LONG TERM GOAL #1   Title  Independent with a HEP.    Time  4    Period  Weeks    Status  Achieved      PT LONG TERM GOAL #2   Title  Active left knee flexion to 115 degrees+ so the patient can perform functional tasks and do so with pain not > 2-3/10.    Time  4    Period  Weeks    Status  On-going AROM L knee flexion 87 deg 03/20/2017      PT LONG TERM GOAL #3   Title  Increase left knee strength to a solid 4+/5 to provide good stability for accomplishment of functional activities.    Time  4    Period  Weeks    Status  On-going      PT LONG TERM GOAL #4   Title  Full left active knee extension in order to normalize gait.    Time  4    Period  Weeks    Status  On-going 13 deg from full extension of L knee 03/20/2017      PT LONG TERM GOAL #5   Title  Perform a reciprocating stair gait with one railing with pain not > 2-3/10.    Time  4    Period  Weeks    Status  On-going            Plan - 03/20/17 0802    Clinical Impression Statement  Patient tolerated today's treatment fair as he arrived with stiffness in L knee. Patient able to tolerate prone hang with IASTW to B HS to reduce muscle tightness that could be limiting ROM. AROM of L knee measured as 13-87 deg. VMS continued to assist in achieving proper L quad activation. Normal modalities response noted following removal of the modalities. Limited goal progression secondary to ROM limitations.  Rehab Potential  Good    PT Frequency  3x / week    PT Duration  4 weeks    PT Treatment/Interventions  ADLs/Self Care Home Management;Electrical Stimulation;Cryotherapy;Therapeutic exercise;Therapeutic activities;Stair training;Gait training;Patient/family education;Passive range of  motion;Manual techniques;Vasopneumatic Device    PT Next Visit Plan  Left knee PROM; prone hang; PRE's; e'stim and vasopneumatic.    Consulted and Agree with Plan of Care  Patient       Patient will benefit from skilled therapeutic intervention in order to improve the following deficits and impairments:  Abnormal gait, Decreased activity tolerance, Decreased range of motion, Increased edema, Pain, Decreased strength  Visit Diagnosis: Localized edema  Chronic pain of left knee  Stiffness of left knee, not elsewhere classified     Problem List Patient Active Problem List   Diagnosis Date Noted  . Primary osteoarthritis of left knee 02/12/2017  . 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 Coronary artery disease involving native coronary artery of native heart with unstable angina pectoris (HCC) 09/25/2015  . Status post coronary artery stent placement   . Hypertensive heart disease 09/17/2015  . Hyperlipidemia with target low density lipoprotein (LDL) cholesterol less than 70 mg/dL 96/06/5407  . NSTEMI (non-ST elevated myocardial infarction) (HCC) 09/16/2015    Marvell Fuller, PTA 03/20/2017, 8:31 AM  [facility] [address] [place], [place], [address] Phone: [phone]   Fax:  [phone]  Name: [name] MRN: [id number] Date of Birth: 12/14/1965

## 2017-03-23 ENCOUNTER — Ambulatory Visit: Payer: BLUE CROSS/BLUE SHIELD | Admitting: Physical Therapy

## 2017-03-23 ENCOUNTER — Encounter: Payer: Self-pay | Admitting: Physical Therapy

## 2017-03-23 DIAGNOSIS — M25662 Stiffness of left knee, not elsewhere classified: Secondary | ICD-10-CM

## 2017-03-23 DIAGNOSIS — G8929 Other chronic pain: Secondary | ICD-10-CM

## 2017-03-23 DIAGNOSIS — R600 Localized edema: Secondary | ICD-10-CM

## 2017-03-23 DIAGNOSIS — M25562 Pain in left knee: Secondary | ICD-10-CM

## 2017-03-23 NOTE — Therapy (Signed)
[facility] [address] [place], [place], [address] Phone: [phone]   Fax:  [phone]  Physical Therapy Treatment  Patient Details  Name: [name] MRN: [id number] Date of Birth: 03/14/1965 Referring Provider: Jene EveryJeffrey Beane MD   Encounter Date: 03/23/2017  PT End of Session - 03/23/17 0810    Visit Number  7    Number of Visits  12    Date for PT Re-Evaluation  04/06/17    Authorization Type  FOTO at leasr every 5th visit.    PT Start Time  0730    PT Stop Time  0826    PT Time Calculation (min)  56 min    Activity Tolerance  Patient tolerated treatment well    Behavior During Therapy  WFL for tasks assessed/performed       Past Medical History:  Diagnosis Date  . Arthritis   . CAD S/[name] percutaneous coronary angioplasty    a. s/[name] NSTEMI 7/17:  EF 55%. pLAD 40%, mLAD 50%, dLCx 20%, OM2 100%,>> PCI: 2.5 x 14 mm Resolute DES to OM2   . GERD (gastroesophageal reflux disease)   . History of non-ST elevation myocardial infarction (NSTEMI)    Noted to have occluded OM 2 treated with DES stent  . Hyperlipidemia 09/17/2015  . Hypertension   . Hypertensive heart disease 09/17/2015  . Myocardial infarction (HCC)    11/2015  . Pneumonia    1998  . PONV (postoperative nausea and vomiting)    After Cath    Past Surgical History:  Procedure Laterality Date  . BACK SURGERY     x2 2003 and 2005  . CARDIAC CATHETERIZATION N/A 09/17/2015   Procedure: Left Heart Cath and Coronary Angiography;  Surgeon: [name], MD;  [facility];  Service: Cardiovascular: EF 55%. pLAD 40%, mLAD 50%, dLCx 20%, OM2 100%,>> PCI: 2.5 x 14 mm Resolute DES to OM2   . CARDIAC CATHETERIZATION N/A 09/17/2015   Procedure: Coronary Stent Intervention;  Surgeon: [name], MD;  [facility];  Service: Cardiovascular:  PCI: 2.5 x 14 mm Resolute DES to OM2   . KNEE ARTHROSCOPY     Left 1994  . ROTATOR CUFF REPAIR  Right   . TOTAL KNEE ARTHROPLASTY Left 02/12/2017   Procedure: LEFT TOTAL KNEE ARTHROPLASTY;  Surgeon: [name], Jeffrey, MD;  Location: [facility];  Service: Orthopedics;  Laterality: Left;  120 mins  . TRANSTHORACIC ECHOCARDIOGRAM  09/17/2015   Normal LV size and thickness. EF 50-55%. No regional wall motion abnormality. GR 1 DD.    There were no vitals filed for this visit.  Subjective Assessment - 03/23/17 0733    Subjective  Patient arrived with c/o pain and stiffness in knee today    Pertinent History  Chronic right knee pain.  MI.  Back surgery. Right RTC repair.    How long can you walk comfortably?  Short distances around home with straight cane.    Patient Stated Goals  Get around without knee pain.    Currently in Pain?  Yes    Pain Score  4     Pain Location  Knee    Pain Orientation  Left    Pain Descriptors / Indicators  Discomfort;Tightness;Sore    Pain Type  Surgical pain    Pain Onset  More than a month ago    Pain Frequency  Intermittent    Aggravating Factors   ROM in knee  Pain Relieving Factors  at rest         [facility] PT Assessment - 03/23/17 0001      AROM   AROM Assessment Site  Knee    Right/Left Knee  Left    Left Knee Extension  -15    Left Knee Flexion  76      PROM   PROM Assessment Site  Knee    Right/Left Knee  Left    Left Knee Extension  -10    Left Knee Flexion  90                  [facility] Adult PT Treatment/Exercise - 03/23/17 0001      Knee/Hip Exercises: Aerobic   Nustep  11 min L4 adjusted for ROM      Electrical Stimulation   Electrical Stimulation Location  left knee    Electrical Stimulation Action  IFC    Electrical Stimulation Parameters  1-10ha x38min    Electrical Stimulation Goals  Pain      Vasopneumatic   Number Minutes Vasopneumatic   15 minutes    Vasopnuematic Location   Knee    Vasopneumatic Pressure  Medium      Manual Therapy   Manual Therapy  Passive ROM    Passive ROM  PROM of L knee into flexion,  extension with holds at end range                  PT Long Term Goals - 03/20/17 0813      PT LONG TERM GOAL #1   Title  Independent with a HEP.    Time  4    Period  Weeks    Status  Achieved      PT LONG TERM GOAL #2   Title  Active left knee flexion to 115 degrees+ so the patient can perform functional tasks and do so with pain not > 2-3/10.    Time  4    Period  Weeks    Status  On-going AROM L knee flexion 87 deg 03/20/2017      PT LONG TERM GOAL #3   Title  Increase left knee strength to a solid 4+/5 to provide good stability for accomplishment of functional activities.    Time  4    Period  Weeks    Status  On-going      PT LONG TERM GOAL #4   Title  Full left active knee extension in order to normalize gait.    Time  4    Period  Weeks    Status  On-going 13 deg from full extension of L knee 03/20/2017      PT LONG TERM GOAL #5   Title  Perform a reciprocating stair gait with one railing with pain not > 2-3/10.    Time  4    Period  Weeks    Status  On-going            Plan - 03/23/17 1610    Clinical Impression Statement  Patient tolerated treatment well today. Today focused on ROM today. Patient reported doing self stretches daily. Patient arrived with increased tightness in knee yet was able to increase range after manual stretching. Patient current goals ongoing due to ROM, strength and pain deficts.     Rehab Potential  Good    PT Frequency  3x / week    PT Duration  4 weeks    PT Treatment/Interventions  ADLs/Self  Care Home Management;Electrical Stimulation;Cryotherapy;Therapeutic exercise;Therapeutic activities;Stair training;Gait training;Patient/family education;Passive range of motion;Manual techniques;Vasopneumatic Device    PT Next Visit Plan  cont with POC ROM focus and MD note to (Dr. Shelle Iron on friday)    Consulted and Agree with Plan of Care  Patient       Patient will benefit from skilled therapeutic intervention in order to  improve the following deficits and impairments:  Abnormal gait, Decreased activity tolerance, Decreased range of motion, Increased edema, Pain, Decreased strength  Visit Diagnosis: Localized edema  Chronic pain of left knee  Stiffness of left knee, not elsewhere classified     Problem List Patient Active Problem List   Diagnosis Date Noted  . Primary osteoarthritis of left knee 02/12/2017  . 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 Coronary artery disease involving native coronary artery of native heart with unstable angina pectoris (HCC) 09/25/2015  . Status post coronary artery stent placement   . Hypertensive heart disease 09/17/2015  . Hyperlipidemia with target low density lipoprotein (LDL) cholesterol less than 70 mg/dL 16/12/9602  . NSTEMI (non-ST elevated myocardial infarction) (HCC) 09/16/2015    [name], [name], PTA 03/23/2017, 8:49 AM  [facility] [address] [place], [place], [address] Phone: [phone]   Fax:  [phone]  Name: [name] MRN: [id number] Date of Birth: Oct 16, 1965

## 2017-03-25 ENCOUNTER — Encounter: Payer: Self-pay | Admitting: Physical Therapy

## 2017-03-25 ENCOUNTER — Ambulatory Visit: Payer: BLUE CROSS/BLUE SHIELD | Admitting: Physical Therapy

## 2017-03-25 DIAGNOSIS — R600 Localized edema: Secondary | ICD-10-CM | POA: Diagnosis not present

## 2017-03-25 DIAGNOSIS — M25562 Pain in left knee: Secondary | ICD-10-CM

## 2017-03-25 DIAGNOSIS — M25662 Stiffness of left knee, not elsewhere classified: Secondary | ICD-10-CM

## 2017-03-25 DIAGNOSIS — G8929 Other chronic pain: Secondary | ICD-10-CM

## 2017-03-25 NOTE — Therapy (Signed)
[facility] [address] [place], [place], [address] Phone: [phone]   Fax:  [phone]  Physical Therapy Treatment  Patient Details  Name: [name] MRN: [id number] Date of Birth: [birth year] Referring Provider: Jene EveryJeffrey Beane MD   Encounter Date: 03/25/2017  PT End of Session - 03/25/17 0820    Visit Number  8    Number of Visits  12    Date for PT Re-Evaluation  04/06/17    Authorization Type  FOTO at leasr every 5th visit.    PT Start Time  [phone]    PT Stop Time  0844    PT Time Calculation (min)  73 min    Activity Tolerance  Patient tolerated treatment well    Behavior During Therapy  [facility] for tasks assessed/performed       Past Medical History:  Diagnosis Date  . Arthritis   . CAD S/P percutaneous coronary angioplasty    a. s/p NSTEMI 7/17:  EF 55%. pLAD 40%, mLAD 50%, dLCx 20%, OM2 100%,>> PCI: 2.5 x 14 mm Resolute DES to OM2   . GERD (gastroesophageal reflux disease)   . History of non-ST elevation myocardial infarction (NSTEMI)    Noted to have occluded OM 2 treated with DES stent  . Hyperlipidemia 09/17/2015  . Hypertension   . Hypertensive heart disease 09/17/2015  . Myocardial infarction (HCC)    11/2015  . Pneumonia    1998  . PONV (postoperative nausea and vomiting)    After Cath    Past Surgical History:  Procedure Laterality Date  . BACK SURGERY     x2 2003 and 2005  . CARDIAC CATHETERIZATION N/A 09/17/2015   Procedure: Left Heart Cath and Coronary Angiography;  Surgeon: [name], MD;  [facility];  Service: Cardiovascular: EF 55%. pLAD 40%, mLAD 50%, dLCx 20%, OM2 100%,>> PCI: 2.5 x 14 mm Resolute DES to OM2   . CARDIAC CATHETERIZATION N/A 09/17/2015   Procedure: Coronary Stent Intervention;  Surgeon: [name], MD;  [facility];  Service: Cardiovascular:  PCI: 2.5 x 14 mm Resolute DES to OM2   . KNEE ARTHROSCOPY     Left 1994  . ROTATOR CUFF REPAIR  Right   . TOTAL KNEE ARTHROPLASTY Left 02/12/2017   Procedure: LEFT TOTAL KNEE ARTHROPLASTY;  Surgeon: [name], Jeffrey, MD;  Location: [facility];  Service: Orthopedics;  Laterality: Left;  120 mins  . TRANSTHORACIC ECHOCARDIOGRAM  09/17/2015   Normal LV size and thickness. EF 50-55%. No regional wall motion abnormality. GR 1 DD.    There were no vitals filed for this visit.  Subjective Assessment - 03/25/17 0736    Subjective  Patient arrived with c/o ongoing pain and stiffness in knee today    Pertinent History  Chronic right knee pain.  MI.  Back surgery. Right RTC repair.    How long can you walk comfortably?  Short distances around home with straight cane.    Patient Stated Goals  Get around without knee pain.    Currently in Pain?  Yes    Pain Score  4     Pain Location  Knee    Pain Orientation  Left    Pain Descriptors / Indicators  Discomfort    Pain Type  Surgical pain    Pain Onset  More than a month ago    Pain Frequency  Intermittent    Aggravating Factors   ROM in knee  Pain Relieving Factors  at rest         [facility] PT Assessment - 03/25/17 0001      AROM   AROM Assessment Site  Knee    Right/Left Knee  Left    Left Knee Extension  -13    Left Knee Flexion  81      PROM   PROM Assessment Site  Knee    Right/Left Knee  Left    Left Knee Extension  -8    Left Knee Flexion  92                  [facility] Adult PT Treatment/Exercise - 03/25/17 0001      Knee/Hip Exercises: Aerobic   Nustep  10 min L4 adjusted for ROM      Knee/Hip Exercises: Machines for Strengthening   Cybex Knee Extension  20# 3x10 with stretch and eccentric focus    Cybex Knee Flexion  30# 3x10 with eccentric focus      Knee/Hip Exercises: Supine   Quad Sets  Strengthening;Left;Limitations    Quad Sets Limitations  VMS to VMO/quad with SAQ 10/10 x5min for strength/muscle activation      Programme researcher, broadcasting/film/video Location  left knee    Electrical  Stimulation Action  IFC    Electrical Stimulation Parameters  1-10hz  x1min    Electrical Stimulation Goals  Pain      Vasopneumatic   Number Minutes Vasopneumatic   15 minutes    Vasopnuematic Location   Knee    Vasopneumatic Pressure  Medium      Manual Therapy   Manual Therapy  Passive ROM;Myofascial release;Soft tissue mobilization    Soft tissue mobilization  scar massage and patella mobs all directions     Myofascial Release  .    Passive ROM  manual IASTM supine to lateral quad followed by manual stretching for knee flexion then prone IASTM to medial lateral HS tendon followed by ext stretching in supine                  PT Long Term Goals - 03/20/17 0813      PT LONG TERM GOAL #1   Title  Independent with a HEP.    Time  4    Period  Weeks    Status  Achieved      PT LONG TERM GOAL #2   Title  Active left knee flexion to 115 degrees+ so the patient can perform functional tasks and do so with pain not > 2-3/10.    Time  4    Period  Weeks    Status  On-going AROM L knee flexion 87 deg 03/20/2017      PT LONG TERM GOAL #3   Title  Increase left knee strength to a solid 4+/5 to provide good stability for accomplishment of functional activities.    Time  4    Period  Weeks    Status  On-going      PT LONG TERM GOAL #4   Title  Full left active knee extension in order to normalize gait.    Time  4    Period  Weeks    Status  On-going 13 deg from full extension of L knee 03/20/2017      PT LONG TERM GOAL #5   Title  Perform a reciprocating stair gait with one railing with pain not > 2-3/10.    Time  4  Period  Weeks    Status  On-going            Plan - 03/25/17 1610    Clinical Impression Statement  Patient tolerated treatement well today. Patient has ongoing stiffness in knee yet has been doing self stretching daily as instructed. Patient doing well with progress for left knee flexion and ext today. Today focused on stretching folloewed by some  strengthening. Today paient has good quad response to VMS. Patient has MD appt tomorow for F/U to discuss further POC for therapy. Patient has reported concern of his limitations and returning to work in 4 weeks. Current goals ongoing due to ROM and strength deficts.     Rehab Potential  Good    PT Frequency  3x / week    PT Duration  4 weeks    PT Treatment/Interventions  ADLs/Self Care Home Management;Electrical Stimulation;Cryotherapy;Therapeutic exercise;Therapeutic activities;Stair training;Gait training;Patient/family education;Passive range of motion;Manual techniques;Vasopneumatic Device    PT Next Visit Plan  cont with POC ROM focus and MD note tomorrow (Dr. Shelle Iron on friday)    Consulted and Agree with Plan of Care  Patient       Patient will benefit from skilled therapeutic intervention in order to improve the following deficits and impairments:  Abnormal gait, Decreased activity tolerance, Decreased range of motion, Increased edema, Pain, Decreased strength  Visit Diagnosis: Localized edema  Chronic pain of left knee  Stiffness of left knee, not elsewhere classified     Problem List Patient Active Problem List   Diagnosis Date Noted  . Primary osteoarthritis of left knee 02/12/2017  . 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 Coronary artery disease involving native coronary artery of native heart with unstable angina pectoris (HCC) 09/25/2015  . Status post coronary artery stent placement   . Hypertensive heart disease 09/17/2015  . Hyperlipidemia with target low density lipoprotein (LDL) cholesterol less than 70 mg/dL 96/06/5407  . NSTEMI (non-ST elevated myocardial infarction) (HCC) 09/16/2015    DUNFORD, CHRISTINA P, PTA 03/25/2017, 8:47 AM  [facility] [address] [place], [place], [address] Phone: [phone]   Fax:  [phone]  Name: [name] MRN: [id number] Date of Birth: [birth year]

## 2017-03-26 ENCOUNTER — Ambulatory Visit: Payer: BLUE CROSS/BLUE SHIELD | Admitting: Physical Therapy

## 2017-03-26 DIAGNOSIS — R600 Localized edema: Secondary | ICD-10-CM

## 2017-03-26 DIAGNOSIS — M25562 Pain in left knee: Secondary | ICD-10-CM

## 2017-03-26 DIAGNOSIS — G8929 Other chronic pain: Secondary | ICD-10-CM

## 2017-03-26 DIAGNOSIS — M25662 Stiffness of left knee, not elsewhere classified: Secondary | ICD-10-CM

## 2017-03-26 NOTE — Therapy (Signed)
[facility] [address] [place], [place], [address] Phone: [phone]   Fax:  [phone]  Physical Therapy Treatment  Patient Details  Name: [name] MRN: [id number] Date of Birth: Aug 17, 1965 Referring Provider: [name] MD   Encounter Date: 03/26/2017  PT End of Session - 03/26/17 0731    Visit Number  9    Number of Visits  12    Date for PT Re-Evaluation  04/06/17    Authorization Type  FOTO at leasr every 5th visit.    PT Start Time  0730    PT Stop Time  0835    PT Time Calculation (min)  65 min    Activity Tolerance  Patient tolerated treatment well    Behavior During Therapy  WFL for tasks assessed/performed       Past Medical History:  Diagnosis Date  . Arthritis   . CAD S/P percutaneous coronary angioplasty    a. s/p NSTEMI 7/17:  EF 55%. pLAD 40%, mLAD 50%, dLCx 20%, OM2 100%,>> PCI: 2.5 x 14 mm Resolute DES to OM2   . GERD (gastroesophageal reflux disease)   . History of non-ST elevation myocardial infarction (NSTEMI)    Noted to have occluded OM 2 treated with DES stent  . Hyperlipidemia 09/17/2015  . Hypertension   . Hypertensive heart disease 09/17/2015  . Myocardial infarction (HCC)    11/2015  . Pneumonia    1998  . PONV (postoperative nausea and vomiting)    After Cath    Past Surgical History:  Procedure Laterality Date  . BACK SURGERY     x2 2003 and 2005  . CARDIAC CATHETERIZATION N/A 09/17/2015   Procedure: Left Heart Cath and Coronary Angiography;  Surgeon: [name], MD;  Location: [facility] CV LAB;  Service: Cardiovascular: EF 55%. pLAD 40%, mLAD 50%, dLCx 20%, OM2 100%,>> PCI: 2.5 x 14 mm Resolute DES to OM2   . CARDIAC CATHETERIZATION N/A 09/17/2015   Procedure: Coronary Stent Intervention;  Surgeon: [name], MD;  Location[facility] CV LAB;  Service: Cardiovascular:  PCI: 2.5 x 14 mm Resolute DES to OM2   . KNEE ARTHROSCOPY     Left 1994  . ROTATOR CUFF REPAIR  Right   . TOTAL KNEE ARTHROPLASTY Left 02/12/2017   Procedure: LEFT TOTAL KNEE ARTHROPLASTY;  Surgeon: [name], MD;  Location: [facility];  Service: Orthopedics;  Laterality: Left;  120 mins  . TRANSTHORACIC ECHOCARDIOGRAM  09/17/2015   Normal LV size and thickness. EF 50-55%. No regional wall motion abnormality. GR 1 DD.    There were no vitals filed for this visit.  Subjective Assessment - 03/26/17 0732    Subjective  Patient reported "feeling stiff this morning." Patient stated no pain at rest, just pain during activity. Patient to see surgeon, Dr. Shelle Iron tomorrow 03/27/17.    Pertinent History  Chronic right knee pain.  MI.  Back surgery. Right RTC repair.    How long can you walk comfortably?  Short distances around home with straight cane.    Patient Stated Goals  Get around without knee pain.    Currently in Pain?  -- no pain at rest, just stiffness    Pain Score  --         [facility] PT Assessment - 03/26/17 0001      AROM   AROM Assessment Site  Knee    Right/Left Knee  Left    Left Knee Extension  -17  Left Knee Flexion  84      PROM   PROM Assessment Site  Knee    Right/Left Knee  Left    Left Knee Extension  -10    Left Knee Flexion  94      Strength   Overall Strength Comments  Left knee flexion and extension strength 4+/5                  [facility] Adult PT Treatment/Exercise - 03/26/17 0734      Knee/Hip Exercises: Stretches   Passive Hamstring Stretch  Left;3 reps;30 seconds standing with foot on step      Knee/Hip Exercises: Aerobic   Nustep  Level 6 x10 total, x5 at seat 6 and x5 at seat 5 to improve left knee flexion      Knee/Hip Exercises: Machines for Strengthening   Cybex Knee Extension  20# 2x10    Cybex Knee Flexion  30# 2x10      Electrical Stimulation   Electrical Stimulation Location  left knee    Electrical Stimulation Action  IFC    Electrical Stimulation Parameters  1-10 hz x15    Electrical Stimulation Goals  Pain       Vasopneumatic   Number Minutes Vasopneumatic   15 minutes    Vasopnuematic Location   Knee    Vasopneumatic Pressure  Medium      Manual Therapy   Manual Therapy  Joint mobilization;Soft tissue mobilization;Passive ROM    Joint Mobilization  inferior and superior patella mobs    Soft tissue mobilization  IASTM to L quad tendon in sitting with knee flexed and slight overpressure    Passive ROM  Passive L knee flexion and extension ROM sitting and supine, PNF Contract relax to improve knee flexion                  PT Long Term Goals - 03/26/17 0843      PT LONG TERM GOAL #1   Title  Independent with a HEP.    Time  4    Period  Weeks    Status  Achieved      PT LONG TERM GOAL #2   Title  Active left knee flexion to 115 degrees+ so the patient can perform functional tasks and do so with pain not > 2-3/10.    Time  4    Status  On-going      PT LONG TERM GOAL #3   Title  Increase left knee strength to a solid 4+/5 to provide good stability for accomplishment of functional activities.    Time  4    Status  Achieved      PT LONG TERM GOAL #4   Title  Full left active knee extension in order to normalize gait.    Time  4    Period  Weeks    Status  On-going      PT LONG TERM GOAL #5   Title  Perform a reciprocating stair gait with one railing with pain not > 2-3/10.    Time  4    Period  Weeks    Status  On-going            Plan - 03/26/17 1610    Clinical Impression Statement  Patient tolerated treatment well today. Patient demonstrated 4+/5 left knee flexion and extension strength. Patient continues to have limited left knee active and passive flexion and extension ROM which affects his gait pattern (decreased  left stance time, increased left knee flexion during stance) and overall function; see assessment. Patient reported compliance with HEP and use of modalities to decrease pain and edema.    Rehab Potential  Good    PT Frequency  3x / week    PT  Duration  4 weeks    PT Treatment/Interventions  ADLs/Self Care Home Management;Electrical Stimulation;Cryotherapy;Therapeutic exercise;Therapeutic activities;Stair training;Gait training;Patient/family education;Passive range of motion;Manual techniques;Vasopneumatic Device    PT Next Visit Plan  Continue POC to address active and passive ROM to improve functional activities and normalize gait pattern.    Consulted and Agree with Plan of Care  Patient       Patient will benefit from skilled therapeutic intervention in order to improve the following deficits and impairments:  Abnormal gait, Decreased activity tolerance, Decreased range of motion, Increased edema, Pain, Decreased strength  Visit Diagnosis: Localized edema  Chronic pain of left knee  Stiffness of left knee, not elsewhere classified     Problem List Patient Active Problem List   Diagnosis Date Noted  . Primary osteoarthritis of left knee 02/12/2017  . 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 Coronary artery disease involving native coronary artery of native heart with unstable angina pectoris (HCC) 09/25/2015  . Status post coronary artery stent placement   . Hypertensive heart disease 09/17/2015  . Hyperlipidemia with target low density lipoprotein (LDL) cholesterol less than 70 mg/dL 57/84/696207/12/2015  . NSTEMI (non-ST elevated myocardial infarction) (HCC) 09/16/2015    Guss BundeKrystle Makyra Corprew, PT, DPT 03/26/2017, 9:06 AM  [facility] [address] [place], [place], [address] Phone: [phone]   Fax:  [phone]  Name: [name] MRN: [id number] Date of Birth: [birth year]

## 2017-03-27 ENCOUNTER — Encounter: Payer: BLUE CROSS/BLUE SHIELD | Admitting: Physical Therapy

## 2017-03-27 DIAGNOSIS — M5136 Other intervertebral disc degeneration, lumbar region: Secondary | ICD-10-CM | POA: Insufficient documentation

## 2017-03-27 DIAGNOSIS — M1710 Unilateral primary osteoarthritis, unspecified knee: Secondary | ICD-10-CM | POA: Insufficient documentation

## 2017-03-27 DIAGNOSIS — M179 Osteoarthritis of knee, unspecified: Secondary | ICD-10-CM | POA: Insufficient documentation

## 2017-03-30 ENCOUNTER — Ambulatory Visit: Payer: BLUE CROSS/BLUE SHIELD | Admitting: Physical Therapy

## 2017-03-30 DIAGNOSIS — G8929 Other chronic pain: Secondary | ICD-10-CM

## 2017-03-30 DIAGNOSIS — M25662 Stiffness of left knee, not elsewhere classified: Secondary | ICD-10-CM

## 2017-03-30 DIAGNOSIS — R600 Localized edema: Secondary | ICD-10-CM | POA: Diagnosis not present

## 2017-03-30 DIAGNOSIS — M25562 Pain in left knee: Secondary | ICD-10-CM

## 2017-03-30 NOTE — Therapy (Addendum)
[facility] [address] [place], [place], [address] Phone: [phone]   Fax:  [phone]  Physical Therapy Treatment  Patient Details  Name: Victor FennelKendall M Braddy MRN: [id number] Date of Birth: 1966/03/10 Referring Provider: Jene EveryJeffrey Beane MD   Encounter Date: 03/30/2017  PT End of Session - 03/30/17 0736    Visit Number  10    Number of Visits  12    Date for PT Re-Evaluation  04/06/17    Authorization Type  FOTO at leasr every 5th visit.    PT Start Time  0735    PT Stop Time  [phone]    PT Time Calculation (min)  58 min    Activity Tolerance  Patient tolerated treatment well    Behavior During Therapy  [facility] assessed/per[facility]       Past Medical History:  Diagnosis Date  . Arthritis   . CAD S/P percutaneous coronary angioplasty    a. s/p NSTEMI 7/17:  EF 55%. pLAD 40%, mLAD 50%, dLCx 20%, OM2 100%,>> PCI: 2.5 x 14 mm Resolute DES to OM2   . GERD (gastroesophageal reflux disease)   . History of non-ST elevation myocardial infarction (NSTEMI)    Noted to have occluded OM 2 treated with DES stent  . Hyperlipidemia 09/17/2015  . Hypertension   . Hypertensive heart disease 09/17/2015  . Myocardial infarction (HCC)    11/2015  . Pneumonia    1998  . PONV (postoperative nausea and vomiting)    After Cath    Past Surgical History:  Procedure Laterality Date  . BACK SURGERY     x2 2003 and 2005  . CARDIAC CATHETERIZATION N/A 09/17/2015   Procedure: Left Heart Cath and Coronary Angiography;  Surgeon: [name], MD;  [facility];  Service: Cardiovascular: EF 55%. pLAD 40%, mLAD 50%, dLCx 20%, OM2 100%,>> PCI: 2.5 x 14 mm Resolute DES to OM2   . CARDIAC CATHETERIZATION N/A 09/17/2015   Procedure: Coronary Stent Intervention;  Surgeon: [name], MD;  [facility];  Service: Cardiovascular:  PCI: 2.5 x 14 mm Resolute DES to OM2   . KNEE ARTHROSCOPY     Left 1994  . ROTATOR CUFF REPAIR  Right   . TOTAL KNEE ARTHROPLASTY Left 02/12/2017   Procedure: LEFT TOTAL KNEE ARTHROPLASTY;  Surgeon: [name], Jeffrey, MD;  Location: [facility];  Service: Orthopedics;  Laterality: Left;  120 mins  . TRANSTHORACIC ECHOCARDIOGRAM  09/17/2015   Normal LV size and thickness. EF 50-55%. No regional wall motion abnormality. GR 1 DD.    There were no vitals filed for this visit.  Subjective Assessment - 03/30/17 0737    Subjective  Patient reported his follow up with his doctor went well. Doctor is pleased with progress; doctor was able to achieve 96 degrees of PROM flexion at visit. Patient continues to have morning stiffness and discomfort at superior scar, above the patella but otherwise states he feels he made great improvement.    Pertinent History  Chronic right knee pain.  MI.  Back surgery. Right RTC repair.    Currently in Pain?  No/denies stiffness    Pain Location  Knee    Pain Orientation  Left                      [facility] Adult PT Treatment/Exercise - 03/30/17 0001      Knee/Hip Exercises: Stretches   Passive Hamstring Stretch  Left;3 reps;30  seconds on step      Knee/Hip Exercises: Aerobic   Nustep  Level 6, x10 seat 5      Knee/Hip Exercises: Standing   Knee Flexion  AROM;Left;2 sets;10 reps 5 second hold, on step    Terminal Knee Extension  AROM;2 sets;10 reps;Theraband    Theraband Level (Terminal Knee Extension)  Level 2 (Red)      Modalities   Modalities  Cryotherapy      Cryotherapy   Number Minutes Cryotherapy  15 Minutes    Cryotherapy Location  Knee    Type of Cryotherapy  Ice pack      Electrical Stimulation   Electrical Stimulation Location  left knee    Electrical Stimulation Action  IFC    Electrical Stimulation Parameters  1-10hz  x15    Electrical Stimulation Goals  Pain      Manual Therapy   Manual Therapy  Joint mobilization;Soft tissue mobilization;Passive ROM    Joint Mobilization  inferior and superior patella mobs    Soft tissue  mobilization  IASTM to L hamstring tendons in Prone with 4# weight (7 min)    Passive ROM  Passive L knee flexion and extension ROM sitting and supine                  PT Long Term Goals - 03/26/17 0843      PT LONG TERM GOAL #1   Title  Independent with a HEP.    Time  4    Period  Weeks    Status  Achieved      PT LONG TERM GOAL #2   Title  Active left knee flexion to 115 degrees+ so the patient can perform functional tasks and do so with pain not > 2-3/10.    Time  4    Status  On-going      PT LONG TERM GOAL #3   Title  Increase left knee strength to a solid 4+/5 to provide good stability for accomplishment of functional activities.    Time  4    Status  Achieved      PT LONG TERM GOAL #4   Title  Full left active knee extension in order to normalize gait.    Time  4    Period  Weeks    Status  On-going      PT LONG TERM GOAL #5   Title  Perform a reciprocating stair gait with one railing with pain not > 2-3/10.    Time  4    Period  Weeks    Status  On-going            Plan - 03/30/17 1610    Clinical Impression Statement  Patient was able to complete treatment well despite "soreness" while performing AROM. Patient educated to perform scar massage at superior aspect of scar to improve mobility and flexibilty. Patient in agreement. FOTO next visit.    Rehab Potential  Good    PT Frequency  3x / week    PT Duration  4 weeks    PT Treatment/Interventions  ADLs/Self Care Home Management;Electrical Stimulation;Cryotherapy;Therapeutic exercise;Therapeutic activities;Stair training;Gait training;Patient/family education;Passive range of motion;Manual techniques;Vasopneumatic Device    PT Next Visit Plan  Continue POC to address active and passive ROM to improve functional activities and normalize gait pattern.    Consulted and Agree with Plan of Care  Patient       Patient will benefit from skilled therapeutic intervention in order to improve  the  following deficits and impairments:  Abnormal gait, Decreased activity tolerance, Decreased range of motion, Increased edema, Pain, Decreased strength  Visit Diagnosis: Localized edema  Chronic pain of left knee  Stiffness of left knee, not elsewhere classified     Problem List Patient Active Problem List   Diagnosis Date Noted  . Primary osteoarthritis of left knee 02/12/2017  . 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 Coronary artery disease involving native coronary artery of native heart with unstable angina pectoris (HCC) 09/25/2015  . Status post coronary artery stent placement   . Hypertensive heart disease 09/17/2015  . Hyperlipidemia with target low density lipoprotein (LDL) cholesterol less than 70 mg/dL 95/62/1308  . NSTEMI (non-ST elevated myocardial infarction) (HCC) 09/16/2015    Guss Bunde, PT, DPT 03/30/2017, 10:09 AM  [facility] [address] [place], [place], [address] Phone: [phone]   Fax:  [phone]  Name: MONTRELL CESSNA MRN: [id number] Date of Birth: 01-27-1966

## 2017-04-01 ENCOUNTER — Ambulatory Visit: Payer: BLUE CROSS/BLUE SHIELD | Admitting: Physical Therapy

## 2017-04-01 ENCOUNTER — Encounter: Payer: Self-pay | Admitting: Physical Therapy

## 2017-04-01 DIAGNOSIS — G8929 Other chronic pain: Secondary | ICD-10-CM

## 2017-04-01 DIAGNOSIS — R600 Localized edema: Secondary | ICD-10-CM | POA: Diagnosis not present

## 2017-04-01 DIAGNOSIS — M25662 Stiffness of left knee, not elsewhere classified: Secondary | ICD-10-CM

## 2017-04-01 DIAGNOSIS — M25562 Pain in left knee: Secondary | ICD-10-CM

## 2017-04-01 NOTE — Addendum Note (Signed)
Addended by: Guss BundeMANGAWANG, Elzina Devera on: 04/01/2017 12:01 PM   Modules accepted: Orders

## 2017-04-01 NOTE — Therapy (Signed)
[facility] [address] [place], [place], [address] Phone: [phone]   Fax:  [phone]  Physical Therapy Treatment  Patient Details  Name: [name] MRN: [id number] Date of Birth: [birth year] Referring Provider: [name] MD   Encounter Date: 04/01/2017  PT End of Session - 04/01/17 0747    Visit Number  11    Number of Visits  12    Date for PT Re-Evaluation  04/06/17    Authorization Type  FOTO at leasr every 5th visit.    PT Start Time  0732    PT Stop Time  0823    PT Time Calculation (min)  51 min    Activity Tolerance  Patient tolerated treatment well    Behavior During Therapy  [facility] for tasks assessed/performed       Past Medical History:  Diagnosis Date  . Arthritis   . CAD S/P percutaneous coronary angioplasty    a. s/p NSTEMI 7/17:  EF 55%. pLAD 40%, mLAD 50%, dLCx 20%, OM2 100%,>> PCI: 2.5 x 14 mm Resolute DES to OM2   . GERD (gastroesophageal reflux disease)   . History of non-ST elevation myocardial infarction (NSTEMI)    Noted to have occluded OM 2 treated with DES stent  . Hyperlipidemia 09/17/2015  . Hypertension   . Hypertensive heart disease 09/17/2015  . Myocardial infarction (HCC)    11/2015  . Pneumonia    1998  . PONV (postoperative nausea and vomiting)    After Cath    Past Surgical History:  Procedure Laterality Date  . BACK SURGERY     x2 2003 and 2005  . CARDIAC CATHETERIZATION N/A 09/17/2015   Procedure: Left Heart Cath and Coronary Angiography;  Surgeon: [name], MD;  Location: [facility] INVASIVE CV LAB;  Service: Cardiovascular: EF 55%. pLAD 40%, mLAD 50%, dLCx 20%, OM2 100%,>> PCI: 2.5 x 14 mm Resolute DES to OM2   . CARDIAC CATHETERIZATION N/A 09/17/2015   Procedure: Coronary Stent Intervention;  Surgeon: [name], MD;  Location: [facility] INVASIVE CV LAB;  Service: Cardiovascular:  PCI: 2.5 x 14 mm Resolute DES to OM2   . KNEE ARTHROSCOPY     Left 1994  . ROTATOR CUFF REPAIR  Right   . TOTAL KNEE ARTHROPLASTY Left 02/12/2017   Procedure: LEFT TOTAL KNEE ARTHROPLASTY;  Surgeon: [name], MD;  Location: [facility];  Service: Orthopedics;  Laterality: Left;  120 mins  . TRANSTHORACIC ECHOCARDIOGRAM  09/17/2015   Normal LV size and thickness. EF 50-55%. No regional wall motion abnormality. GR 1 DD.    There were no vitals filed for this visit.  Subjective Assessment - 04/01/17 0734    Subjective  Patient reported swelling in knee this morning.    Pertinent History  Chronic right knee pain.  MI.  Back surgery. Right RTC repair.    How long can you walk comfortably?  Short distances around home with straight cane.    Patient Stated Goals  Get around without knee pain.    Currently in Pain?  No/denies         [facility] PT Assessment - 04/01/17 0001      Assessment   Medical Diagnosis  Left total knee replacement.    Onset Date/Surgical Date  02/12/17    Next MD Visit  04/2017      Restrictions   Weight Bearing Restrictions  No  [facility] Adult PT Treatment/Exercise - 04/01/17 0001      Knee/Hip Exercises: Aerobic   Nustep  Level 6, x15 seat 5      Knee/Hip Exercises: Machines for Strengthening   Cybex Knee Extension  20# 3x10 reps      Knee/Hip Exercises: Standing   Forward Lunges  Left;3 sets;10 reps;3 seconds    Terminal Knee Extension  AROM;2 sets;10 reps;Limitations    Terminal Knee Extension Limitations  Pink XTS with prolonged holds    Functional Squat  20 reps      Knee/Hip Exercises: Prone   Prone Knee Hang  4 minutes;Weights 4#      Modalities   Modalities  Electrical Stimulation;Vasopneumatic      Programme researcher, broadcasting/film/video Location  L knee    Electrical Stimulation Action  IFC    Electrical Stimulation Parameters  1-10 hz x15 min    Electrical Stimulation Goals  Pain;Edema      Vasopneumatic   Number Minutes Vasopneumatic   15 minutes    Vasopnuematic Location   Knee    Vasopneumatic  Pressure  Medium    Vasopneumatic Temperature   34      Manual Therapy   Manual Therapy  Soft tissue mobilization    Soft tissue mobilization  STW to L HS, ITB, superior incision to reduce muscle tightness                  PT Long Term Goals - 03/26/17 8295      PT LONG TERM GOAL #1   Title  Independent with a HEP.    Time  4    Period  Weeks    Status  Achieved      PT LONG TERM GOAL #2   Title  Active left knee flexion to 115 degrees+ so the patient can perform functional tasks and do so with pain not > 2-3/10.    Time  4    Status  On-going      PT LONG TERM GOAL #3   Title  Increase left knee strength to a solid 4+/5 to provide good stability for accomplishment of functional activities.    Time  4    Status  Achieved      PT LONG TERM GOAL #4   Title  Full left active knee extension in order to normalize gait.    Time  4    Period  Weeks    Status  On-going      PT LONG TERM GOAL #5   Title  Perform a reciprocating stair gait with one railing with pain not > 2-3/10.    Time  4    Period  Weeks    Status  On-going            Plan - 04/01/17 0819    Clinical Impression Statement  Patient tolerated today's treatment well despite the increased edema reported. Patient able to complete exercises directed without complaint of pain. Patient still ambulating with L knee flexion and antalgic gait. Patient demonstrated continued tightness in the HS and ITB region as well as incision region. Normal modalities response noted following removal of the modalities.    Rehab Potential  Good    PT Frequency  3x / week    PT Duration  4 weeks    PT Treatment/Interventions  ADLs/Self Care Home Management;Electrical Stimulation;Cryotherapy;Therapeutic exercise;Therapeutic activities;Stair training;Gait training;Patient/family education;Passive range of motion;Manual techniques;Vasopneumatic Device    PT Next Visit Plan  Continue POC to address active and passive ROM to  improve functional activities and normalize gait pattern.    Consulted and Agree with Plan of Care  Patient       Patient will benefit from skilled therapeutic intervention in order to improve the following deficits and impairments:  Abnormal gait, Decreased activity tolerance, Decreased range of motion, Increased edema, Pain, Decreased strength  Visit Diagnosis: Localized edema  Chronic pain of left knee  Stiffness of left knee, not elsewhere classified     Problem List Patient Active Problem List   Diagnosis Date Noted  . Primary osteoarthritis of left knee 02/12/2017  . 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 Coronary artery disease involving native coronary artery of native heart with unstable angina pectoris (HCC) 09/25/2015  . Status post coronary artery stent placement   . Hypertensive heart disease 09/17/2015  . Hyperlipidemia with target low density lipoprotein (LDL) cholesterol less than 70 mg/dL 21/30/865707/12/2015  . NSTEMI (non-ST elevated myocardial infarction) [facility](HCC) 09/16/2015    Marvell FullerKelsey P Kennon, PTA 04/01/2017, 9:05 AM  [facility] [address] [place], [place], [address] Phone: [phone]   Fax:  [phone]  Name: Marcello FennelKendall M Gallion MRN: [id number] Date of Birth: 1965-08-19

## 2017-04-03 ENCOUNTER — Ambulatory Visit: Payer: BLUE CROSS/BLUE SHIELD | Admitting: Physical Therapy

## 2017-04-03 ENCOUNTER — Encounter: Payer: Self-pay | Admitting: Physical Therapy

## 2017-04-03 DIAGNOSIS — M25662 Stiffness of left knee, not elsewhere classified: Secondary | ICD-10-CM

## 2017-04-03 DIAGNOSIS — R600 Localized edema: Secondary | ICD-10-CM | POA: Diagnosis not present

## 2017-04-03 DIAGNOSIS — G8929 Other chronic pain: Secondary | ICD-10-CM

## 2017-04-03 DIAGNOSIS — M25562 Pain in left knee: Secondary | ICD-10-CM

## 2017-04-03 NOTE — Therapy (Signed)
[facility] [address] [place], [place], [address] Phone: [phone]   Fax:  [phone]  Physical Therapy Treatment  Patient Details  Name: [name] MRN: [id number] Date of Birth: 1966-02-22 Referring Provider: [name] MD   Encounter Date: 04/03/2017  PT End of Session - 04/03/17 0745    Visit Number  12    Number of Visits  18    Date for PT Re-Evaluation  05/04/17    Authorization Type  FOTO at leasr every 5th visit.    PT Start Time  0729    PT Stop Time  0825    PT Time Calculation (min)  56 min    Activity Tolerance  Patient tolerated treatment well    Behavior During Therapy  [facility] for tasks assessed/performed       Past Medical History:  Diagnosis Date  . Arthritis   . CAD S/P percutaneous coronary angioplasty    a. s/p NSTEMI 7/17:  EF 55%. pLAD 40%, mLAD 50%, dLCx 20%, OM2 100%,>> PCI: 2.5 x 14 mm Resolute DES to OM2   . GERD (gastroesophageal reflux disease)   . History of non-ST elevation myocardial infarction (NSTEMI)    Noted to have occluded OM 2 treated with DES stent  . Hyperlipidemia 09/17/2015  . Hypertension   . Hypertensive heart disease 09/17/2015  . Myocardial infarction (HCC)    11/2015  . Pneumonia    1998  . PONV (postoperative nausea and vomiting)    After Cath    Past Surgical History:  Procedure Laterality Date  . BACK SURGERY     x2 2003 and 2005  . CARDIAC CATHETERIZATION N/A 09/17/2015   Procedure: Left Heart Cath and Coronary Angiography;  Surgeon: [name], MD;  Location: [facility] INVASIVE CV LAB;  Service: Cardiovascular: EF 55%. pLAD 40%, mLAD 50%, dLCx 20%, OM2 100%,>> PCI: 2.5 x 14 mm Resolute DES to OM2   . CARDIAC CATHETERIZATION N/A 09/17/2015   Procedure: Coronary Stent Intervention;  Surgeon: [name], MD;  Location: [facility] INVASIVE CV LAB;  Service: Cardiovascular:  PCI: 2.5 x 14 mm Resolute DES to OM2   . KNEE ARTHROSCOPY     Left 1994  . ROTATOR CUFF REPAIR  Right   . TOTAL KNEE ARTHROPLASTY Left 02/12/2017   Procedure: LEFT TOTAL KNEE ARTHROPLASTY;  Surgeon: [name], MD;  Location: [facility];  Service: Orthopedics;  Laterality: Left;  120 mins  . TRANSTHORACIC ECHOCARDIOGRAM  09/17/2015   Normal LV size and thickness. EF 50-55%. No regional wall motion abnormality. GR 1 DD.    There were no vitals filed for this visit.  Subjective Assessment - 04/03/17 0744    Subjective  Reports his knee feels fine although he is having more pain around patella.    Pertinent History  Chronic right knee pain.  MI.  Back surgery. Right RTC repair.    How long can you walk comfortably?  Short distances around home with straight cane.    Patient Stated Goals  Get around without knee pain.    Currently in Pain?  Yes    Pain Score  4     Pain Location  Knee patella    Pain Orientation  Left    Pain Descriptors / Indicators  Discomfort    Pain Type  Surgical pain    Pain Onset  More than a month ago         [facility] PT Assessment - 04/03/17 0001  Assessment   Medical Diagnosis  Left total knee replacement.    Onset Date/Surgical Date  02/12/17    Next MD Visit  04/2017      Restrictions   Weight Bearing Restrictions  No      ROM / Strength   AROM / PROM / Strength  AROM      AROM   Overall AROM   Deficits    AROM Assessment Site  Knee    Right/Left Knee  Left    Left Knee Extension  -10    Left Knee Flexion  92                  [facility] Adult PT Treatment/Exercise - 04/03/17 0001      Knee/Hip Exercises: Stretches   Lobbyist  Left;3 reps;20 seconds in prone with folded towel under thigh      Knee/Hip Exercises: Aerobic   Nustep  Level 6, x15 seat 5      Knee/Hip Exercises: Machines for Strengthening   Cybex Knee Extension  20# 3x10 reps    Cybex Leg Press  2.5 pl, seat 5 x30 reps      Knee/Hip Exercises: Standing   Forward Lunges  Left;20 reps;2 seconds 8" step    Terminal Knee Extension  Strengthening;Left;2 sets;10  reps;Limitations    Terminal Knee Extension Limitations  Pink XTS with prolonged holds      Knee/Hip Exercises: Prone   Prone Knee Hang  3 minutes 4# overpressure with towel under distal thigh      Modalities   Modalities  Programmer, applications Location  L knee    Electrical Stimulation Action  IFC    Electrical Stimulation Parameters  1-10 hz x15 min    Electrical Stimulation Goals  Pain;Edema      Vasopneumatic   Number Minutes Vasopneumatic   15 minutes    Vasopnuematic Location   Knee    Vasopneumatic Pressure  Medium    Vasopneumatic Temperature   34                  PT Long Term Goals - 03/26/17 1610      PT LONG TERM GOAL #1   Title  Independent with a HEP.    Time  4    Period  Weeks    Status  Achieved      PT LONG TERM GOAL #2   Title  Active left knee flexion to 115 degrees+ so the patient can perform functional tasks and do so with pain not > 2-3/10.    Time  4    Status  On-going      PT LONG TERM GOAL #3   Title  Increase left knee strength to a solid 4+/5 to provide good stability for accomplishment of functional activities.    Time  4    Status  Achieved      PT LONG TERM GOAL #4   Title  Full left active knee extension in order to normalize gait.    Time  4    Period  Weeks    Status  On-going      PT LONG TERM GOAL #5   Title  Perform a reciprocating stair gait with one railing with pain not > 2-3/10.    Time  4    Period  Weeks    Status  On-going  Plan - 04/03/17 0815    Clinical Impression Statement  Patient tolerated today's treatment well although he reports having greatest pain surrounding the L patella and just inferior to the patella. Quad strengthening completed today along with prone quad stretch to assist with reduced pain and improved ROM. AROM of L knee improved to 10-92 deg today following exercises. Normal modalities response noted  following removal of the modalities. Patient encouraged to continue HEP and instructed on prone quad stretch for home.    Rehab Potential  Good    PT Frequency  3x / week    PT Duration  4 weeks    PT Treatment/Interventions  ADLs/Self Care Home Management;Electrical Stimulation;Cryotherapy;Therapeutic exercise;Therapeutic activities;Stair training;Gait training;Patient/family education;Passive range of motion;Manual techniques;Vasopneumatic Device    PT Next Visit Plan  Consider completing IASTW to L patellar tendon and distal quad to reduce tone and pain.    Consulted and Agree with Plan of Care  Patient       Patient will benefit from skilled therapeutic intervention in order to improve the following deficits and impairments:  Abnormal gait, Decreased activity tolerance, Decreased range of motion, Increased edema, Pain, Decreased strength  Visit Diagnosis: Localized edema  Chronic pain of left knee  Stiffness of left knee, not elsewhere classified     Problem List Patient Active Problem List   Diagnosis Date Noted  . Primary osteoarthritis of left knee 02/12/2017  . 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 Coronary artery disease involving native coronary artery of native heart with unstable angina pectoris (HCC) 09/25/2015  . Status post coronary artery stent placement   . Hypertensive heart disease 09/17/2015  . Hyperlipidemia with target low density lipoprotein (LDL) cholesterol less than 70 mg/dL 18/84/166007/12/2015  . NSTEMI (non-ST elevated myocardial infarction) (HCC) 09/16/2015    Marvell FullerKelsey P Kennon, PTA 04/03/2017, 8:31 AM  [facility] [address] [place], [place], [address] Phone: [phone]   Fax:  [phone]  Name: [name] MRN: [id number] Date of Birth: 1965-10-04

## 2017-04-06 ENCOUNTER — Encounter: Payer: Self-pay | Admitting: Physical Therapy

## 2017-04-06 ENCOUNTER — Ambulatory Visit: Payer: BLUE CROSS/BLUE SHIELD | Admitting: Physical Therapy

## 2017-04-06 DIAGNOSIS — G8929 Other chronic pain: Secondary | ICD-10-CM

## 2017-04-06 DIAGNOSIS — R600 Localized edema: Secondary | ICD-10-CM | POA: Diagnosis not present

## 2017-04-06 DIAGNOSIS — M25662 Stiffness of left knee, not elsewhere classified: Secondary | ICD-10-CM

## 2017-04-06 DIAGNOSIS — M25562 Pain in left knee: Secondary | ICD-10-CM

## 2017-04-06 NOTE — Therapy (Addendum)
[facility] [address] [place], [place], [address] Phone: [phone]   Fax:  [phone]  Physical Therapy Treatment  Patient Details  Name: Marcello FennelKendall M Hantz MRN: [id number] Date of Birth: Sep 04, 1965 Referring Provider: Jene EveryJeffrey Beane MD   Encounter Date: 04/06/2017  PT End of Session - 04/06/17 0740    Visit Number  13    Number of Visits  18    Date for PT Re-Evaluation  05/04/17    Authorization Type  FOTO at leasr every 5th visit.    PT Start Time  0730    PT Stop Time  0814    PT Time Calculation (min)  44 min    Activity Tolerance  Patient tolerated treatment well    Behavior During Therapy  [facility] for tasks assessed/performed       Past Medical History:  Diagnosis Date  . Arthritis   . CAD S/P percutaneous coronary angioplasty    a. s/p NSTEMI 7/17:  EF 55%. pLAD 40%, mLAD 50%, dLCx 20%, OM2 100%,>> PCI: 2.5 x 14 mm Resolute DES to OM2   . GERD (gastroesophageal reflux disease)   . History of non-ST elevation myocardial infarction (NSTEMI)    Noted to have occluded OM 2 treated with DES stent  . Hyperlipidemia 09/17/2015  . Hypertension   . Hypertensive heart disease 09/17/2015  . Myocardial infarction (HCC)    11/2015  . Pneumonia    1998  . PONV (postoperative nausea and vomiting)    After Cath    Past Surgical History:  Procedure Laterality Date  . BACK SURGERY     x2 2003 and 2005  . CARDIAC CATHETERIZATION N/A 09/17/2015   Procedure: Left Heart Cath and Coronary Angiography;  Surgeon: [name], MD;  [facility];  Service: Cardiovascular: EF 55%. pLAD 40%, mLAD 50%, dLCx 20%, OM2 100%,>> PCI: 2.5 x 14 mm Resolute DES to OM2   . CARDIAC CATHETERIZATION N/A 09/17/2015   Procedure: Coronary Stent Intervention;  Surgeon: [name], MD;  [facility];  Service: Cardiovascular:  PCI: 2.5 x 14 mm Resolute DES to OM2   . KNEE ARTHROSCOPY     Left 1994  . ROTATOR CUFF REPAIR  Right   . TOTAL KNEE ARTHROPLASTY Left 02/12/2017   Procedure: LEFT TOTAL KNEE ARTHROPLASTY;  Surgeon: [name], Jeffrey, MD;  Location: [facility];  Service: Orthopedics;  Laterality: Left;  120 mins  . TRANSTHORACIC ECHOCARDIOGRAM  09/17/2015   Normal LV size and thickness. EF 50-55%. No regional wall motion abnormality. GR 1 DD.    There were no vitals filed for this visit.  Subjective Assessment - 04/06/17 0738    Subjective  Reports that he only has pain when he moves his knee.    Pertinent History  Chronic right knee pain.  MI.  Back surgery. Right RTC repair.    How long can you walk comfortably?  Short distances around home with straight cane.    Patient Stated Goals  Get around without knee pain.    Currently in Pain?  Yes    Pain Score  2     Pain Location  Knee    Pain Orientation  Left    Pain Descriptors / Indicators  Discomfort    Pain Type  Surgical pain    Pain Onset  More than a month ago    Pain Frequency  Intermittent    Aggravating Factors   Knee movement  [facility] PT Assessment - 04/06/17 0001      Assessment   Medical Diagnosis  Left total knee replacement.    Onset Date/Surgical Date  02/12/17    Next MD Visit  04/2017      Restrictions   Weight Bearing Restrictions  No                  [facility] Adult PT Treatment/Exercise - 04/06/17 0001      Knee/Hip Exercises: Aerobic   Nustep  Level 6, x15 seat 5      Knee/Hip Exercises: Machines for Strengthening   Cybex Knee Extension  20# 3x10 reps    Cybex Knee Flexion  40# 3x10 reps    Cybex Leg Press  2.5 pl, seat 5 x30 reps      Modalities   Modalities  Electrical Stimulation;Vasopneumatic      Electrical Stimulation   Electrical Stimulation Location  L knee    Electrical Stimulation Action  IFC    Electrical Stimulation Parameters  1-10 hz x15 min    Electrical Stimulation Goals  Pain;Edema      Vasopneumatic   Number Minutes Vasopneumatic   15 minutes    Vasopnuematic Location   Knee     Vasopneumatic Pressure  Medium    Vasopneumatic Temperature   34      Manual Therapy   Manual Therapy  Myofascial release;Passive ROM    Myofascial Release  IASTW to L distal quad, ITB, patellar tendon to reduce adhesions to increase ROM    Passive ROM  PROM of L knee into flexion with holds at end range                  PT Long Term Goals - 03/26/17 4132      PT LONG TERM GOAL #1   Title  Independent with a HEP.    Time  4    Period  Weeks    Status  Achieved      PT LONG TERM GOAL #2   Title  Active left knee flexion to 115 degrees+ so the patient can perform functional tasks and do so with pain not > 2-3/10.    Time  4    Status  On-going      PT LONG TERM GOAL #3   Title  Increase left knee strength to a solid 4+/5 to provide good stability for accomplishment of functional activities.    Time  4    Status  Achieved      PT LONG TERM GOAL #4   Title  Full left active knee extension in order to normalize gait.    Time  4    Period  Weeks    Status  On-going      PT LONG TERM GOAL #5   Title  Perform a reciprocating stair gait with one railing with pain not > 2-3/10.    Time  4    Period  Weeks    Status  On-going            Plan - 04/06/17 0804    Clinical Impression Statement  Patient tolerated today's treatment well as he reports pain only with movement. Patient able to complete all machine strengthening exercises with no complaints of pain. IASTW completed to L distal quad, ITB and patellar tendon to reduce adhesions and promote increased ROM into flexion. Patient demonstrated a great redness response to IASTW to treated areas and was educated that he may experience  soreness following treatment. Patient reported feeling as if he had more ROM following IASTW session. Normal modalities response noted following removal of the modalities. Patient educated that with new avaliabe range after IASTW that he should continue ROM exercises as well as walking to  continue improving.    Rehab Potential  Good    PT Frequency  3x / week    PT Duration  4 weeks    PT Treatment/Interventions  ADLs/Self Care Home Management;Electrical Stimulation;Cryotherapy;Therapeutic exercise;Therapeutic activities;Stair training;Gait training;Patient/family education;Passive range of motion;Manual techniques;Vasopneumatic Device    PT Next Visit Plan  Consider completing IASTW to L patellar tendon and distal quad to reduce tone and pain.    Consulted and Agree with Plan of Care  Patient       Patient will benefit from skilled therapeutic intervention in order to improve the following deficits and impairments:  Abnormal gait, Decreased activity tolerance, Decreased range of motion, Increased edema, Pain, Decreased strength  Visit Diagnosis: Localized edema  Chronic pain of left knee  Stiffness of left knee, not elsewhere classified     Problem List Patient Active Problem List   Diagnosis Date Noted  . Primary osteoarthritis of left knee 02/12/2017  . 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 Coronary artery disease involving native coronary artery of native heart with unstable angina pectoris (HCC) 09/25/2015  . Status post coronary artery stent placement   . Hypertensive heart disease 09/17/2015  . Hyperlipidemia with target low density lipoprotein (LDL) cholesterol less than 70 mg/dL 08/65/7846  . NSTEMI (non-ST elevated myocardial infarction) [facility]) 09/16/2015    Marvell Fuller, PTA 04/06/2017, 9:03 AM  [facility] [address] [place], [place], [address] Phone: [phone]   Fax:  [phone]  Name: DAELYN MOZER MRN: [id number] Date of Birth: April 22, 1965

## 2017-04-08 ENCOUNTER — Encounter: Payer: Self-pay | Admitting: Physical Therapy

## 2017-04-08 ENCOUNTER — Ambulatory Visit: Payer: BLUE CROSS/BLUE SHIELD | Admitting: Physical Therapy

## 2017-04-08 DIAGNOSIS — G8929 Other chronic pain: Secondary | ICD-10-CM

## 2017-04-08 DIAGNOSIS — M25662 Stiffness of left knee, not elsewhere classified: Secondary | ICD-10-CM

## 2017-04-08 DIAGNOSIS — M25562 Pain in left knee: Secondary | ICD-10-CM

## 2017-04-08 DIAGNOSIS — R600 Localized edema: Secondary | ICD-10-CM

## 2017-04-08 NOTE — Therapy (Signed)
[facility] [address] [place], [place], [address] Phone: [phone]   Fax:  [phone]  Physical Therapy Treatment  Patient Details  Name: [name] MRN: [id number] Date of Birth: August 14, 1965 Referring Provider: Jene EveryJeffrey Beane MD   Encounter Date: 04/08/2017  PT End of Session - 04/08/17 0745    Visit Number  14    Number of Visits  18    Date for PT Re-Evaluation  05/04/17    Authorization Type  FOTO at leasr every 5th visit.    PT Start Time  0730    PT Stop Time  0823    PT Time Calculation (min)  53 min    Activity Tolerance  Patient tolerated treatment well    Behavior During Therapy  [facility] for tasks assessed/performed       Past Medical History:  Diagnosis Date  . Arthritis   . CAD S/P percutaneous coronary angioplasty    a. s/p NSTEMI 7/17:  EF 55%. pLAD 40%, mLAD 50%, dLCx 20%, OM2 100%,>> PCI: 2.5 x 14 mm Resolute DES to OM2   . GERD (gastroesophageal reflux disease)   . History of non-ST elevation myocardial infarction (NSTEMI)    Noted to have occluded OM 2 treated with DES stent  . Hyperlipidemia 09/17/2015  . Hypertension   . Hypertensive heart disease 09/17/2015  . Myocardial infarction (HCC)    11/2015  . Pneumonia    1998  . PONV (postoperative nausea and vomiting)    After Cath    Past Surgical History:  Procedure Laterality Date  . BACK SURGERY     x2 2003 and 2005  . CARDIAC CATHETERIZATION N/A 09/17/2015   Procedure: Left Heart Cath and Coronary Angiography;  Surgeon: [name], MD;  [facility];  Service: Cardiovascular: EF 55%. pLAD 40%, mLAD 50%, dLCx 20%, OM2 100%,>> PCI: 2.5 x 14 mm Resolute DES to OM2   . CARDIAC CATHETERIZATION N/A 09/17/2015   Procedure: Coronary Stent Intervention;  Surgeon: [name], MD;  [facility];  Service: Cardiovascular:  PCI: 2.5 x 14 mm Resolute DES to OM2   . KNEE ARTHROSCOPY     Left 1994  . ROTATOR CUFF REPAIR  Right   . TOTAL KNEE ARTHROPLASTY Left 02/12/2017   Procedure: LEFT TOTAL KNEE ARTHROPLASTY;  Surgeon: [name], Jeffrey, MD;  Location: [facility];  Service: Orthopedics;  Laterality: Left;  120 mins  . TRANSTHORACIC ECHOCARDIOGRAM  09/17/2015   Normal LV size and thickness. EF 50-55%. No regional wall motion abnormality. GR 1 DD.    There were no vitals filed for this visit.  Subjective Assessment - 04/08/17 0729    Subjective  Reports soreness in the low back and hip region but states that he is also sore from IASTW last treatment.    Pertinent History  Chronic right knee pain.  MI.  Back surgery. Right RTC repair.    How long can you walk comfortably?  Short distances around home with straight cane.    Patient Stated Goals  Get around without knee pain.    Currently in Pain?  No/denies         [facility] PT Assessment - 04/08/17 0001      Assessment   Medical Diagnosis  Left total knee replacement.    Onset Date/Surgical Date  02/12/17    Next MD Visit  04/2017      Restrictions   Weight Bearing Restrictions  No  ROM / Strength   AROM / PROM / Strength  AROM      AROM   Overall AROM   Deficits    AROM Assessment Site  Knee    Right/Left Knee  Left    Left Knee Extension  -8    Left Knee Flexion  92                  [facility] Adult PT Treatment/Exercise - 04/08/17 0001      Knee/Hip Exercises: Aerobic   Nustep  L6 x12 min      Knee/Hip Exercises: Machines for Strengthening   Cybex Knee Extension  20# 3x10 reps    Cybex Knee Flexion  40# 3x10 reps      Knee/Hip Exercises: Supine   Short Arc Quad Sets  Strengthening;Left with VMS for quad strengthening    Knee Flexion  AAROM;Left;5 reps for 1 min each with 4# for ROM improvement      Knee/Hip Exercises: Prone   Prone Knee Hang  3 minutes 4# overpressure      Modalities   Modalities  Electrical Stimulation;Vasopneumatic      Programme researcher, broadcasting/film/video Location  L VMO/ Teacher, early years/pre Action  VMS    Electrical Stimulation Parameters  10/10, 5 sec ramp, 50 pps, 300 usec x10 min with SAQ    Electrical Stimulation Goals  Strength      Vasopneumatic   Number Minutes Vasopneumatic   15 minutes    Vasopnuematic Location   Knee    Vasopneumatic Pressure  Medium    Vasopneumatic Temperature   34                  PT Long Term Goals - 03/26/17 1610      PT LONG TERM GOAL #1   Title  Independent with a HEP.    Time  4    Period  Weeks    Status  Achieved      PT LONG TERM GOAL #2   Title  Active left knee flexion to 115 degrees+ so the patient can perform functional tasks and do so with pain not > 2-3/10.    Time  4    Status  On-going      PT LONG TERM GOAL #3   Title  Increase left knee strength to a solid 4+/5 to provide good stability for accomplishment of functional activities.    Time  4    Status  Achieved      PT LONG TERM GOAL #4   Title  Full left active knee extension in order to normalize gait.    Time  4    Period  Weeks    Status  On-going      PT LONG TERM GOAL #5   Title  Perform a reciprocating stair gait with one railing with pain not > 2-3/10.    Time  4    Period  Weeks    Status  On-going            Plan - 04/08/17 0806    Clinical Impression Statement  Patient progressed through more advanced ROM exercises with 4# overpressure in order to increase ROM. Patient reported more soreness in L quad and low back region upon arrival today and with exercises. Patient continues to report times of L knee giving out which has happend several times in the last few days per patient report. VMS to L  VMO and quad was continued to further strengthen the L quad. AROM measured with minimal improvement without PROM completed prior to measurement. Normal modalities response noted following removal of the modalities.    Rehab Potential  Good    PT Frequency  3x / week    PT Duration  4 weeks    PT Treatment/Interventions  ADLs/Self  Care Home Management;Electrical Stimulation;Cryotherapy;Therapeutic exercise;Therapeutic activities;Stair training;Gait training;Patient/family education;Passive range of motion;Manual techniques;Vasopneumatic Device    PT Next Visit Plan  Consider completing IASTW to L patellar tendon and distal quad to reduce tone and pain. Please continue VMS to L quad for strengthening.    Consulted and Agree with Plan of Care  Patient       Patient will benefit from skilled therapeutic intervention in order to improve the following deficits and impairments:  Abnormal gait, Decreased activity tolerance, Decreased range of motion, Increased edema, Pain, Decreased strength  Visit Diagnosis: Localized edema  Chronic pain of left knee  Stiffness of left knee, not elsewhere classified     Problem List Patient Active Problem List   Diagnosis Date Noted  . Primary osteoarthritis of left knee 02/12/2017  . 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 Coronary artery disease involving native coronary artery of native heart with unstable angina pectoris (HCC) 09/25/2015  . Status post coronary artery stent placement   . Hypertensive heart disease 09/17/2015  . Hyperlipidemia with target low density lipoprotein (LDL) cholesterol less than 70 mg/dL 96/06/5407  . NSTEMI (non-ST elevated myocardial infarction) (HCC) 09/16/2015    [name], PTA 04/08/2017, 8:47 AM  [facility] [address] [place], [place], [address] Phone: [phone]   Fax:  [phone]  Name: [name] MRN: [id number] Date of Birth: 1965-10-29

## 2017-04-10 ENCOUNTER — Encounter: Payer: Self-pay | Admitting: Physical Therapy

## 2017-04-10 ENCOUNTER — Ambulatory Visit: Payer: BLUE CROSS/BLUE SHIELD | Attending: Specialist | Admitting: Physical Therapy

## 2017-04-10 DIAGNOSIS — M25562 Pain in left knee: Secondary | ICD-10-CM | POA: Insufficient documentation

## 2017-04-10 DIAGNOSIS — R600 Localized edema: Secondary | ICD-10-CM | POA: Insufficient documentation

## 2017-04-10 DIAGNOSIS — M25662 Stiffness of left knee, not elsewhere classified: Secondary | ICD-10-CM | POA: Diagnosis present

## 2017-04-10 DIAGNOSIS — G8929 Other chronic pain: Secondary | ICD-10-CM | POA: Diagnosis present

## 2017-04-10 NOTE — Therapy (Addendum)
[facility] [address] [place], [place], [address] Phone: [phone]   Fax:  [phone]  Physical Therapy Treatment  Patient Details  Name: [name] MRN: [id number] Date of Birth: 06-Jan-1966 Referring Provider: [name] MD   Encounter Date: 04/10/2017  PT End of Session - 04/10/17 0735    Visit Number  15    Number of Visits  18    Date for PT Re-Evaluation  05/04/17    Authorization Type  FOTO at leasr every 5th visit.    PT Start Time  0730    PT Stop Time  0818    PT Time Calculation (min)  48 min    Activity Tolerance  Patient tolerated treatment well    Behavior During Therapy  WFL for tasks assessed/performed       Past Medical History:  Diagnosis Date  . Arthritis   . CAD S/P percutaneous coronary angioplasty    a. s/p NSTEMI 7/17:  EF 55%. pLAD 40%, mLAD 50%, dLCx 20%, OM2 100%,>> PCI: 2.5 x 14 mm Resolute DES to OM2   . GERD (gastroesophageal reflux disease)   . History of non-ST elevation myocardial infarction (NSTEMI)    Noted to have occluded OM 2 treated with DES stent  . Hyperlipidemia 09/17/2015  . Hypertension   . Hypertensive heart disease 09/17/2015  . Myocardial infarction (HCC)    11/2015  . Pneumonia    1998  . PONV (postoperative nausea and vomiting)    After Cath    Past Surgical History:  Procedure Laterality Date  . BACK SURGERY     x2 2003 and 2005  . CARDIAC CATHETERIZATION N/A 09/17/2015   Procedure: Left Heart Cath and Coronary Angiography;  Surgeon: [name], MD;  Location[facility]ASIVE CV LAB;  Service: Cardiovascular: EF 55%. pLAD 40%, mLAD 50%, dLCx 20%, OM2 100%,>> PCI: 2.5 x 14 mm Resolute DES to OM2   . CARDIAC CATHETERIZATION N/A 09/17/2015   Procedure: Coronary Stent Intervention;  Surgeon: [name], MD;  [facility] LAB;  Service: Cardiovascular:  PCI: 2.5 x 14 mm Resolute DES to OM2   . KNEE ARTHROSCOPY     Left 1994  . ROTATOR CUFF REPAIR  Right   . TOTAL KNEE ARTHROPLASTY Left 02/12/2017   Procedure: LEFT TOTAL KNEE ARTHROPLASTY;  Surgeon: [name], MD;  Location: [facility];  Service: Orthopedics;  Laterality: Left;  120 mins  . TRANSTHORACIC ECHOCARDIOGRAM  09/17/2015   Normal LV size and thickness. EF 50-55%. No regional wall motion abnormality. GR 1 DD.    There were no vitals filed for this visit.  Subjective Assessment - 04/10/17 0733    Subjective  Reports that his knee feels good. His treatment on Monday he has to leave by 8:15 for another appointment.    Pertinent History  Chronic right knee pain.  MI.  Back surgery. Right RTC repair.    How long can you walk comfortably?  Short distances around home with straight cane.    Patient Stated Goals  Get around without knee pain.    Currently in Pain?  No/denies         [facility] PT Assessment - 04/10/17 0001      Assessment   Medical Diagnosis  Left total knee replacement.    Onset Date/Surgical Date  02/12/17    Next MD Visit  04/2017      Restrictions   Weight Bearing Restrictions  No  AROM   Left Knee Extension  -5    Left Knee Flexion  100                  [facility] Adult PT Treatment/Exercise - 04/10/17 0001      Knee/Hip Exercises: Aerobic   Nustep  L6 x10 min      Knee/Hip Exercises: Machines for Strengthening   Cybex Knee Extension  20# 3x10 reps    Cybex Knee Flexion  40# 3x10 reps    Cybex Leg Press  2.5 pl, seat 5 x30 reps      Knee/Hip Exercises: Supine   Short Arc Quad Sets  Strengthening;Left with VMS    Knee Flexion  AAROM;Left;5 reps 4# for ROM for 1 min each      Knee/Hip Exercises: Prone   Prone Knee Hang  3 minutes 4# ankleweight      Modalities   Modalities  Electrical Stimulation;Vasopneumatic      Programme researcher, broadcasting/film/videolectrical Stimulation   Electrical Stimulation Location  L VMO/ Research scientist (medical)Quad    Electrical Stimulation Action  VMS     Electrical Stimulation Parameters  10/10, 5 sec ramp, 50 pps, 300 usec x10 min for Corporate investment bankerAQ    Electrical  Stimulation Goals  Strength      Vasopneumatic   Number Minutes Vasopneumatic   --    Vasopnuematic Location   --    Vasopneumatic Pressure  --    Vasopneumatic Temperature   --      Manual Therapy   Manual Therapy  Passive ROM    Passive ROM  PROM of L knee into flexion, extension with holds at end range                  PT Long Term Goals - 03/26/17 0843      PT LONG TERM GOAL #1   Title  Independent with a HEP.    Time  4    Period  Weeks    Status  Achieved      PT LONG TERM GOAL #2   Title  Active left knee flexion to 115 degrees+ so the patient can perform functional tasks and do so with pain not > 2-3/10.    Time  4    Status  On-going      PT LONG TERM GOAL #3   Title  Increase left knee strength to a solid 4+/5 to provide good stability for accomplishment of functional activities.    Time  4    Status  Achieved      PT LONG TERM GOAL #4   Title  Full left active knee extension in order to normalize gait.    Time  4    Period  Weeks    Status  On-going      PT LONG TERM GOAL #5   Title  Perform a reciprocating stair gait with one railing with pain not > 2-3/10.    Time  4    Period  Weeks    Status  On-going            Plan - 04/10/17 0813    Clinical Impression Statement  Patient presented in clinic with denial of any knee pain. Patient continued with knee strengthening that also assisted ROM. Patient still felt good stretch with rockerboard that extend into L knee area. AROM of L knee measured as 5-100 deg today following exercises and PROM. VMS completed to L VMO and quad for advanced strengthening. Normal stimulation response  noted following remova of the modality.     Rehab Potential  Good    PT Frequency  3x / week    PT Duration  4 weeks    PT Treatment/Interventions  ADLs/Self Care Home Management;Electrical Stimulation;Cryotherapy;Therapeutic exercise;Therapeutic activities;Stair training;Gait training;Patient/family  education;Passive range of motion;Manual techniques;Vasopneumatic Device    PT Next Visit Plan  Consider completing IASTW to L patellar tendon and distal quad to reduce tone and pain. Please continue VMS to L quad for strengthening.    Consulted and Agree with Plan of Care  Patient       Patient will benefit from skilled therapeutic intervention in order to improve the following deficits and impairments:  Abnormal gait, Decreased activity tolerance, Decreased range of motion, Increased edema, Pain, Decreased strength  Visit Diagnosis: Localized edema  Chronic pain of left knee  Stiffness of left knee, not elsewhere classified     Problem List Patient Active Problem List   Diagnosis Date Noted  . Primary osteoarthritis of left knee 02/12/2017  . 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 Coronary artery disease involving native coronary artery of native heart with unstable angina pectoris (HCC) 09/25/2015  . Status post coronary artery stent placement   . Hypertensive heart disease 09/17/2015  . Hyperlipidemia with target low density lipoprotein (LDL) cholesterol less than 70 mg/dL 16/12/9602  . NSTEMI (non-ST elevated myocardial infarction) (HCC) 09/16/2015    Marvell Fuller, PTA 04/10/2017, 8:54 AM  [facility] [address] [place], [place], [address] Phone: [phone]   Fax:  [phone]  Name: [name] MRN: [id number] Date of Birth: 1965-10-25

## 2017-04-13 ENCOUNTER — Ambulatory Visit: Payer: BLUE CROSS/BLUE SHIELD | Admitting: Physical Therapy

## 2017-04-13 DIAGNOSIS — M25662 Stiffness of left knee, not elsewhere classified: Secondary | ICD-10-CM

## 2017-04-13 DIAGNOSIS — G8929 Other chronic pain: Secondary | ICD-10-CM

## 2017-04-13 DIAGNOSIS — M25562 Pain in left knee: Secondary | ICD-10-CM

## 2017-04-13 DIAGNOSIS — R600 Localized edema: Secondary | ICD-10-CM | POA: Diagnosis not present

## 2017-04-13 NOTE — Therapy (Signed)
[facility] [address] [place], [place], [address] Phone: [phone]   Fax:  [phone]  Physical Therapy Treatment  Patient Details  Name: [name] MRN: [id number] Date of Birth: [birth year] Referring Provider: [name] MD   Encounter Date: 04/13/2017  PT End of Session - 04/13/17 0732    Visit Number  16    Number of Visits  18    Date for PT Re-Evaluation  05/04/17    Authorization Type  FOTO at leasr every 5th visit.    PT Start Time  0730    PT Stop Time  0819    PT Time Calculation (min)  49 min    Activity Tolerance  Patient tolerated treatment well    Behavior During Therapy  WFL for tasks assessed/performed       Past Medical History:  Diagnosis Date  . Arthritis   . CAD S/P percutaneous coronary angioplasty    a. s/p NSTEMI 7/17:  EF 55%. pLAD 40%, mLAD 50%, dLCx 20%, OM2 100%,>> PCI: 2.5 x 14 mm Resolute DES to OM2   . GERD (gastroesophageal reflux disease)   . History of non-ST elevation myocardial infarction (NSTEMI)    Noted to have occluded OM 2 treated with DES stent  . Hyperlipidemia 09/17/2015  . Hypertension   . Hypertensive heart disease 09/17/2015  . Myocardial infarction (HCC)    11/2015  . Pneumonia    1998  . PONV (postoperative nausea and vomiting)    After Cath    Past Surgical History:  Procedure Laterality Date  . BACK SURGERY     x2 2003 and 2005  . CARDIAC CATHETERIZATION N/A 09/17/2015   Procedure: Left Heart Cath and Coronary Angiography;  Surgeon: [name], MD;  Location: [facility] INVASIVE CV LAB;  Service: Cardiovascular: EF 55%. pLAD 40%, mLAD 50%, dLCx 20%, OM2 100%,>> PCI: 2.5 x 14 mm Resolute DES to OM2   . CARDIAC CATHETERIZATION N/A 09/17/2015   Procedure: Coronary Stent Intervention;  Surgeon: [name], MD;  Location: [facility] INVASIVE CV LAB;  Service: Cardiovascular:  PCI: 2.5 x 14 mm Resolute DES to OM2   . KNEE ARTHROSCOPY     Left 1994  . ROTATOR CUFF REPAIR  Right   . TOTAL KNEE ARTHROPLASTY Left 02/12/2017   Procedure: LEFT TOTAL KNEE ARTHROPLASTY;  Surgeon: [name], MD;  Location: [facility];  Service: Orthopedics;  Laterality: Left;  120 mins  . TRANSTHORACIC ECHOCARDIOGRAM  09/17/2015   Normal LV size and thickness. EF 50-55%. No regional wall motion abnormality. GR 1 DD.    There were no vitals filed for this visit.  Subjective Assessment - 04/13/17 0814    Subjective  Patient reported his knee feels good; he just feels discomfort under the knee cap during flexion. Patient also stated there is an increase of edema throughout the day.    How long can you walk comfortably?  Short distances around home with straight cane.    Patient Stated Goals  Get around without knee pain.    Currently in Pain?  No/denies                      [facility] Adult PT Treatment/Exercise - 04/13/17 0001      Knee/Hip Exercises: Stretches   Active Hamstring Stretch  Both;3 reps;30 seconds on step      Knee/Hip Exercises: Aerobic   Nustep  L6 x10' total; x5 min at seat 6. x5' at seat  5 to increase knee flexoin      Knee/Hip Exercises: Machines for Strengthening   Cybex Leg Press  2.5 pl, seat 5 x30 reps      Knee/Hip Exercises: Standing   Knee Flexion  --    Terminal Knee Extension  Strengthening;Theraband;2 sets Pink XTS      Knee/Hip Exercises: Supine   Short Arc Development worker, communityQuad Sets  Strengthening;Left with VMS      Modalities   Modalities  Research scientist (medical)lectrical Stimulation      Electrical Stimulation   Electrical Stimulation Location  L VMO/ Brewing technologistQuad    Electrical Stimulation Action  VMS    Electrical Stimulation Parameters  10/10, 5 sec ramp, 300usec x12 min    Electrical Stimulation Goals  Strength      Manual Therapy   Manual Therapy  Passive ROM    Soft tissue mobilization  IASTM to Quad tendon, patella tendon, and around patella to address knee stiffness and scar tissue    Passive ROM  PROM in sitting to L knee into flexion, extension with holds at  end range                  PT Long Term Goals - 03/26/17 0843      PT LONG TERM GOAL #1   Title  Independent with a HEP.    Time  4    Period  Weeks    Status  Achieved      PT LONG TERM GOAL #2   Title  Active left knee flexion to 115 degrees+ so the patient can perform functional tasks and do so with pain not > 2-3/10.    Time  4    Status  On-going      PT LONG TERM GOAL #3   Title  Increase left knee strength to a solid 4+/5 to provide good stability for accomplishment of functional activities.    Time  4    Status  Achieved      PT LONG TERM GOAL #4   Title  Full left active knee extension in order to normalize gait.    Time  4    Period  Weeks    Status  On-going      PT LONG TERM GOAL #5   Title  Perform a reciprocating stair gait with one railing with pain not > 2-3/10.    Time  4    Period  Weeks    Status  On-going            Plan - 04/13/17 0815    Clinical Impression Statement  Patient was able to complete exercises with no increase of pain, just discomfort under the knee cap. Patient was instructed to continue elevate and ice to decrease edema.  Patient also instructed to perform scar massage, especially at superior aspect of scar to improve scar mobility and flexibilty.       Patient will benefit from skilled therapeutic intervention in order to improve the following deficits and impairments:     Visit Diagnosis: Localized edema  Chronic pain of left knee  Stiffness of left knee, not elsewhere classified     Problem List Patient Active Problem List   Diagnosis Date Noted  . Primary osteoarthritis of left knee 02/12/2017  . 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 Coronary artery disease involving native coronary artery of native heart with unstable angina pectoris (HCC) 09/25/2015  . Status post coronary artery stent placement   . Hypertensive heart disease 09/17/2015  .  Hyperlipidemia with target low density lipoprotein (LDL) cholesterol  less than 70 mg/dL 16/12/9602  . NSTEMI (non-ST elevated myocardial infarction) (HCC) 09/16/2015   Guss Bunde, PT, DPT 04/13/2017, 8:28 AM  [facility] [address] [place], [place], [address] Phone: [phone]   Fax:  [phone]  Name: JAELIN FACKLER MRN: [id number] Date of Birth: June 22, 1965

## 2017-04-15 ENCOUNTER — Ambulatory Visit: Payer: BLUE CROSS/BLUE SHIELD | Admitting: Physical Therapy

## 2017-04-15 DIAGNOSIS — R600 Localized edema: Secondary | ICD-10-CM

## 2017-04-15 DIAGNOSIS — M25562 Pain in left knee: Secondary | ICD-10-CM

## 2017-04-15 DIAGNOSIS — M25662 Stiffness of left knee, not elsewhere classified: Secondary | ICD-10-CM

## 2017-04-15 DIAGNOSIS — G8929 Other chronic pain: Secondary | ICD-10-CM

## 2017-04-15 NOTE — Therapy (Addendum)
[facility] [address] [place], [place], [address] Phone: [phone]   Fax:  [phone]  Physical Therapy Treatment  Patient Details  Name: Victor FennelKendall M Forni MRN: [id number] Date of Birth: May 23, 1965 Referring Provider: Jene EveryJeffrey Beane MD   Encounter Date: 04/15/2017  PT End of Session - 04/15/17 0737    Visit Number  17    Number of Visits  18    Date for PT Re-Evaluation  05/04/17    Authorization Type  FOTO at leasr every 5th visit.    PT Start Time  0730    PT Stop Time  0820    PT Time Calculation (min)  50 min    Activity Tolerance  Patient tolerated treatment well    Behavior During Therapy  WFL for tasks assessed/performed       Past Medical History:  Diagnosis Date  . Arthritis   . CAD S/P percutaneous coronary angioplasty    a. s/p NSTEMI 7/17:  EF 55%. pLAD 40%, mLAD 50%, dLCx 20%, OM2 100%,>> PCI: 2.5 x 14 mm Resolute DES to OM2   . GERD (gastroesophageal reflux disease)   . History of non-ST elevation myocardial infarction (NSTEMI)    Noted to have occluded OM 2 treated with DES stent  . Hyperlipidemia 09/17/2015  . Hypertension   . Hypertensive heart disease 09/17/2015  . Myocardial infarction (HCC)    11/2015  . Pneumonia    1998  . PONV (postoperative nausea and vomiting)    After Cath    Past Surgical History:  Procedure Laterality Date  . BACK SURGERY     x2 2003 and 2005  . CARDIAC CATHETERIZATION N/A 09/17/2015   Procedure: Left Heart Cath and Coronary Angiography;  Surgeon: [name], MD;  [facility];  Service: Cardiovascular: EF 55%. pLAD 40%, mLAD 50%, dLCx 20%, OM2 100%,>> PCI: 2.5 x 14 mm Resolute DES to OM2   . CARDIAC CATHETERIZATION N/A 09/17/2015   Procedure: Coronary Stent Intervention;  Surgeon: [name], MD;  [facility];  Service: Cardiovascular:  PCI: 2.5 x 14 mm Resolute DES to OM2   . KNEE ARTHROSCOPY     Left 1994  . ROTATOR CUFF REPAIR  Right   . TOTAL KNEE ARTHROPLASTY Left 02/12/2017   Procedure: LEFT TOTAL KNEE ARTHROPLASTY;  Surgeon: [name], Jeffrey, MD;  Location: [facility];  Service: Orthopedics;  Laterality: Left;  120 mins  . TRANSTHORACIC ECHOCARDIOGRAM  09/17/2015   Normal LV size and thickness. EF 50-55%. No regional wall motion abnormality. GR 1 DD.    There were no vitals filed for this visit.  Subjective Assessment - 04/15/17 0751    Subjective  Patient reported he's more swollen and sore today. He was on his feet a lot yesterday. Patient reported he is expected to go back to work in 2 weeks. Work requirements include: long distance walking, climbing ladders, and kneeling    Pertinent History  Chronic right knee pain.  MI.  Back surgery. Right RTC repair.    How long can you walk comfortably?  Short distances around home with straight cane.    Patient Stated Goals  Get around without knee pain.    Currently in Pain?  Yes    Pain Score  2     Pain Location  Knee    Pain Orientation  Left    Pain Descriptors / Indicators  Aching;Sore    Pain Type  Surgical pain  Pain Onset  More than a month ago                      [facility] Adult PT Treatment/Exercise - 04/15/17 0001      Knee/Hip Exercises: Stretches   Active Hamstring Stretch  Both;3 reps;30 seconds    Quad Stretch  Left;30 seconds;3 reps Supine with strap    Knee: Self-Stretch to increase Flexion  Left;20 seconds;5 reps      Knee/Hip Exercises: Aerobic   Nustep  L6 x15 minutes moving seat up to increase flexion      Knee/Hip Exercises: Machines for Strengthening   Cybex Leg Press  2.5 pl, seat 5 x30 reps      Knee/Hip Exercises: Prone   Prone Knee Hang  4 minutes;Weights 4# with IASTM to hamstring tendons      Modalities   Modalities  Vasopneumatic      Vasopneumatic   Number Minutes Vasopneumatic   10 minutes    Vasopnuematic Location   Knee    Vasopneumatic Pressure  Medium    Vasopneumatic Temperature   34      Manual  Therapy   Manual Therapy  Passive ROM    Soft tissue mobilization  IASTM to hamstring tendons in prone with IASTM to address scar tissue and knee stiffness    Passive ROM  PROM in sitting to L knee into flexion, extension in supine with holds at end range, PNF contract relax to improve flexion                  PT Long Term Goals - 03/26/17 0843      PT LONG TERM GOAL #1   Title  Independent with a HEP.    Time  4    Period  Weeks    Status  Achieved      PT LONG TERM GOAL #2   Title  Active left knee flexion to 115 degrees+ so the patient can perform functional tasks and do so with pain not > 2-3/10.    Time  4    Status  On-going      PT LONG TERM GOAL #3   Title  Increase left knee strength to a solid 4+/5 to provide good stability for accomplishment of functional activities.    Time  4    Status  Achieved      PT LONG TERM GOAL #4   Title  Full left active knee extension in order to normalize gait.    Time  4    Period  Weeks    Status  On-going      PT LONG TERM GOAL #5   Title  Perform a reciprocating stair gait with one railing with pain not > 2-3/10.    Time  4    Period  Weeks    Status  On-going            Plan - 04/15/17 1610    Clinical Impression Statement  Patient was able to tolerate treatment with no increase of pain. L hamstrings till weak as noted by resisted knee flexion performed during PNF manual therapy. Patient opted for vasopneumatic only this session.     Clinical Presentation  Stable    Clinical Decision Making  Low    Rehab Potential  Good    PT Frequency  3x / week    PT Duration  4 weeks    PT Treatment/Interventions  ADLs/Self Care Home Management;Electrical Stimulation;Cryotherapy;Therapeutic  exercise;Therapeutic activities;Stair training;Gait training;Patient/family education;Passive range of motion;Manual techniques;Vasopneumatic Device    PT Next Visit Plan  L knee flexion exercises to strengthen, VMS for L quad for  strengthening.    Consulted and Agree with Plan of Care  Patient       Patient will benefit from skilled therapeutic intervention in order to improve the following deficits and impairments:  Abnormal gait, Decreased activity tolerance, Decreased range of motion, Increased edema, Pain, Decreased strength  Visit Diagnosis: Localized edema  Chronic pain of left knee  Stiffness of left knee, not elsewhere classified     Problem List Patient Active Problem List   Diagnosis Date Noted  . Primary osteoarthritis of left knee 02/12/2017  . 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 Coronary artery disease involving native coronary artery of native heart with unstable angina pectoris (HCC) 09/25/2015  . Status post coronary artery stent placement   . Hypertensive heart disease 09/17/2015  . Hyperlipidemia with target low density lipoprotein (LDL) cholesterol less than 70 mg/dL 16/12/9602  . NSTEMI (non-ST elevated myocardial infarction) (HCC) 09/16/2015   Guss Bunde, PT, DPT 04/15/2017, 8:33 AM  [facility] [address] [place], [place], [address] Phone: [phone]   Fax:  [phone]  Name: MAC DOWDELL MRN: [id number] Date of Birth: [birth year]

## 2017-04-17 ENCOUNTER — Ambulatory Visit: Payer: BLUE CROSS/BLUE SHIELD | Admitting: Physical Therapy

## 2017-04-17 DIAGNOSIS — M25662 Stiffness of left knee, not elsewhere classified: Secondary | ICD-10-CM

## 2017-04-17 DIAGNOSIS — R600 Localized edema: Secondary | ICD-10-CM | POA: Diagnosis not present

## 2017-04-17 DIAGNOSIS — M25562 Pain in left knee: Secondary | ICD-10-CM

## 2017-04-17 DIAGNOSIS — G8929 Other chronic pain: Secondary | ICD-10-CM

## 2017-04-17 NOTE — Therapy (Signed)
[facility] [address] [place], [place], [address] Phone: [phone]   Fax:  [phone]  Physical Therapy Treatment  Patient Details  Name: [name] MRN: [id number] Date of Birth: 05/16/1965 Referring Provider: [name] MD   Encounter Date: 04/17/2017  PT End of Session - 04/17/17 0731    Visit Number  18    Number of Visits  25    Date for PT Re-Evaluation  06/15/17    Authorization Type  FOTO at leasr every 5th visit.    PT Start Time  0730    PT Stop Time  0821    PT Time Calculation (min)  51 min    Activity Tolerance  Patient tolerated treatment well    Behavior During Therapy  [facility] for tasks assessed/performed       Past Medical History:  Diagnosis Date  . Arthritis   . CAD S/P percutaneous coronary angioplasty    a. s/p NSTEMI 7/17:  EF 55%. pLAD 40%, mLAD 50%, dLCx 20%, OM2 100%,>> PCI: 2.5 x 14 mm Resolute DES to OM2   . GERD (gastroesophageal reflux disease)   . History of non-ST elevation myocardial infarction (NSTEMI)    Noted to have occluded OM 2 treated with DES stent  . Hyperlipidemia 09/17/2015  . Hypertension   . Hypertensive heart disease 09/17/2015  . Myocardial infarction (HCC)    11/2015  . Pneumonia    1998  . PONV (postoperative nausea and vomiting)    After Cath    Past Surgical History:  Procedure Laterality Date  . BACK SURGERY     x2 2003 and 2005  . CARDIAC CATHETERIZATION N/A 09/17/2015   Procedure: Left Heart Cath and Coronary Angiography;  Surgeon: [name], MD;  Location: [facility] INVASIVE CV LAB;  Service: Cardiovascular: EF 55%. pLAD 40%, mLAD 50%, dLCx 20%, OM2 100%,>> PCI: 2.5 x 14 mm Resolute DES to OM2   . CARDIAC CATHETERIZATION N/A 09/17/2015   Procedure: Coronary Stent Intervention;  Surgeon: [name], MD;  Location: [facility] INVASIVE CV LAB;  Service: Cardiovascular:  PCI: 2.5 x 14 mm Resolute DES to OM2   . KNEE ARTHROSCOPY     Left 1994  . ROTATOR CUFF REPAIR  Right   . TOTAL KNEE ARTHROPLASTY Left 02/12/2017   Procedure: LEFT TOTAL KNEE ARTHROPLASTY;  Surgeon: [name], MD;  Location: [facility];  Service: Orthopedics;  Laterality: Left;  120 mins  . TRANSTHORACIC ECHOCARDIOGRAM  09/17/2015   Normal LV size and thickness. EF 50-55%. No regional wall motion abnormality. GR 1 DD.    There were no vitals filed for this visit.  Subjective Assessment - 04/17/17 0734    Subjective  Patient reports ongoing soreness and swelling. Patient mostly feeling pain under the knee cap during activity. Patient stated he increased his activity this week to prepare for work in the next two weeks but feels pain and swelling is affecting his overall mobility. Patient to see Doctor for follow up visit the week of 2/25.    Pertinent History  Chronic right knee pain.  MI.  Back surgery. Right RTC repair.    How long can you walk comfortably?  Short distances around home with straight cane.    Patient Stated Goals  Get around without knee pain.    Currently in Pain?  No/denies         [facility] PT Assessment - 04/17/17 0001      Assessment   Medical Diagnosis  Left total knee replacement.    Onset Date/Surgical Date  02/12/17    Next MD Visit  04/2017      Observation/Other Assessments   Focus on Therapeutic Outcomes (FOTO)   47% limitation      ROM / Strength   AROM / PROM / Strength  AROM      AROM   Overall AROM   Deficits    AROM Assessment Site  Knee    Right/Left Knee  Left    Left Knee Extension  -3    Left Knee Flexion  96                  [facility] Adult PT Treatment/Exercise - 04/17/17 0001      Knee/Hip Exercises: Stretches   Knee: Self-Stretch to increase Flexion  Left;3 reps;30 seconds      Knee/Hip Exercises: Aerobic   Nustep  L6 x15 minutes moving seat up to increase flexion      Knee/Hip Exercises: Machines for Strengthening   Cybex Knee Flexion  30# 3x10 emphasis on eccentric control      Knee/Hip Exercises: Standing    Terminal Knee Extension  Strengthening;Left Orange XTS x35    Forward Step Up  Left;2 sets;10 reps;Hand Hold: 1;Step Height: 8" to simulate ladder climbing    Wall Squat  20 reps 25x with emphasis of equal weight distribution      Knee/Hip Exercises: Seated   Stool Scoot - Round Trips  20 feet terminated due to pain in medial and lateral aspect of knee      Modalities   Modalities  Vasopneumatic      Vasopneumatic   Number Minutes Vasopneumatic   15 minutes    Vasopnuematic Location   Knee    Vasopneumatic Pressure  Medium    Vasopneumatic Temperature   34      Manual Therapy   Manual Therapy  Passive ROM    Passive ROM  PROM in supine L knee flexion with overpresure                  PT Long Term Goals - 04/17/17 1008      PT LONG TERM GOAL #1   Title  Independent with a HEP.    Time  4    Period  Weeks    Status  Achieved      PT LONG TERM GOAL #2   Title  Active left knee flexion to 115 degrees+ so the patient can perform functional tasks and do so with pain not > 2-3/10.    Time  4    Period  Weeks    Status  On-going 96 degrees L knee flexion AROM      PT LONG TERM GOAL #3   Title  Increase left knee strength to a solid 4+/5 to provide good stability for accomplishment of functional activities.    Period  Weeks    Status  Achieved      PT LONG TERM GOAL #4   Title  Full left active knee extension in order to normalize gait.    Period  Weeks    Status  On-going -3 L knee extension AROM      PT LONG TERM GOAL #5   Title  Perform a reciprocating stair gait with one railing with pain not > 2-3/10.    Time  4    Period  Weeks    Status  On-going  Plan - 04/17/17 4098    Clinical Impression Statement  Patient noted with increased pain today during ther-ex especially during hamstring focused stretngthening exercises. Patient PROM limited today secondary to pain, swelling and stiffness. Patient reported hes been more active to prepare for  work in 2 weeks resulting in more soreness and swelling at the end of the day and into the following morning. Goals are ongoing. Re-certification sent to MD to request 7 additional visits to address remaining goals and focus therapy on functional work related activities.    Clinical Presentation  Stable    Clinical Decision Making  Low    Rehab Potential  Good    PT Frequency  3x / week    PT Duration  4 weeks    PT Treatment/Interventions  ADLs/Self Care Home Management;Electrical Stimulation;Cryotherapy;Therapeutic exercise;Therapeutic activities;Stair training;Gait training;Patient/family education;Passive range of motion;Manual techniques;Vasopneumatic Device    PT Next Visit Plan  continue exercises for left knee strengthening and AROM. begin functional activities to prepare for return to work.    Consulted and Agree with Plan of Care  Patient       Patient will benefit from skilled therapeutic intervention in order to improve the following deficits and impairments:  Abnormal gait, Decreased activity tolerance, Decreased range of motion, Increased edema, Pain, Decreased strength  Visit Diagnosis: Stiffness of left knee, not elsewhere classified - Plan: PT plan of care cert/re-cert  Localized edema - Plan: PT plan of care cert/re-cert  Chronic pain of left knee - Plan: PT plan of care cert/re-cert     Problem List Patient Active Problem List   Diagnosis Date Noted  . Primary osteoarthritis of left knee 02/12/2017  . 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 Coronary artery disease involving native coronary artery of native heart with unstable angina pectoris (HCC) 09/25/2015  . Status post coronary artery stent placement   . Hypertensive heart disease 09/17/2015  . Hyperlipidemia with target low density lipoprotein (LDL) cholesterol less than 70 mg/dL 11/91/4782  . NSTEMI (non-ST elevated myocardial infarction) (HCC) 09/16/2015   Guss Bunde, PT, DPT 04/17/2017, 10:21 AM  [facility] [address] [place], [place], [address] Phone: [phone]   Fax:  [phone]  Name: [name] MRN: [id number] Date of Birth: February 04, 1966

## 2017-04-20 ENCOUNTER — Ambulatory Visit: Payer: BLUE CROSS/BLUE SHIELD | Admitting: Physical Therapy

## 2017-04-20 DIAGNOSIS — M25662 Stiffness of left knee, not elsewhere classified: Secondary | ICD-10-CM

## 2017-04-20 DIAGNOSIS — R600 Localized edema: Secondary | ICD-10-CM | POA: Diagnosis not present

## 2017-04-20 DIAGNOSIS — G8929 Other chronic pain: Secondary | ICD-10-CM

## 2017-04-20 DIAGNOSIS — M25562 Pain in left knee: Secondary | ICD-10-CM

## 2017-04-20 NOTE — Therapy (Addendum)
[facility] [address] [place], [place], [address] Phone: [phone]   Fax:  [phone]  Physical Therapy Treatment  Patient Details  Name: [name] MRN: [id number] Date of Birth: 09/04/1965 Referring Provider: [name] MD   Encounter Date: 04/20/2017  PT End of Session - 04/20/17 0808    Visit Number  19    Number of Visits  25    Date for PT Re-Evaluation  06/15/17    Authorization Type  FOTO at leasr every 5th visit.    PT Start Time  0730    PT Stop Time  0808 Patient denied modalities    PT Time Calculation (min)  38 min    Activity Tolerance  Patient tolerated treatment well    Behavior During Therapy  WFL for tasks assessed/performed       Past Medical History:  Diagnosis Date  . Arthritis   . CAD S/P percutaneous coronary angioplasty    a. s/p NSTEMI 7/17:  EF 55%. pLAD 40%, mLAD 50%, dLCx 20%, OM2 100%,>> PCI: 2.5 x 14 mm Resolute DES to OM2   . GERD (gastroesophageal reflux disease)   . History of non-ST elevation myocardial infarction (NSTEMI)    Noted to have occluded OM 2 treated with DES stent  . Hyperlipidemia 09/17/2015  . Hypertension   . Hypertensive heart disease 09/17/2015  . Myocardial infarction (HCC)    11/2015  . Pneumonia    1998  . PONV (postoperative nausea and vomiting)    After Cath    Past Surgical History:  Procedure Laterality Date  . BACK SURGERY     x2 2003 and 2005  . CARDIAC CATHETERIZATION N/A 09/17/2015   Procedure: Left Heart Cath and Coronary Angiography;  Surgeon: [name], MD;  Location: [facility] INVASIVE CV LAB;  Service: Cardiovascular: EF 55%. pLAD 40%, mLAD 50%, dLCx 20%, OM2 100%,>> PCI: 2.5 x 14 mm Resolute DES to OM2   . CARDIAC CATHETERIZATION N/A 09/17/2015   Procedure: Coronary Stent Intervention;  Surgeon: [name], MD;  Location: [facility] INVASIVE CV LAB;  Service: Cardiovascular:  PCI: 2.5 x 14 mm Resolute DES to OM2   . KNEE ARTHROSCOPY     Left 1994   . ROTATOR CUFF REPAIR Right   . TOTAL KNEE ARTHROPLASTY Left 02/12/2017   Procedure: LEFT TOTAL KNEE ARTHROPLASTY;  Surgeon: [name], MD;  Location: [facility];  Service: Orthopedics;  Laterality: Left;  120 mins  . TRANSTHORACIC ECHOCARDIOGRAM  09/17/2015   Normal LV size and thickness. EF 50-55%. No regional wall motion abnormality. GR 1 DD.    There were no vitals filed for this visit.  Subjective Assessment - 04/20/17 0813    Subjective  Reports being able to see some improvement with his ROM but reports that in the last several weeks of pain and inflammation being around the L patella.    Pertinent History  Chronic right knee pain.  MI.  Back surgery. Right RTC repair.    How long can you walk comfortably?  Short distances around home with straight cane.    Patient Stated Goals  Get around without knee pain.    Currently in Pain?  Other (Comment) No pain assessment provided by patient although reported by patient surrounding R patella as of recently         [facility] PT Assessment - 04/20/17 0001      Assessment   Medical Diagnosis  Left total knee replacement.    Onset  Date/Surgical Date  02/12/17    Next MD Visit  04/2017      Restrictions   Weight Bearing Restrictions  No      ROM / Strength   AROM / PROM / Strength  AROM      AROM   Overall AROM   Deficits    AROM Assessment Site  Knee    Right/Left Knee  Left    Left Knee Extension  -4    Left Knee Flexion  92                  [facility] Adult PT Treatment/Exercise - 04/20/17 0001      Knee/Hip Exercises: Aerobic   Nustep  L6 x13 minutes moving seat up to increase flexion      Knee/Hip Exercises: Machines for Strengthening   Cybex Knee Extension  20# x30 reps    Cybex Knee Flexion  40# 3x10 emphasis on eccentric control    Cybex Leg Press  3 pl, sesat 6 x30 reps for flexion      Knee/Hip Exercises: Standing   Terminal Knee Extension  Strengthening;Left Orange XTS x20 reps     Wall Squat  20 reps  focus on equal weight distribution      Manual Therapy   Manual Therapy  Passive ROM;Soft tissue mobilization    Soft tissue mobilization  L patella mobilizations in lateral, sup/inf directions    Passive ROM  PROM of L knee into flexion,extension with holds at end range                  PT Long Term Goals - 04/17/17 1008      PT LONG TERM GOAL #1   Title  Independent with a HEP.    Time  4    Period  Weeks    Status  Achieved      PT LONG TERM GOAL #2   Title  Active left knee flexion to 115 degrees+ so the patient can perform functional tasks and do so with pain not > 2-3/10.    Time  4    Period  Weeks    Status  On-going 96 degrees L knee flexion AROM      PT LONG TERM GOAL #3   Title  Increase left knee strength to a solid 4+/5 to provide good stability for accomplishment of functional activities.    Period  Weeks    Status  Achieved      PT LONG TERM GOAL #4   Title  Full left active knee extension in order to normalize gait.    Period  Weeks    Status  On-going -3 L knee extension AROM      PT LONG TERM GOAL #5   Title  Perform a reciprocating stair gait with one railing with pain not > 2-3/10.    Time  4    Period  Weeks    Status  On-going            Plan - 04/20/17 0818    Clinical Impression Statement  Patient tolerated today's treatment well as he arrived with reports of pain and inflammation mostly surrounding the L patella in the last several weeks. Eccentric focus on machine strengthening completed today as well as to assist with ROM. Patient able to demonstrate improved weight distribution with wall squats. PROM with overpressure at end range completed with AROM measured as 4-92 deg. Patient overall able to report somewhat improvement with ambulation  and knee extension per patient report. Patient denied any modalities today.    Rehab Potential  Good    PT Frequency  3x / week    PT Duration  4 weeks    PT Treatment/Interventions  ADLs/Self  Care Home Management;Electrical Stimulation;Cryotherapy;Therapeutic exercise;Therapeutic activities;Stair training;Gait training;Patient/family education;Passive range of motion;Manual techniques;Vasopneumatic Device    PT Next Visit Plan  continue exercises for left knee strengthening and AROM. begin functional activities to prepare for return to work.    Consulted and Agree with Plan of Care  Patient       Patient will benefit from skilled therapeutic intervention in order to improve the following deficits and impairments:  Abnormal gait, Decreased activity tolerance, Decreased range of motion, Increased edema, Pain, Decreased strength  Visit Diagnosis: Stiffness of left knee, not elsewhere classified  Localized edema  Chronic pain of left knee     Problem List Patient Active Problem List   Diagnosis Date Noted  . Primary osteoarthritis of left knee 02/12/2017  . 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 Coronary artery disease involving native coronary artery of native heart with unstable angina pectoris (HCC) 09/25/2015  . Status post coronary artery stent placement   . Hypertensive heart disease 09/17/2015  . Hyperlipidemia with target low density lipoprotein (LDL) cholesterol less than 70 mg/dL 16/12/9602  . NSTEMI (non-ST elevated myocardial infarction) [facility]) 09/16/2015    Marvell Fuller, PTA 04/20/2017, 8:34 AM  [facility] [address] [place], [place], [address] Phone: [phone]   Fax:  [phone]  Name: [name] MRN: [id number] Date of Birth: [birth year]

## 2017-04-22 ENCOUNTER — Ambulatory Visit: Payer: BLUE CROSS/BLUE SHIELD | Admitting: Physical Therapy

## 2017-04-22 DIAGNOSIS — R600 Localized edema: Secondary | ICD-10-CM

## 2017-04-22 DIAGNOSIS — G8929 Other chronic pain: Secondary | ICD-10-CM

## 2017-04-22 DIAGNOSIS — M25562 Pain in left knee: Secondary | ICD-10-CM

## 2017-04-22 DIAGNOSIS — M25662 Stiffness of left knee, not elsewhere classified: Secondary | ICD-10-CM

## 2017-04-22 NOTE — Therapy (Signed)
[facility] [address] [place], [place], [address] Phone: [phone]   Fax:  [phone]  Physical Therapy Treatment  Patient Details  Name: Marcello FennelKendall M Worlds MRN: [id number] Date of Birth: Aug 04, 1965 Referring Provider: Jene EveryJeffrey Beane MD   Encounter Date: 04/22/2017  PT End of Session - 04/22/17 0736    Visit Number  20    Number of Visits  25    Date for PT Re-Evaluation  06/15/17    Authorization Type  FOTO at leasr every 5th visit.    PT Start Time  0730    PT Stop Time  0816    PT Time Calculation (min)  46 min    Activity Tolerance  Patient tolerated treatment well    Behavior During Therapy  WFL for tasks assessed/performed       Past Medical History:  Diagnosis Date  . Arthritis   . CAD S/P percutaneous coronary angioplasty    a. s/p NSTEMI 7/17:  EF 55%. pLAD 40%, mLAD 50%, dLCx 20%, OM2 100%,>> PCI: 2.5 x 14 mm Resolute DES to OM2   . GERD (gastroesophageal reflux disease)   . History of non-ST elevation myocardial infarction (NSTEMI)    Noted to have occluded OM 2 treated with DES stent  . Hyperlipidemia 09/17/2015  . Hypertension   . Hypertensive heart disease 09/17/2015  . Myocardial infarction (HCC)    11/2015  . Pneumonia    1998  . PONV (postoperative nausea and vomiting)    After Cath    Past Surgical History:  Procedure Laterality Date  . BACK SURGERY     x2 2003 and 2005  . CARDIAC CATHETERIZATION N/A 09/17/2015   Procedure: Left Heart Cath and Coronary Angiography;  Surgeon: [name], MD;  [facility];  Service: Cardiovascular: EF 55%. pLAD 40%, mLAD 50%, dLCx 20%, OM2 100%,>> PCI: 2.5 x 14 mm Resolute DES to OM2   . CARDIAC CATHETERIZATION N/A 09/17/2015   Procedure: Coronary Stent Intervention;  Surgeon: [name], MD;  [facility];  Service: Cardiovascular:  PCI: 2.5 x 14 mm Resolute DES to OM2   . KNEE ARTHROSCOPY     Left 1994  . ROTATOR CUFF REPAIR  Right   . TOTAL KNEE ARTHROPLASTY Left 02/12/2017   Procedure: LEFT TOTAL KNEE ARTHROPLASTY;  Surgeon: [name], Jeffrey, MD;  Location: [facility];  Service: Orthopedics;  Laterality: Left;  120 mins  . TRANSTHORACIC ECHOCARDIOGRAM  09/17/2015   Normal LV size and thickness. EF 50-55%. No regional wall motion abnormality. GR 1 DD.    There were no vitals filed for this visit.  Subjective Assessment - 04/22/17 0737    Subjective  Patient reported no new complaints. Still with pain under the patella.    Pertinent History  Chronic right knee pain.  MI.  Back surgery. Right RTC repair.    How long can you walk comfortably?  Short distances around home with straight cane.    Patient Stated Goals  Get around without knee pain.    Currently in Pain?  No/denies         [facility] - 04/22/17 0001      Assessment   Medical Diagnosis  Left total knee replacement.    Onset Date/Surgical Date  02/12/17    Next MD Visit  04/2017      Restrictions   Weight Bearing Restrictions  No  [facility] Adult PT Treatment/Exercise - 04/22/17 0001      Knee/Hip Exercises: Stretches   Gastroc Stretch  Both;3 reps;30 seconds      Knee/Hip Exercises: Aerobic   Nustep  L6 x15 minutes moving seat up to increase flexion      Knee/Hip Exercises: Machines for Strengthening   Cybex Knee Flexion  40# 3x10 emphasis on eccentric control    Cybex Leg Press  3 pl, sesat 6 x30 reps for flexion      Knee/Hip Exercises: Standing   Step Down  Left;2 sets;10 reps;Hand Hold: 1;Step Height: 6"    SLS  3x30"      Manual Therapy   Manual Therapy  Passive ROM;Soft tissue mobilization;Joint mobilization    Joint Mobilization  L patella mobs inferior/superior and lateral    Soft tissue mobilization  IASTM to Quad tendon with L knee flexion    Passive ROM  PROM of L knee into flexion holds at end range                  PT Long Term Goals - 04/17/17 1008      PT LONG TERM GOAL #1    Title  Independent with a HEP.    Time  4    Period  Weeks    Status  Achieved      PT LONG TERM GOAL #2   Title  Active left knee flexion to 115 degrees+ so the patient can perform functional tasks and do so with pain not > 2-3/10.    Time  4    Period  Weeks    Status  On-going 96 degrees L knee flexion AROM      PT LONG TERM GOAL #3   Title  Increase left knee strength to a solid 4+/5 to provide good stability for accomplishment of functional activities.    Period  Weeks    Status  Achieved      PT LONG TERM GOAL #4   Title  Full left active knee extension in order to normalize gait.    Period  Weeks    Status  On-going -3 L knee extension AROM      PT LONG TERM GOAL #5   Title  Perform a reciprocating stair gait with one railing with pain not > 2-3/10.    Time  4    Period  Weeks    Status  On-going            Plan - 04/22/17 0825    Clinical Impression Statement  Patient was able to complete exercises with no increases of pain. Patient has difficulty with eccentric step downs requiring verbal cuing for less UE support and controlled descent. Patient stated the knee feels "looser" after IASTM to Quad tendon. Patient declined modalties but instructed to ice at home for edema control.     Clinical Presentation  Stable    Clinical Decision Making  Low    Rehab Potential  Good    PT Frequency  3x / week    PT Duration  4 weeks    PT Treatment/Interventions  ADLs/Self Care Home Management;Electrical Stimulation;Cryotherapy;Therapeutic exercise;Therapeutic activities;Stair training;Gait training;Patient/family education;Passive range of motion;Manual techniques;Vasopneumatic Device    PT Next Visit Plan  continue exercises for left knee strengthening and AROM. begin functional activities to prepare for return to work.    Consulted and Agree with Plan of Care  Patient       Patient will benefit from skilled therapeutic  intervention in order to improve the following  deficits and impairments:  Abnormal gait, Decreased activity tolerance, Decreased range of motion, Increased edema, Pain, Decreased strength  Visit Diagnosis: Stiffness of left knee, not elsewhere classified  Localized edema  Chronic pain of left knee     Problem List Patient Active Problem List   Diagnosis Date Noted  . Primary osteoarthritis of left knee 02/12/2017  . 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 Coronary artery disease involving native coronary artery of native heart with unstable angina pectoris (HCC) 09/25/2015  . Status post coronary artery stent placement   . Hypertensive heart disease 09/17/2015  . Hyperlipidemia with target low density lipoprotein (LDL) cholesterol less than 70 mg/dL 16/12/9602  . NSTEMI (non-ST elevated myocardial infarction) (HCC) 09/16/2015   Guss Bunde, PT, DPT 04/22/2017, 8:37 AM  [facility] [address] [place], [place], [address] Phone: [phone]   Fax:  [phone]  Name: COTTON BECKLEY MRN: [id number] Date of Birth: 1965/08/12

## 2017-04-24 ENCOUNTER — Ambulatory Visit: Payer: BLUE CROSS/BLUE SHIELD | Admitting: Physical Therapy

## 2017-04-24 ENCOUNTER — Encounter: Payer: Self-pay | Admitting: Physical Therapy

## 2017-04-24 DIAGNOSIS — R600 Localized edema: Secondary | ICD-10-CM | POA: Diagnosis not present

## 2017-04-24 DIAGNOSIS — M25562 Pain in left knee: Secondary | ICD-10-CM

## 2017-04-24 DIAGNOSIS — G8929 Other chronic pain: Secondary | ICD-10-CM

## 2017-04-24 DIAGNOSIS — M25662 Stiffness of left knee, not elsewhere classified: Secondary | ICD-10-CM

## 2017-04-24 NOTE — Therapy (Signed)
[facility] [name] [place], [place], [address] Phone: [phone]   Fax:  [phone]  Physical Therapy Treatment  Patient Details  Name: [name] MRN: [id number] Date of Birth: 28-Feb-1966 Referring Provider: Jene EveryJeffrey Beane MD   Encounter Date: 04/24/2017  PT End of Session - 04/24/17 0827    Visit Number  21    Number of Visits  25    Date for PT Re-Evaluation  06/15/17    Authorization Type  FOTO at leasr every 5th visit.    PT Start Time  [phone]    PT Stop Time  0900    PT Time Calculation (min)  43 min    Activity Tolerance  Patient tolerated treatment well    Behavior During Therapy  WFL for tasks assessed/performed       Past Medical History:  Diagnosis Date  . Arthritis   . CAD S/P percutaneous coronary angioplasty    a. s/p NSTEMI 7/17:  EF 55%. pLAD 40%, mLAD 50%, dLCx 20%, OM2 100%,>> PCI: 2.5 x 14 mm Resolute DES to OM2   . GERD (gastroesophageal reflux disease)   . History of non-ST elevation myocardial infarction (NSTEMI)    Noted to have occluded OM 2 treated with DES stent  . Hyperlipidemia 09/17/2015  . Hypertension   . Hypertensive heart disease 09/17/2015  . Myocardial infarction (HCC)    11/2015  . Pneumonia    1998  . PONV (postoperative nausea and vomiting)    After Cath    Past Surgical History:  Procedure Laterality Date  . BACK SURGERY     x2 2003 and 2005  . CARDIAC CATHETERIZATION N/A 09/17/2015   Procedure: Left Heart Cath and Coronary Angiography;  Surgeon: [name], MD;  [facility];  Service: Cardiovascular: EF 55%. pLAD 40%, mLAD 50%, dLCx 20%, OM2 100%,>> PCI: 2.5 x 14 mm Resolute DES to OM2   . CARDIAC CATHETERIZATION N/A 09/17/2015   Procedure: Coronary Stent Intervention;  Surgeon: [name], MD;  [facility];  Service: Cardiovascular:  PCI: 2.5 x 14 mm Resolute DES to OM2   . KNEE ARTHROSCOPY     Left 1994  . ROTATOR CUFF REPAIR  Right   . TOTAL KNEE ARTHROPLASTY Left 02/12/2017   Procedure: LEFT TOTAL KNEE ARTHROPLASTY;  Surgeon: [name], Jeffrey, MD;  Location: [facility];  Service: Orthopedics;  Laterality: Left;  120 mins  . TRANSTHORACIC ECHOCARDIOGRAM  09/17/2015   Normal LV size and thickness. EF 50-55%. No regional wall motion abnormality. GR 1 DD.    There were no vitals filed for this visit.  Subjective Assessment - 04/24/17 0826    Subjective  Reports pain still under the patella.    Pertinent History  Chronic right knee pain.  MI.  Back surgery. Right RTC repair.    How long can you walk comfortably?  Short distances around home with straight cane.    Patient Stated Goals  Get around without knee pain.    Currently in Pain?  Yes    Pain Score  3     Pain Location  Knee    Pain Orientation  Left    Pain Descriptors / Indicators  Discomfort    Pain Type  Surgical pain    Pain Onset  More than a month ago         [facility] Assessment - 04/24/17 0001      Assessment   Medical Diagnosis  Left total knee replacement.    Onset Date/Surgical Date  02/12/17    Next MD Visit  04/2017      Restrictions   Weight Bearing Restrictions  No      ROM / Strength   AROM / PROM / Strength  AROM      AROM   Overall AROM   Deficits    AROM Assessment Site  Knee    Right/Left Knee  Left    Left Knee Extension  -3    Left Knee Flexion  94                  [facility] Adult PT Treatment/Exercise - 04/24/17 0001      Knee/Hip Exercises: Aerobic   Nustep  L6 x15 minutes moving seat up to increase flexion      Knee/Hip Exercises: Machines for Strengthening   Cybex Knee Extension  10# 3x10 reps LLE only    Cybex Knee Flexion  30# 3x10 reps LLE only    Cybex Leg Press  3 pl, sesat 7 x30 reps for flexion      Knee/Hip Exercises: Standing   Terminal Knee Extension  Strengthening;Left;20 reps;Limitations    Terminal Knee Extension Limitations  Orange XTS    Step Down  Left;4 sets;10 reps;Hand Hold: 2;Step  Height: 4";Step Height: 6" x20 with 4", x20 reps 6"    Wall Squat  20 reps      Knee/Hip Exercises: Supine   Knee Flexion  AAROM;Left;3 sets;Limitations    Knee Flexion Limitations  30 sec holds with 4# overpressure for ROM      Manual Therapy   Soft tissue mobilization  IASTW to R knee, distal quad and ITB                  PT Long Term Goals - 04/17/17 1008      PT LONG TERM GOAL #1   Title  Independent with a HEP.    Time  4    Period  Weeks    Status  Achieved      PT LONG TERM GOAL #2   Title  Active left knee flexion to 115 degrees+ so the patient can perform functional tasks and do so with pain not > 2-3/10.    Time  4    Period  Weeks    Status  On-going 96 degrees L knee flexion AROM      PT LONG TERM GOAL #3   Title  Increase left knee strength to a solid 4+/5 to provide good stability for accomplishment of functional activities.    Period  Weeks    Status  Achieved      PT LONG TERM GOAL #4   Title  Full left active knee extension in order to normalize gait.    Period  Weeks    Status  On-going -3 L knee extension AROM      PT LONG TERM GOAL #5   Title  Perform a reciprocating stair gait with one railing with pain not > 2-3/10.    Time  4    Period  Weeks    Status  On-going            Plan - 04/24/17 0907    Clinical Impression Statement  Patient tolerated today's treatment well although patient continues to experience greater pain surrounding the L patella. Patient guided through machine resisted strengthening exercises as well as bodyweight exercises with only minimal compensatory strategies noted with  wall squats. Patient still deficient with descending stairs due to instability and quad weakness. Patient presented with reports of inflammation of the L knee. Step downs were completed in efforts to advance quad strengthening as well as to train proficiently in descending stairs. Great redness response noted with IASTW surrounding the L patella  into distal L ITB and quad. AROM of R knee measured as 3-94 deg following all of treatment. Patient has been attempting to wear socks and shoes to acclimate to work conditions where he reports that swelling with appear just above sock line.    Rehab Potential  Good    PT Frequency  3x / week    PT Duration  4 weeks    PT Treatment/Interventions  ADLs/Self Care Home Management;Electrical Stimulation;Cryotherapy;Therapeutic exercise;Therapeutic activities;Stair training;Gait training;Patient/family education;Passive range of motion;Manual techniques;Vasopneumatic Device    PT Next Visit Plan  continue exercises for left knee strengthening and AROM. begin functional activities to prepare for return to work.    Consulted and Agree with Plan of Care  Patient       Patient will benefit from skilled therapeutic intervention in order to improve the following deficits and impairments:  Abnormal gait, Decreased activity tolerance, Decreased range of motion, Increased edema, Pain, Decreased strength  Visit Diagnosis: Stiffness of left knee, not elsewhere classified  Localized edema  Chronic pain of left knee     Problem List Patient Active Problem List   Diagnosis Date Noted  . Primary osteoarthritis of left knee 02/12/2017  . 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 Coronary artery disease involving native coronary artery of native heart with unstable angina pectoris (HCC) 09/25/2015  . Status post coronary artery stent placement   . Hypertensive heart disease 09/17/2015  . Hyperlipidemia with target low density lipoprotein (LDL) cholesterol less than 70 mg/dL 16/12/9602  . NSTEMI (non-ST elevated myocardial infarction) (HCC) 09/16/2015    Marvell Fuller, PTA 04/24/2017, 9:25 AM  [facility] [address] [place], [place], [address] Phone: [phone]   Fax:  [phone]  Name: [name] MRN: [id number] Date of Birth: November 16, 1965

## 2017-04-28 ENCOUNTER — Ambulatory Visit: Payer: BLUE CROSS/BLUE SHIELD | Admitting: Physical Therapy

## 2017-04-28 ENCOUNTER — Encounter: Payer: Self-pay | Admitting: Physical Therapy

## 2017-04-28 DIAGNOSIS — M25662 Stiffness of left knee, not elsewhere classified: Secondary | ICD-10-CM

## 2017-04-28 DIAGNOSIS — R600 Localized edema: Secondary | ICD-10-CM

## 2017-04-28 DIAGNOSIS — M25562 Pain in left knee: Secondary | ICD-10-CM

## 2017-04-28 DIAGNOSIS — G8929 Other chronic pain: Secondary | ICD-10-CM

## 2017-04-28 NOTE — Therapy (Signed)
[facility] [address] [place], [place], [address] Phone: [phone]   Fax:  [phone]  Physical Therapy Treatment  Patient Details  Name: [name] MRN: [id number] Date of Birth: 11-Apr-1965 Referring Provider: [name] MD   Encounter Date: 04/28/2017  PT End of Session - 04/28/17 0738    Visit Number  22    Number of Visits  25    Date for PT Re-Evaluation  06/15/17    Authorization Type  FOTO at leasr every 5th visit.    PT Start Time  0730    PT Stop Time  0814    PT Time Calculation (min)  44 min    Activity Tolerance  Patient tolerated treatment well    Behavior During Therapy  [facility] for tasks assessed/performed       Past Medical History:  Diagnosis Date  . Arthritis   . CAD S/P percutaneous coronary angioplasty    a. s/p NSTEMI 7/17:  EF 55%. pLAD 40%, mLAD 50%, dLCx 20%, OM2 100%,>> PCI: 2.5 x 14 mm Resolute DES to OM2   . GERD (gastroesophageal reflux disease)   . History of non-ST elevation myocardial infarction (NSTEMI)    Noted to have occluded OM 2 treated with DES stent  . Hyperlipidemia 09/17/2015  . Hypertension   . Hypertensive heart disease 09/17/2015  . Myocardial infarction (HCC)    11/2015  . Pneumonia    1998  . PONV (postoperative nausea and vomiting)    After Cath    Past Surgical History:  Procedure Laterality Date  . BACK SURGERY     x2 2003 and 2005  . CARDIAC CATHETERIZATION N/A 09/17/2015   Procedure: Left Heart Cath and Coronary Angiography;  Surgeon: [name], MD;  Location: [facility] INVASIVE CV LAB;  Service: Cardiovascular: EF 55%. pLAD 40%, mLAD 50%, dLCx 20%, OM2 100%,>> PCI: 2.5 x 14 mm Resolute DES to OM2   . CARDIAC CATHETERIZATION N/A 09/17/2015   Procedure: Coronary Stent Intervention;  Surgeon: [name], MD;  Location: [facility] INVASIVE CV LAB;  Service: Cardiovascular:  PCI: 2.5 x 14 mm Resolute DES to OM2   . KNEE ARTHROSCOPY     Left 1994  . ROTATOR CUFF REPAIR  Right   . TOTAL KNEE ARTHROPLASTY Left 02/12/2017   Procedure: LEFT TOTAL KNEE ARTHROPLASTY;  Surgeon: [name], MD;  Location: [facility];  Service: Orthopedics;  Laterality: Left;  120 mins  . TRANSTHORACIC ECHOCARDIOGRAM  09/17/2015   Normal LV size and thickness. EF 50-55%. No regional wall motion abnormality. GR 1 DD.    There were no vitals filed for this visit.  Subjective Assessment - 04/28/17 0738    Subjective  Reports swelling is more prominent at night now but trying to be more active to prepare for going back to work. Has no choice per patient that he has to return to work. Reports feeling as if L knee is locked and tightness along the anterior knee.    Pertinent History  Chronic right knee pain.  MI.  Back surgery. Right RTC repair.    How long can you walk comfortably?  Short distances around home with straight cane.    Patient Stated Goals  Get around without knee pain.    Currently in Pain?  Other (Comment) No pain assessment provided by patient         [facility] PT Assessment - 04/28/17 0001      Assessment   Medical Diagnosis  Left  total knee replacement.    Onset Date/Surgical Date  02/12/17    Next MD Visit  04/2017      Restrictions   Weight Bearing Restrictions  No      ROM / Strength   AROM / PROM / Strength  AROM      AROM   Overall AROM   Deficits    AROM Assessment Site  Knee    Right/Left Knee  Left    Left Knee Extension  -3    Left Knee Flexion  96                  [facility] Adult PT Treatment/Exercise - 04/28/17 0001      Knee/Hip Exercises: Aerobic   Recumbent Bike  Attempted at seat 9 but unable to complete secondary to L knee tightness    Nustep  L6 x9 min      Knee/Hip Exercises: Machines for Strengthening   Cybex Knee Extension  10# 3x10 reps LLE only    Cybex Knee Flexion  30# 3x10 reps LLE only    Cybex Leg Press  3 pl, sesat 7 x30 reps for flexion      Knee/Hip Exercises: Standing   Terminal Knee Extension   Strengthening;Left;20 reps;Limitations    Terminal Knee Extension Limitations  Orange XTS    Wall Squat  20 reps      Knee/Hip Exercises: Prone   Other Prone Exercises  L prone knee flexion stretch x5 min      Manual Therapy   Manual Therapy  Myofascial release;Joint mobilization    Joint Mobilization  L tibiofemoral joint mobilization to assess/improve joint mobility    Soft tissue mobilization  IASTW to L knee, distal quad and ITB to reduce adhesions that may limit ROM                  PT Long Term Goals - 04/17/17 1008      PT LONG TERM GOAL #1   Title  Independent with a HEP.    Time  4    Period  Weeks    Status  Achieved      PT LONG TERM GOAL #2   Title  Active left knee flexion to 115 degrees+ so the patient can perform functional tasks and do so with pain not > 2-3/10.    Time  4    Period  Weeks    Status  On-going 96 degrees L knee flexion AROM      PT LONG TERM GOAL #3   Title  Increase left knee strength to a solid 4+/5 to provide good stability for accomplishment of functional activities.    Period  Weeks    Status  Achieved      PT LONG TERM GOAL #4   Title  Full left active knee extension in order to normalize gait.    Period  Weeks    Status  On-going -3 L knee extension AROM      PT LONG TERM GOAL #5   Title  Perform a reciprocating stair gait with one railing with pain not > 2-3/10.    Time  4    Period  Weeks    Status  On-going            Plan - 04/28/17 0830    Clinical Impression Statement  Patient presented in clinic with reports of increased edema especially at night as he continues to increase activity to prep for return to  work. Patient able to complete exercises as directed but unable to complete full revolutions with seat back secondary to L knee "locked." Patient reported experiencing a sharp pain during wall squat but able to return to exercise. Patient remains diligent regarding HEP and walking at home to prep for return  to work. Patient not experiencing L knee pain as much but reports of L knee "locked" is greatest current complaint. Good L tibiofemoral joint mobility assessed with JM in sitting today. IASTW continued surround the L patella with good redness response and sensitivity in distal L ITB and inferiomedial aspect of L knee. AROM of L knee measured following treatment as 3-96 deg.    Rehab Potential  Good    PT Frequency  3x / week    PT Duration  4 weeks    PT Treatment/Interventions  ADLs/Self Care Home Management;Electrical Stimulation;Cryotherapy;Therapeutic exercise;Therapeutic activities;Stair training;Gait training;Patient/family education;Passive range of motion;Manual techniques;Vasopneumatic Device    PT Next Visit Plan  continue exercises for left knee strengthening and AROM. begin functional activities to prepare for return to work.    Consulted and Agree with Plan of Care  Patient       Patient will benefit from skilled therapeutic intervention in order to improve the following deficits and impairments:  Abnormal gait, Decreased activity tolerance, Decreased range of motion, Increased edema, Pain, Decreased strength  Visit Diagnosis: Stiffness of left knee, not elsewhere classified  Localized edema  Chronic pain of left knee     Problem List Patient Active Problem List   Diagnosis Date Noted  . Primary osteoarthritis of left knee 02/12/2017  . 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 Coronary artery disease involving native coronary artery of native heart with unstable angina pectoris (HCC) 09/25/2015  . Status post coronary artery stent placement   . Hypertensive heart disease 09/17/2015  . Hyperlipidemia with target low density lipoprotein (LDL) cholesterol less than 70 mg/dL 16/10/960407/12/2015  . NSTEMI (non-ST elevated myocardial infarction) [facility](HCC) 09/16/2015    Marvell FullerKelsey P Frederick Marro, PTA 04/28/17 8:42 AM   [facility] [address] [place],  [place], [id number] Phone: [phone]   Fax:  [phone]  Name: Marcello FennelKendall M Leech MRN: [id number] Date of Birth: Dec 29, 1965

## 2017-04-30 ENCOUNTER — Encounter: Payer: Self-pay | Admitting: Gastroenterology

## 2017-05-05 ENCOUNTER — Ambulatory Visit: Payer: BLUE CROSS/BLUE SHIELD | Admitting: Physical Therapy

## 2017-05-05 ENCOUNTER — Encounter: Payer: Self-pay | Admitting: Physical Therapy

## 2017-05-05 DIAGNOSIS — G8929 Other chronic pain: Secondary | ICD-10-CM

## 2017-05-05 DIAGNOSIS — R600 Localized edema: Secondary | ICD-10-CM | POA: Diagnosis not present

## 2017-05-05 DIAGNOSIS — M25562 Pain in left knee: Secondary | ICD-10-CM

## 2017-05-05 DIAGNOSIS — M25662 Stiffness of left knee, not elsewhere classified: Secondary | ICD-10-CM

## 2017-05-05 NOTE — Therapy (Signed)
[facility] [address] [place], [place], [address] Phone: [phone]   Fax:  [phone]  Physical Therapy Treatment  Patient Details  Name: [name] MRN: [id number] Date of Birth: 1965-04-20 Referring Provider: [name] MD   Encounter Date: 05/05/2017  PT End of Session - 05/05/17 0759    Visit Number  23    Number of Visits  25    Date for PT Re-Evaluation  06/15/17    Authorization Type  FOTO at leasr every 5th visit.    PT Start Time  [phone]    PT Stop Time  0815    PT Time Calculation (min)  44 min    Activity Tolerance  Patient tolerated treatment well    Behavior During Therapy  [facility] for tasks assessed/performed       Past Medical History:  Diagnosis Date  . Arthritis   . CAD S/P percutaneous coronary angioplasty    a. s/p NSTEMI 7/17:  EF 55%. pLAD 40%, mLAD 50%, dLCx 20%, OM2 100%,>> PCI: 2.5 x 14 mm Resolute DES to OM2   . GERD (gastroesophageal reflux disease)   . History of non-ST elevation myocardial infarction (NSTEMI)    Noted to have occluded OM 2 treated with DES stent  . Hyperlipidemia 09/17/2015  . Hypertension   . Hypertensive heart disease 09/17/2015  . Myocardial infarction (HCC)    11/2015  . Pneumonia    1998  . PONV (postoperative nausea and vomiting)    After Cath    Past Surgical History:  Procedure Laterality Date  . BACK SURGERY     x2 2003 and 2005  . CARDIAC CATHETERIZATION N/A 09/17/2015   Procedure: Left Heart Cath and Coronary Angiography;  Surgeon: [name], MD;  Location: [facility] INVASIVE CV LAB;  Service: Cardiovascular: EF 55%. pLAD 40%, mLAD 50%, dLCx 20%, OM2 100%,>> PCI: 2.5 x 14 mm Resolute DES to OM2   . CARDIAC CATHETERIZATION N/A 09/17/2015   Procedure: Coronary Stent Intervention;  Surgeon: [name], MD;  Location: [facility] INVASIVE CV LAB;  Service: Cardiovascular:  PCI: 2.5 x 14 mm Resolute DES to OM2   . KNEE ARTHROSCOPY     Left 1994  . ROTATOR CUFF REPAIR  Right   . TOTAL KNEE ARTHROPLASTY Left 02/12/2017   Procedure: LEFT TOTAL KNEE ARTHROPLASTY;  Surgeon: [name], MD;  Location: [facility];  Service: Orthopedics;  Laterality: Left;  120 mins  . TRANSTHORACIC ECHOCARDIOGRAM  09/17/2015   Normal LV size and thickness. EF 50-55%. No regional wall motion abnormality. GR 1 DD.    There were no vitals filed for this visit.  Subjective Assessment - 05/05/17 0757    Subjective  Reports that he has to see MD wednesday as MD had emergency surgery. Reports that he had a good day sunday and then did his exercises and walked 2 mi yesterday and had more pain and swelling. Reports that if he feels as if he has slack space inside knee when he goes down on elliptical.    Pertinent History  Chronic right knee pain.  MI.  Back surgery. Right RTC repair.    How long can you walk comfortably?  Short distances around home with straight cane.    Patient Stated Goals  Get around without knee pain.    Currently in Pain?  No/denies         [facility] PT Assessment - 05/05/17 0001      Assessment   Medical Diagnosis  Left total knee replacement.    Onset Date/Surgical Date  02/12/17    Next MD Visit  04/2017      Restrictions   Weight Bearing Restrictions  No                  [facility] Adult PT Treatment/Exercise - 05/05/17 0001      Knee/Hip Exercises: Aerobic   Elliptical  R5, L1 x5 min    Nustep  L6 x15 min      Knee/Hip Exercises: Machines for Strengthening   Cybex Knee Extension  20# 3x10 reps    Cybex Knee Flexion  40# 3x10 reps    Cybex Leg Press  3 pl, seat 6 x30 reps      Knee/Hip Exercises: Standing   Lateral Step Up  Left;3 sets;10 reps;Hand Hold: 2;Step Height: 6"    Forward Step Up  Left;3 sets;10 reps;Hand Hold: 2;Step Height: 6"    Step Down  Left;3 sets;10 reps;Hand Hold: 2;Step Height: 4"    Walking with Sports Cord  Pink XTS 4D walking x10 reps each                  PT Long Term Goals - 04/17/17 1008      PT  LONG TERM GOAL #1   Title  Independent with a HEP.    Time  4    Period  Weeks    Status  Achieved      PT LONG TERM GOAL #2   Title  Active left knee flexion to 115 degrees+ so the patient can perform functional tasks and do so with pain not > 2-3/10.    Time  4    Period  Weeks    Status  On-going 96 degrees L knee flexion AROM      PT LONG TERM GOAL #3   Title  Increase left knee strength to a solid 4+/5 to provide good stability for accomplishment of functional activities.    Period  Weeks    Status  Achieved      PT LONG TERM GOAL #4   Title  Full left active knee extension in order to normalize gait.    Period  Weeks    Status  On-going -3 L knee extension AROM      PT LONG TERM GOAL #5   Title  Perform a reciprocating stair gait with one railing with pain not > 2-3/10.    Time  4    Period  Weeks    Status  On-going            Plan - 05/05/17 1610    Clinical Impression Statement  Patient tolerated today's treatment well with focus on strengthening for return to work. Patient did well with all directions of step ups and resisted walking. Patient very fatigued by elliptical training in clinic at fast pace per patient discretion. Upon weightbearing on L knee patient reported feeling as if there was a space in his knee.    Rehab Potential  Good    PT Frequency  3x / week    PT Duration  4 weeks    PT Treatment/Interventions  ADLs/Self Care Home Management;Electrical Stimulation;Cryotherapy;Therapeutic exercise;Therapeutic activities;Stair training;Gait training;Patient/family education;Passive range of motion;Manual techniques;Vasopneumatic Device    PT Next Visit Plan  continue exercises for left knee strengthening and AROM. begin functional activities to prepare for return to work.    Consulted and Agree with Plan of Care  Patient  Patient will benefit from skilled therapeutic intervention in order to improve the following deficits and impairments:  Abnormal  gait, Decreased activity tolerance, Decreased range of motion, Increased edema, Pain, Decreased strength  Visit Diagnosis: Stiffness of left knee, not elsewhere classified  Localized edema  Chronic pain of left knee     Problem List Patient Active Problem List   Diagnosis Date Noted  . Primary osteoarthritis of left knee 02/12/2017  . 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 Coronary artery disease involving native coronary artery of native heart with unstable angina pectoris (HCC) 09/25/2015  . Status post coronary artery stent placement   . Hypertensive heart disease 09/17/2015  . Hyperlipidemia with target low density lipoprotein (LDL) cholesterol less than 70 mg/dL 69/62/952807/12/2015  . NSTEMI (non-ST elevated myocardial infarction) [facility](HCC) 09/16/2015    Marvell FullerKelsey P Kennon, PTA 05/05/2017, 8:26 AM  [facility] [address] [place], [place], [address] Phone: [phone]   Fax:  [phone]  Name: Marcello FennelKendall M Koon MRN: [id number] Date of Birth: [birth year]

## 2017-05-06 ENCOUNTER — Encounter: Payer: BLUE CROSS/BLUE SHIELD | Admitting: Physical Therapy

## 2017-05-06 DIAGNOSIS — Z96652 Presence of left artificial knee joint: Secondary | ICD-10-CM | POA: Insufficient documentation

## 2017-05-07 ENCOUNTER — Ambulatory Visit: Payer: BLUE CROSS/BLUE SHIELD | Admitting: Physical Therapy

## 2017-05-07 ENCOUNTER — Other Ambulatory Visit: Payer: Self-pay | Admitting: Cardiovascular Disease

## 2017-05-07 ENCOUNTER — Encounter: Payer: Self-pay | Admitting: Physical Therapy

## 2017-05-07 DIAGNOSIS — M25562 Pain in left knee: Secondary | ICD-10-CM

## 2017-05-07 DIAGNOSIS — M25662 Stiffness of left knee, not elsewhere classified: Secondary | ICD-10-CM

## 2017-05-07 DIAGNOSIS — R600 Localized edema: Secondary | ICD-10-CM | POA: Diagnosis not present

## 2017-05-07 DIAGNOSIS — G8929 Other chronic pain: Secondary | ICD-10-CM

## 2017-05-07 NOTE — Telephone Encounter (Signed)
Please review for refill, Thanks !  

## 2017-05-07 NOTE — Therapy (Addendum)
[facility] [place], [place], [address] Phone: [phone]   Fax:  [phone]  Physical Therapy Treatment  Patient Details  Name: [name] MRN: [id number] Date of Birth: [birth year] Referring Provider: [name] MD   Encounter Date: 05/07/2017  PT End of Session - 05/07/17 0817    Visit Number  24    Number of Visits  25    Date for PT Re-Evaluation  06/15/17    Authorization Type  FOTO at leasr every 5th visit.    PT Start Time  [phone]    PT Stop Time  0815    PT Time Calculation (min)  44 min       Past Medical History:  Diagnosis Date  . Arthritis   . CAD S/P percutaneous coronary angioplasty    a. s/p NSTEMI 7/17:  EF 55%. pLAD 40%, mLAD 50%, dLCx 20%, OM2 100%,>> PCI: 2.5 x 14 mm Resolute DES to OM2   . GERD (gastroesophageal reflux disease)   . History of non-ST elevation myocardial infarction (NSTEMI)    Noted to have occluded OM 2 treated with DES stent  . Hyperlipidemia 09/17/2015  . Hypertension   . Hypertensive heart disease 09/17/2015  . Myocardial infarction ([place])    11/2015  . Pneumonia    1998  . PONV (postoperative nausea and vomiting)    After Cath    Past Surgical History:  Procedure Laterality Date  . BACK SURGERY     x2 2003 and 2005  . CARDIAC CATHETERIZATION N/A 09/17/2015   Procedure: Left Heart Cath and Coronary Angiography;  Surgeon: Troy Sine, MD;  Location: [place] CV LAB;  Service: Cardiovascular: EF 55%. pLAD 40%, mLAD 50%, dLCx 20%, OM2 100%,>> PCI: 2.5 x 14 mm Resolute DES to OM2   . CARDIAC CATHETERIZATION N/A 09/17/2015   Procedure: Coronary Stent Intervention;  Surgeon: Troy Sine, MD;  Location: [facility] CV LAB;  Service: Cardiovascular:  PCI: 2.5 x 14 mm Resolute DES to OM2   . KNEE ARTHROSCOPY     Left 1994  . ROTATOR CUFF REPAIR Right   . TOTAL KNEE ARTHROPLASTY Left 02/12/2017   Procedure: LEFT TOTAL KNEE ARTHROPLASTY;  Surgeon: [name],  MD;  Location: [facility];  Service: Orthopedics;  Laterality: Left;  120 mins  . TRANSTHORACIC ECHOCARDIOGRAM  09/17/2015   Normal LV size and thickness. EF 50-55%. No regional wall motion abnormality. GR 1 DD.    There were no vitals filed for this visit.  Subjective Assessment - 05/07/17 0734    Subjective  Reports that he was given meloxicam for inflammation and after one dose he can feel the difference.    Pertinent History  Chronic right knee pain.  MI.  Back surgery. Right RTC repair.    How long can you walk comfortably?  Short distances around home with straight cane.    Patient Stated Goals  Get around without knee pain.    Currently in Pain?  No/denies         [facility] PT Assessment - 05/07/17 0001      Assessment   Medical Diagnosis  Left total knee replacement.    Onset Date/Surgical Date  02/12/17      Restrictions   Weight Bearing Restrictions  No                  [facility] Adult PT Treatment/Exercise - 05/07/17 0001      Knee/Hip Exercises: Aerobic  Nustep  L6 x20 min      Knee/Hip Exercises: Machines for Strengthening   Cybex Knee Extension  20# 3x10 reps    Cybex Knee Flexion  40# 3x10 reps    Cybex Leg Press  3 pl, seat 6 x30 reps      Knee/Hip Exercises: Standing   Lateral Step Up  Left;3 sets;10 reps;Hand Hold: 2;Step Height: 8"    Forward Step Up  Left;3 sets;10 reps;Hand Hold: 2;Step Height: 8"    Step Down  Left;3 sets;10 reps;Hand Hold: 2;Step Height: 8";Step Height: 4" L heel dot 4" step x20 reps                  PT Long Term Goals - 04/17/17 1008      PT LONG TERM GOAL #1   Title  Independent with a HEP.    Time  4    Period  Weeks    Status  Achieved      PT LONG TERM GOAL #2   Title  Active left knee flexion to 115 degrees+ so the patient can perform functional tasks and do so with pain not > 2-3/10.    Time  4    Period  Weeks    Status  On-going 96 degrees L knee flexion AROM      PT LONG TERM GOAL #3   Title   Increase left knee strength to a solid 4+/5 to provide good stability for accomplishment of functional activities.    Period  Weeks    Status  Achieved      PT LONG TERM GOAL #4   Title  Full left active knee extension in order to normalize gait.    Period  Weeks    Status  On-going -3 L knee extension AROM      PT LONG TERM GOAL #5   Title  Perform a reciprocating stair gait with one railing with pain not > 2-3/10.    Time  4    Period  Weeks    Status  On-going            Plan - 05/07/17 0919    Clinical Impression Statement  Patient tolerated today's treatment well other than complaining of fatigue following treatment. Greater height for step ups today to prepare for return to work as patient is returning to work on Monday. Patient had no visible difficulties with step ups in either of directions completed today and fatigued quickly with heel dots to 4" step. Patient was educated regarding what JAS brace order was and at this time patient was really unsure of the brace now that he will be working and states that even now he is pleased with his ROM.    Rehab Potential  Good    PT Frequency  3x / week    PT Duration  4 weeks    PT Treatment/Interventions  ADLs/Self Care Home Management;Electrical Stimulation;Cryotherapy;Therapeutic exercise;Therapeutic activities;Stair training;Gait training;Patient/family education;Passive range of motion;Manual techniques;Vasopneumatic Device    PT Next Visit Plan  continue exercises for left knee strengthening and AROM. begin functional activities to prepare for return to work.    Consulted and Agree with Plan of Care  Patient       Patient will benefit from skilled therapeutic intervention in order to improve the following deficits and impairments:  Abnormal gait, Decreased activity tolerance, Decreased range of motion, Increased edema, Pain, Decreased strength  Visit Diagnosis: Stiffness of left knee, not elsewhere classified  Localized  edema  Chronic pain of left knee     Problem List Patient Active Problem List   Diagnosis Date Noted  . Primary osteoarthritis of left knee 02/12/2017  . 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 Coronary artery disease involving native coronary artery of native heart with unstable angina pectoris ([place]) 09/25/2015  . Status post coronary artery stent placement   . Hypertensive heart disease 09/17/2015  . Hyperlipidemia with target low density lipoprotein (LDL) cholesterol less than 70 mg/dL 09/17/2015  . NSTEMI (non-ST elevated myocardial infarction) ([place]) 09/16/2015    Standley Brooking, PTA 05/07/2017, 10:01 AM  [facility] [place], [place], [address] Phone: [phone]   Fax:  [phone]  Name: [name] MRN: [id number] Date of Birth: [birth year]  PHYSICAL THERAPY DISCHARGE SUMMARY  Visits from Start of Care: 24.  Current functional level related to goals / functional outcomes: See above.   Remaining deficits: See goal section.   Education / Equipment: HEP. Plan: Patient agrees to discharge.  Patient goals were partially met. Patient is being discharged due to                                                     ?????         Mali Applegate MPT

## 2017-05-08 ENCOUNTER — Encounter: Payer: BLUE CROSS/BLUE SHIELD | Admitting: Physical Therapy

## 2017-05-22 ENCOUNTER — Ambulatory Visit (AMBULATORY_SURGERY_CENTER): Payer: Self-pay | Admitting: *Deleted

## 2017-05-22 ENCOUNTER — Other Ambulatory Visit: Payer: Self-pay

## 2017-05-22 VITALS — Ht 67.0 in | Wt 179.0 lb

## 2017-05-22 DIAGNOSIS — Z1211 Encounter for screening for malignant neoplasm of colon: Secondary | ICD-10-CM

## 2017-05-22 MED ORDER — PEG-KCL-NACL-NASULF-NA ASC-C 140 G PO SOLR: 1.0000 | Freq: Once | ORAL | 0 refills | Status: AC

## 2017-05-22 NOTE — Progress Notes (Signed)
Patient denies any allergies to eggs or soy. Patient denies any problems with anesthesia. Patient vomit after heart cath only.  Patient denies any oxygen use at home. Patient denies taking any diet/weight loss medications or blood thinners. EMMI education assisgned to patient on colonoscopy, this was explained and instructions given to patient. Plenvu sample given to pt lot #[id number]. Pt did not want Suprep, states his wife did that and she vomit it up. He states he does not think he can drink it due to taste.

## 2017-05-25 ENCOUNTER — Telehealth: Payer: Self-pay | Admitting: Cardiovascular Disease

## 2017-05-25 ENCOUNTER — Encounter: Payer: Self-pay | Admitting: Gastroenterology

## 2017-05-25 NOTE — Telephone Encounter (Signed)
New message     *STAT* If patient is at the pharmacy, call can be transferred to refill team.   1. Which medications need to be refilled? (please list name of each medication and dose if known) nitroGLYCERIN (NITROSTAT) 0.4 MG SL tablet  2. Which pharmacy/location (including street and city if local pharmacy) is medication to be sent to?[id number] - [place], [place] - [address]  3. Do they need a 30 day or 90 day supply? 30

## 2017-06-04 ENCOUNTER — Encounter: Payer: Self-pay | Admitting: Gastroenterology

## 2017-06-04 ENCOUNTER — Ambulatory Visit (AMBULATORY_SURGERY_CENTER): Payer: BLUE CROSS/BLUE SHIELD | Admitting: Gastroenterology

## 2017-06-04 ENCOUNTER — Other Ambulatory Visit: Payer: Self-pay

## 2017-06-04 VITALS — BP 116/69 | HR 64 | Temp 99.1°F | Resp 13 | Ht 67.0 in | Wt 179.0 lb

## 2017-06-04 DIAGNOSIS — Z1212 Encounter for screening for malignant neoplasm of rectum: Secondary | ICD-10-CM | POA: Diagnosis not present

## 2017-06-04 DIAGNOSIS — Z1211 Encounter for screening for malignant neoplasm of colon: Secondary | ICD-10-CM

## 2017-06-04 MED ORDER — SODIUM CHLORIDE 0.9 % IV SOLN: 500.0000 mL | Freq: Once | INTRAVENOUS | Status: DC

## 2017-06-04 NOTE — Patient Instructions (Signed)
YOU HAD AN ENDOSCOPIC PROCEDURE TODAY AT THE [facility]:   Refer to the procedure report that was given to you for any specific questions about what was found during the examination.  If the procedure report does not answer your questions, please call your gastroenterologist to clarify.  If you requested that your care partner not be given the details of your procedure findings, then the procedure report has been included in a sealed envelope for you to review at your convenience later.  YOU SHOULD EXPECT: Some feelings of bloating in the abdomen. Passage of more gas than usual.  Walking can help get rid of the air that was put into your GI tract during the procedure and reduce the bloating. If you had a lower endoscopy (such as a colonoscopy or flexible sigmoidoscopy) you may notice spotting of blood in your stool or on the toilet paper. If you underwent a bowel prep for your procedure, you may not have a normal bowel movement for a few days.  Please Note:  You might notice some irritation and congestion in your nose or some drainage.  This is from the oxygen used during your procedure.  There is no need for concern and it should clear up in a day or so.  SYMPTOMS TO REPORT IMMEDIATELY:   Following lower endoscopy (colonoscopy or flexible sigmoidoscopy):  Excessive amounts of blood in the stool  Significant tenderness or worsening of abdominal pains  Swelling of the abdomen that is new, acute  Fever of 100F or higher  Please see handouts on diverticulosis and hemorrhoids.  For urgent or emergent issues, a gastroenterologist can be reached at any hour by calling [phone].   DIET:  We do recommend a small meal at first, but then you may proceed to your regular diet.  Drink plenty of fluids but you should avoid alcoholic beverages for 24 hours.  ACTIVITY:  You should plan to take it easy for the rest of today and you should NOT DRIVE or use heavy machinery until tomorrow  (because of the sedation medicines used during the test).    FOLLOW UP: Our staff will call the number listed on your records the next business day following your procedure to check on you and address any questions or concerns that you may have regarding the information given to you following your procedure. If we do not reach you, we will leave a message.  However, if you are feeling well and you are not experiencing any problems, there is no need to return our call.  We will assume that you have returned to your regular daily activities without incident.  If any biopsies were taken you will be contacted by phone or by letter within the next 1-3 weeks.  Please call us at [phone] if you have not heard about the biopsies in 3 weeks.    SIGNATURES/CONFIDENTIALITY: You and/or your care partner have signed paperwork which will be entered into your electronic medical record.  These signatures attest to the fact that that the information above on your After Visit Summary has been reviewed and is understood.  Full responsibility of the confidentiality of this discharge information lies with you and/or your care-partner.   Thank you for allowing us to provide your healthcare today.

## 2017-06-04 NOTE — Progress Notes (Signed)
To PACU, VSS. Report to [email or web address] 

## 2017-06-04 NOTE — Op Note (Signed)
[facility] Patient Name: [name] Procedure Date: 06/04/2017 9:09 AM MRN: [id number] Endoscopist: Napoleon FormKavitha V. Lyndsay Talamante , MD Age: [age] Referring MD:  Date of Birth: 07/08/1965 Gender: Male Account #: [id number] Procedure:                Colonoscopy Indications:              Screening for colorectal malignant neoplasm, This                            is the patient's first colonoscopy Medicines:                Monitored Anesthesia Care Procedure:                Pre-Anesthesia Assessment:                           - Prior to the procedure, a History and Physical                            was performed, and patient medications and                            allergies were reviewed. The patient's tolerance of                            previous anesthesia was also reviewed. The risks                            and benefits of the procedure and the sedation                            options and risks were discussed with the patient.                            All questions were answered, and informed consent                            was obtained. Prior Anticoagulants: The patient has                            taken no previous anticoagulant or antiplatelet                            agents. ASA Grade Assessment: III - A patient with                            severe systemic disease. After reviewing the risks                            and benefits, the patient was deemed in                            satisfactory condition to undergo the procedure.  After obtaining informed consent, the colonoscope                            was passed under direct vision. Throughout the                            procedure, the patient's blood pressure, pulse, and                            oxygen saturations were monitored continuously. The                            Model CF-HQ190L [id number]) scope was introduced                            through the anus  and advanced to the the cecum,                            identified by appendiceal orifice and ileocecal                            valve. The colonoscopy was performed without                            difficulty. The patient tolerated the procedure                            well. The quality of the bowel preparation was                            excellent. The ileocecal valve, appendiceal                            orifice, and rectum were photographed. Scope In: 9:21:23 AM Scope Out: 9:32:53 AM Scope Withdrawal Time: 0 hours 9 minutes 2 seconds  Total Procedure Duration: 0 hours 11 minutes 30 seconds  Findings:                 The perianal and digital rectal examinations were                            normal.                           A few small-mouthed diverticula were found in the                            sigmoid colon and descending colon.                           Non-bleeding internal hemorrhoids were found during                            retroflexion. The hemorrhoids were small. Complications:            No immediate complications.  Estimated Blood Loss:     Estimated blood loss: none. Impression:               - Mild diverticulosis in the sigmoid colon and in                            the descending colon.                           - Non-bleeding internal hemorrhoids.                           - No specimens collected. Recommendation:           - Patient has a contact number available for                            emergencies. The signs and symptoms of potential                            delayed complications were discussed with the                            patient. Return to normal activities tomorrow.                            Written discharge instructions were provided to the                            patient.                           - Resume previous diet.                           - Continue present medications.                           - Repeat  colonoscopy in 10 years for screening                            purposes. Napoleon Form, MD 06/04/2017 9:37:03 AM This report has been signed electronically.

## 2017-06-05 ENCOUNTER — Telehealth: Payer: Self-pay | Admitting: *Deleted

## 2017-06-05 NOTE — Telephone Encounter (Signed)
  Follow up Call-  Call back number 06/04/2017  Post procedure Call Back phone  # [phone]  Permission to leave phone message Yes  Some recent data might be hidden     Patient questions:  Do you have a fever, pain , or abdominal swelling? No. Pain Score  0 *  Have you tolerated food without any problems? Yes.    Have you been able to return to your normal activities? Yes.    Do you have any questions about your discharge instructions: Diet   No.  Medications  No. Follow up visit  No.  Do you have questions or concerns about your Care? No.  Actions: * If pain score is 4 or above: No action needed, pain <4.

## 2017-06-10 ENCOUNTER — Other Ambulatory Visit: Payer: Self-pay | Admitting: *Deleted

## 2017-06-10 MED ORDER — NITROGLYCERIN 0.4 MG SL SUBL: 0.4000 mg | SUBLINGUAL_TABLET | SUBLINGUAL | 6 refills | Status: DC | PRN

## 2017-06-11 ENCOUNTER — Other Ambulatory Visit: Payer: Self-pay

## 2017-06-11 MED ORDER — NITROGLYCERIN 0.4 MG SL SUBL: 0.4000 mg | SUBLINGUAL_TABLET | SUBLINGUAL | 2 refills | Status: DC | PRN

## 2017-08-04 ENCOUNTER — Other Ambulatory Visit: Payer: Self-pay | Admitting: Cardiovascular Disease

## 2017-08-04 NOTE — Telephone Encounter (Signed)
Rx sent to pharmacy   

## 2017-08-16 IMAGING — DX DG CHEST 2V
2 series · 2 of 2 positions shown · non-contrast
Comparison: None.

CLINICAL DATA: Acute onset of mid to right-sided chest pain and
left shoulder and arm pain. Initial encounter.

EXAM:
CHEST  2 VIEW

[chest pa]
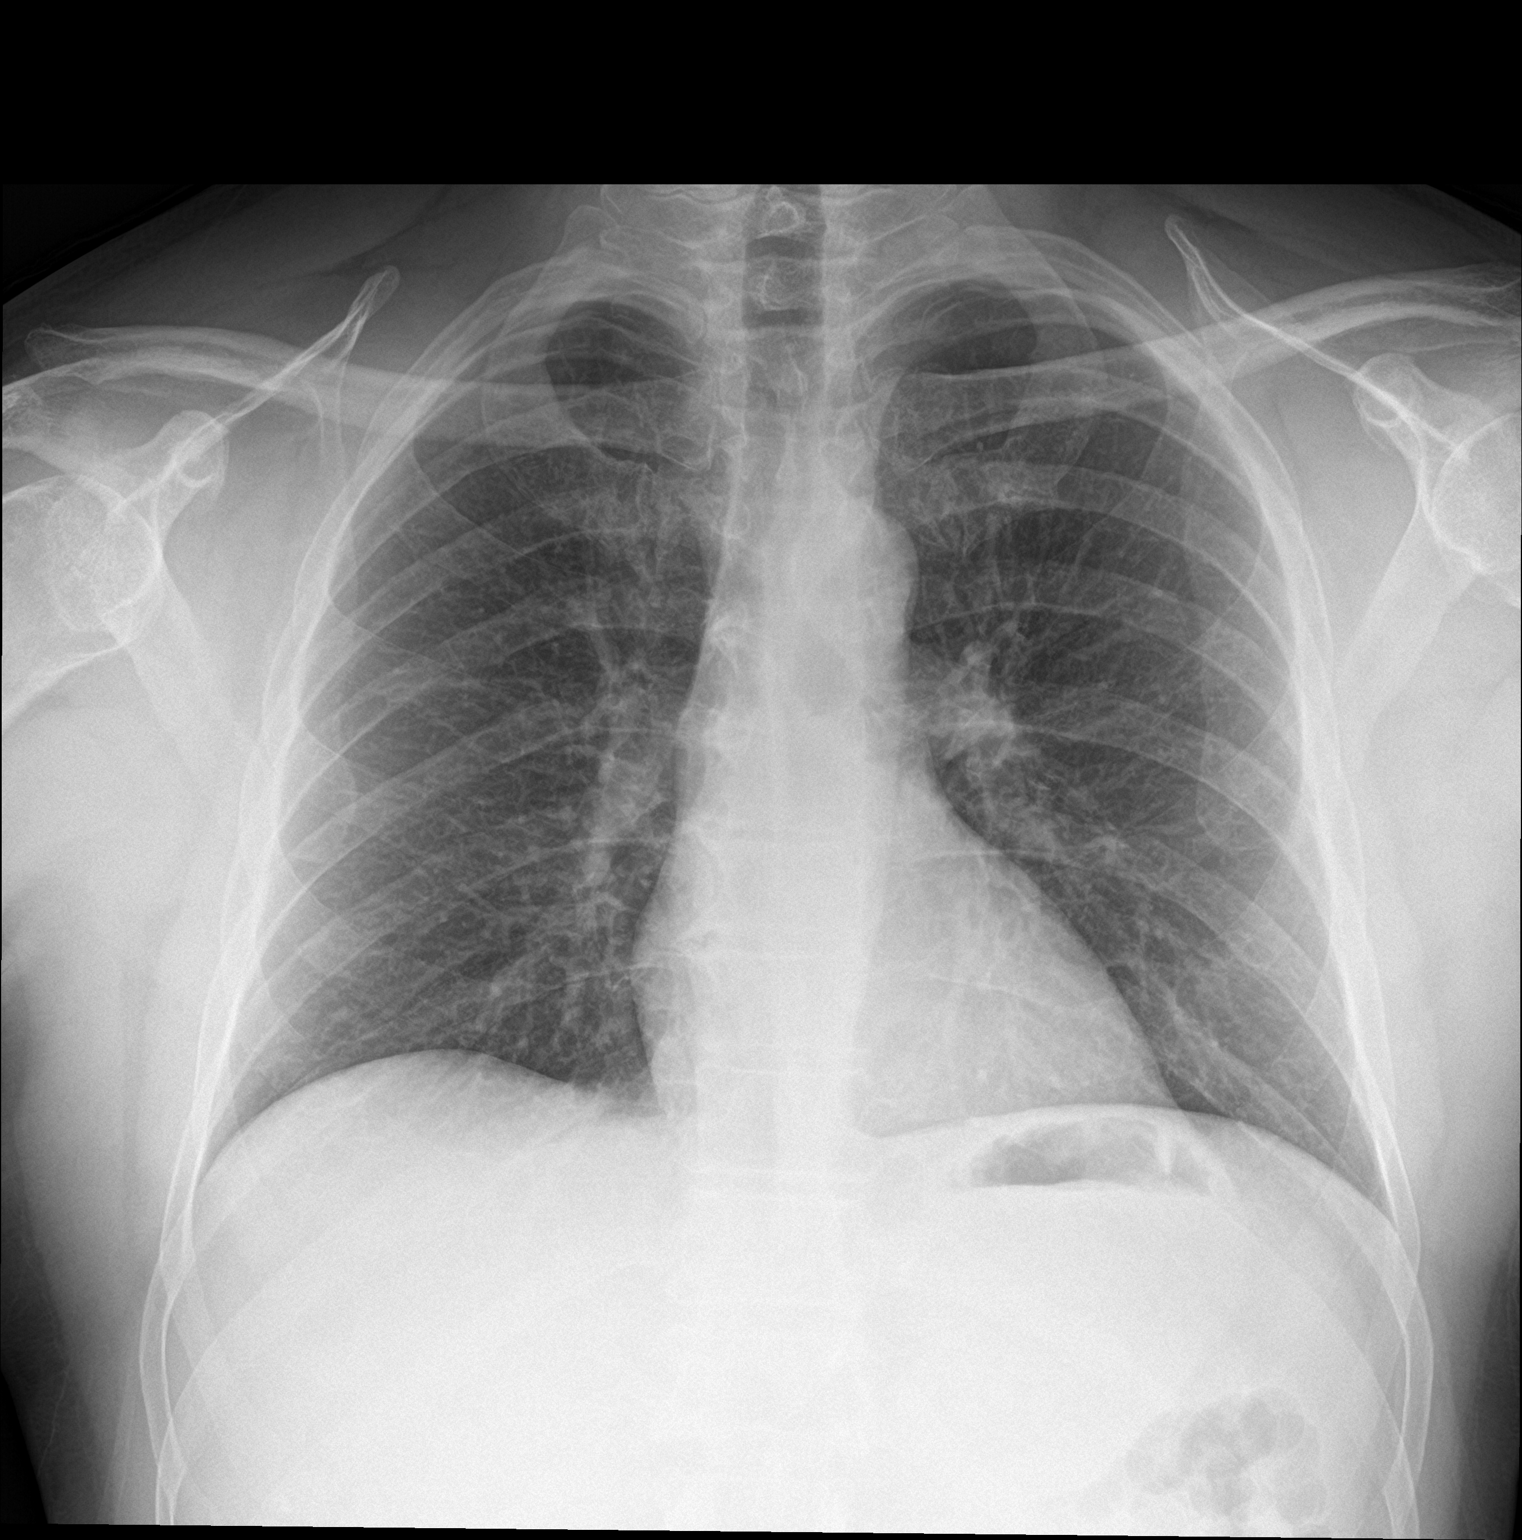

[chest lat]
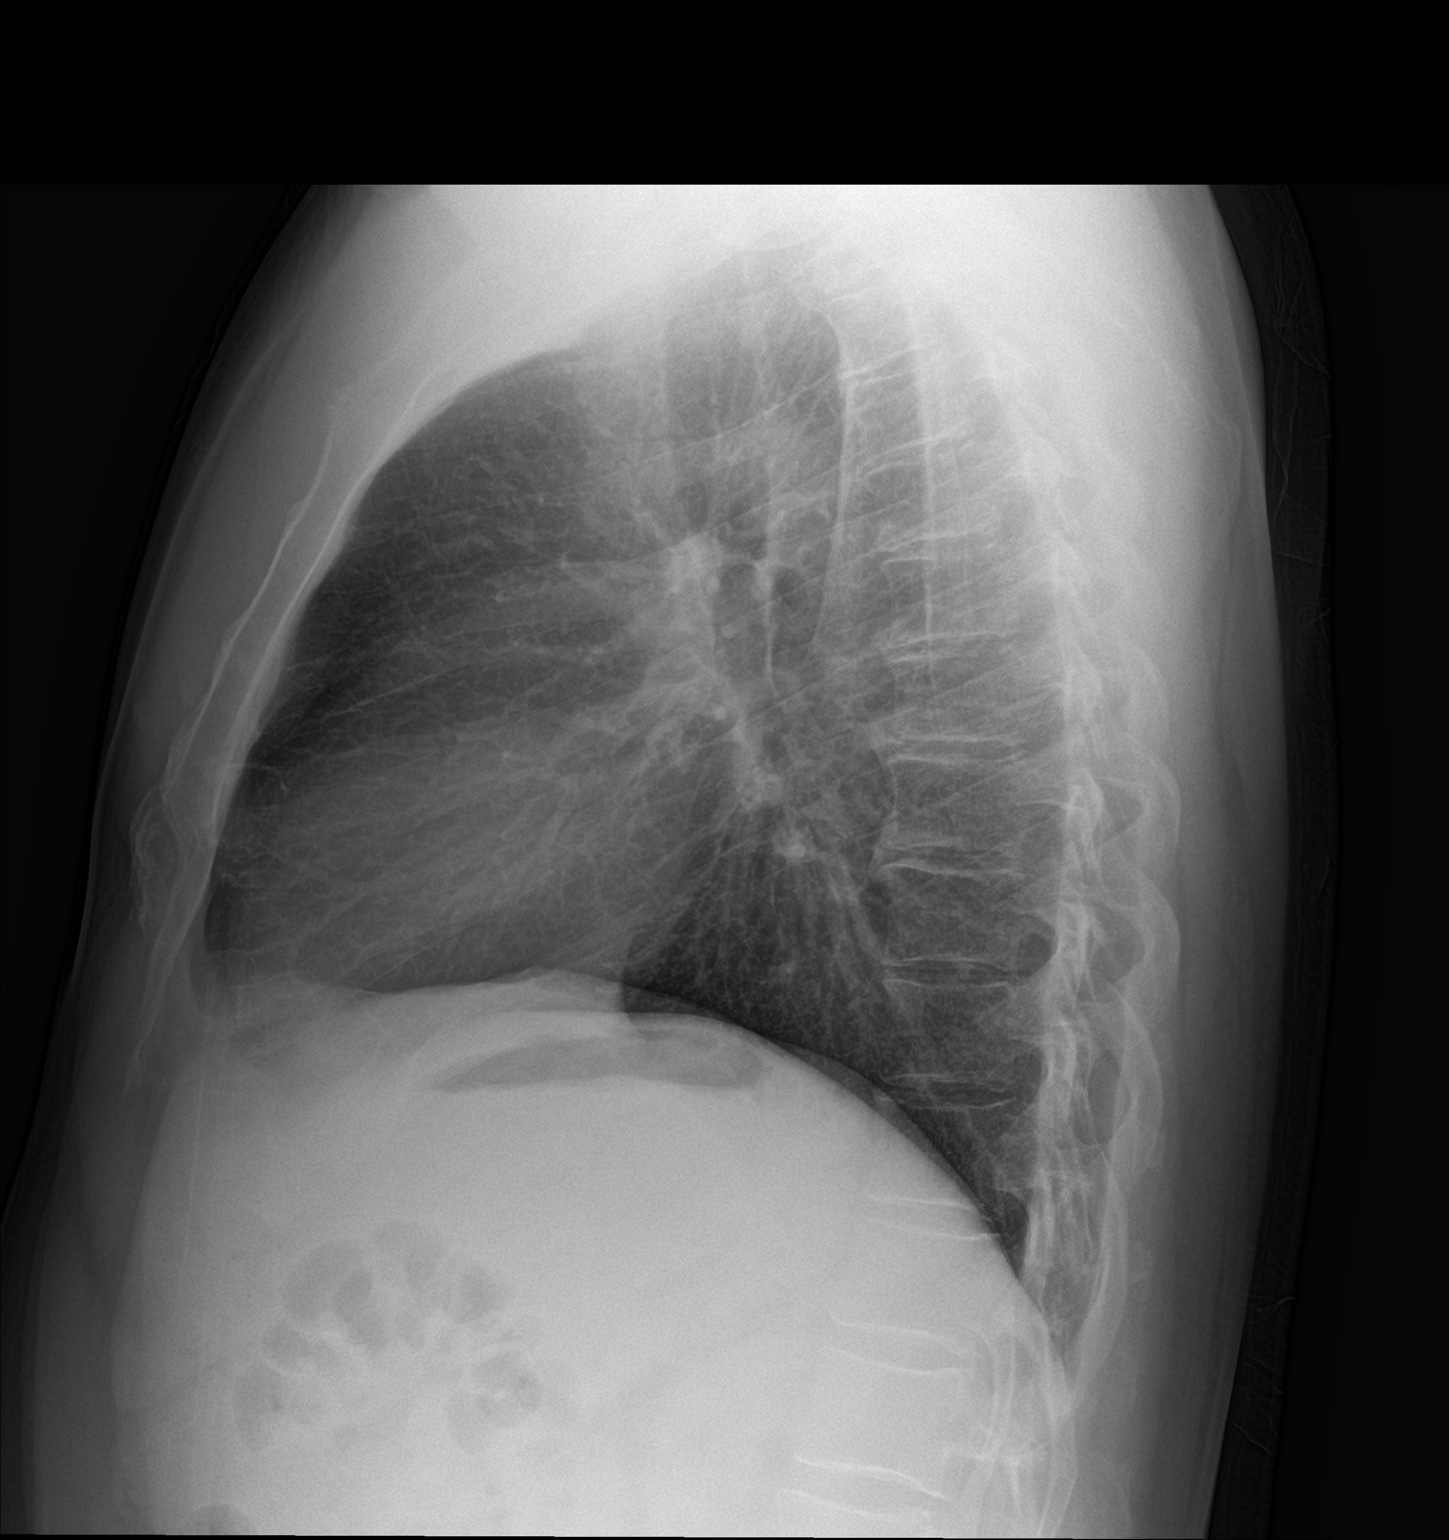

[2 of 2 positions shown; findings below may reference images not displayed]

FINDINGS: The lungs are well-aerated. Pulmonary vascularity is at the upper
limits of normal. There is no evidence of focal opacification,
pleural effusion or pneumothorax.

The heart is normal in size; the mediastinal contour is within
normal limits. No acute osseous abnormalities are seen.
IMPRESSION: No acute cardiopulmonary process seen.

## 2017-10-02 DIAGNOSIS — M5126 Other intervertebral disc displacement, lumbar region: Secondary | ICD-10-CM | POA: Insufficient documentation

## 2017-10-14 ENCOUNTER — Ambulatory Visit (INDEPENDENT_AMBULATORY_CARE_PROVIDER_SITE_OTHER): Payer: BLUE CROSS/BLUE SHIELD | Admitting: Cardiovascular Disease

## 2017-10-14 ENCOUNTER — Encounter: Payer: Self-pay | Admitting: Cardiovascular Disease

## 2017-10-14 VITALS — BP 124/76 | HR 70 | Ht 68.0 in | Wt 178.0 lb

## 2017-10-14 DIAGNOSIS — I25110 Atherosclerotic heart disease of native coronary artery with unstable angina pectoris: Secondary | ICD-10-CM

## 2017-10-14 DIAGNOSIS — I119 Hypertensive heart disease without heart failure: Secondary | ICD-10-CM

## 2017-10-14 DIAGNOSIS — E785 Hyperlipidemia, unspecified: Secondary | ICD-10-CM | POA: Diagnosis not present

## 2017-10-14 DIAGNOSIS — I959 Hypotension, unspecified: Secondary | ICD-10-CM | POA: Diagnosis not present

## 2017-10-14 NOTE — Progress Notes (Signed)
Cardiology Office Note   Date:  10/14/2017   ID:  [name], DOB 10/07/1965, MRN [id number]    PCP:  [name], MD  Cardiologist:   [name], MD   Chief Complaint  Patient presents with  . Follow-up    1 year.  . Shortness of Breath  . Headache  . Edema    Legs.     History of Present Illness: [name] is a 52 y.o. male with CAD s/p DES to OM2, hyperlipidemia who presents for follow up.  [name] was admitted 09/2015 with NSTEMI and found to have an occluded OM2.  He had a DES placed and had residual 50% LAD and 20% LCx lesions.  He was started on metoprolol, ticagrelor, aspirin and atorvastatin.  LVEF was 50-55%.  After being discharged he had some bradycardia and dizziness, so metoprolol was switched to carvedilol. Since making that change the dizziness has improved.  At follow up he complained of fatigue and was switched to nebivolol.  However this was stopped due to dizziness and fatigue.  He Dr. [name] in clinic 04/04/16 due to chest pain.  He was started on metoprolol and Imdur and referred for exercise Myoview 05/2016 revealed LVEF 55% and no ischemia. He was unable to tolerate Imdur 2/2 headaches.  [name] has been doing well.  He has an episode of shortness of breath last month that occurred when sitting in the car.  His heart felt like it was fluttering like a butterfly.  It lasted for an hour.  There was no associated chest pain.  He is very physically active at baseline and walks at work most of the day.  He also walks for exercise twice per week.  He lifts weights as well.  He has not had any exertional chest pain or shortness of breath.  He has some swelling in his left leg since his knee surgery but denies any orthopnea or PND.  He recently had lipids checked with his PCP.   Past Medical History:  Diagnosis Date  . Arthritis   . CAD S/P percutaneous coronary angioplasty    a. s/p NSTEMI 7/17:  EF 55%. pLAD 40%, mLAD 50%, dLCx 20%, OM2  100%,>> PCI: 2.5 x 14 mm Resolute DES to OM2   . GERD (gastroesophageal reflux disease)   . History of non-ST elevation myocardial infarction (NSTEMI)    Noted to have occluded OM 2 treated with DES stent  . Hyperlipidemia 09/17/2015  . Hypertension   . Hypertensive heart disease 09/17/2015  . Myocardial infarction (HCC)    09/2015  . Pneumonia    1998  . PONV (postoperative nausea and vomiting)    After Cath    Past Surgical History:  Procedure Laterality Date  . BACK SURGERY     x2 2003 and 2005  . CARDIAC CATHETERIZATION N/A 09/17/2015   Procedure: Left Heart Cath and Coronary Angiography;  Surgeon: [name], MD;  Location: [facility] INVASIVE CV LAB;  Service: Cardiovascular: EF 55%. pLAD 40%, mLAD 50%, dLCx 20%, OM2 100%,>> PCI: 2.5 x 14 mm Resolute DES to OM2   . CARDIAC CATHETERIZATION N/A 09/17/2015   Procedure: Coronary Stent Intervention;  Surgeon: [name], MD;  Location: [facility] INVASIVE CV LAB;  Service: Cardiovascular:  PCI: 2.5 x 14 mm Resolute DES to OM2   . KNEE ARTHROSCOPY     Left 1994  . ROTATOR CUFF REPAIR Right   . TOTAL KNEE ARTHROPLASTY Left 02/12/2017  Procedure: LEFT TOTAL KNEE ARTHROPLASTY;  Surgeon: [name], Jeffrey, MD;  Location: [facility] ORS;  Service: Orthopedics;  Laterality: Left;  120 mins  . TRANSTHORACIC ECHOCARDIOGRAM  09/17/2015   Normal LV size and thickness. EF 50-55%. No regional wall motion abnormality. GR 1 DD.     Current Outpatient Medications  Medication Sig Dispense Refill  . Ascorbic Acid (VITAMIN C PO) Take 125 mg by mouth daily.    Marland Kitchen. aspirin EC 81 MG tablet Take 81 mg by mouth daily.    Marland Kitchen. atorvastatin (LIPITOR) 80 MG tablet Take 1 tablet (80 mg total) by mouth daily at 6 PM. Keep appointment as scheduled 30 tablet 3  . meloxicam (MOBIC) 7.5 MG tablet Take 1 tablet by mouth as needed.    . metoprolol succinate (TOPROL-XL) 25 MG 24 hr tablet 1/2 TABLET BY MOUTH DAILY 45 tablet 3  . Multiple Vitamin (MULTIVITAMIN WITH MINERALS) TABS tablet  Take 1 tablet by mouth daily.    . nitroGLYCERIN (NITROSTAT) 0.4 MG SL tablet Place 1 tablet (0.4 mg total) under the tongue as needed. 25 tablet 2  . oxyCODONE (OXY IR/ROXICODONE) 5 MG immediate release tablet Take 1-2 tablets (5-10 mg total) by mouth every 4 (four) hours as needed for moderate pain or severe pain ((score 4 to 6)). 40 tablet 0  . Triamcinolone Acetonide (NASACORT ALLERGY 24HR NA) Place 1 spray into the nose daily as needed (allergies).     Current Facility-Administered Medications  Medication Dose Route Frequency Provider Last Rate Last Dose  . 0.9 %  sodium chloride infusion  500 mL Intravenous Once Nandigam, Eleonore ChiquitoKavitha V, MD        Allergies:   Patient has no known allergies.    Social History:  The patient  reports that he has never smoked. He has never used smokeless tobacco. He reports that he does not drink alcohol or use drugs.   Family History:  The patient's family history includes Heart attack in his brother and father.    ROS:  Please see the history of present illness.   Otherwise, review of systems are positive for fatigue.   All other systems are reviewed and negative.    PHYSICAL EXAM: VS:  BP 124/76 (BP Location: Left Arm, Patient Position: Sitting, Cuff Size: Normal)   Pulse 70   Ht 5\' 8"  (1.727 m)   Wt 178 lb (80.7 kg)   BMI 27.06 kg/m  , BMI Body mass index is 27.06 kg/m. GENERAL:  Well appearing HEENT: Pupils equal round and reactive, fundi not visualized, oral mucosa unremarkable NECK:  No jugular venous distention, waveform within normal limits, carotid upstroke brisk and symmetric, no bruits LUNGS:  Clear to auscultation bilaterally HEART:  RRR.  PMI not displaced or sustained,S1 and S2 within normal limits, no S3, no S4, no clicks, no rubs, no murmurs ABD:  Flat, positive bowel sounds normal in frequency in pitch, no bruits, no rebound, no guarding, no midline pulsatile mass, no hepatomegaly, no splenomegaly EXT:  2 plus pulses throughout, no  edema, no cyanosis no clubbing.  Mild LLE edema SKIN:  No rashes no nodules NEURO:  Cranial nerves II through XII grossly intact, motor grossly intact throughout PSYCH:  Cognitively intact, oriented to person place and time   EKG:  EKG is ordered today. The ekg ordered 11/30/15 demonstrateSinus bradycardia. 57 bpm. 10/14/17: Sinus rhythm.  Rate 70 bpm.  Prior inferior infarct.   Exercise Myoveiw 05/09/16: The left ventricular ejection fraction is normal (55-65%).  Nuclear stress  EF: 55%.  There was no ST segment deviation noted during stress.  The study is normal.  This is a low risk study.   Normal stress nuclear study with no ischemia or infarction; EF 55 with normal wall motion. 09/18/15:    Prox LAD to Mid LAD lesion, 40% stenosed.  Mid LAD to Dist LAD lesion, 50% stenosed.  2nd Mrg lesion, 100% stenosed. Post intervention, there is a 0% residual stenosis.  The left ventricular systolic function is normal.  Dist Cx lesion, 20% stenosed.  Transthoracic Echocardiography 09/17/15 Study Conclusions  - Left ventricle: The cavity size was normal. Wall thickness was  normal. Systolic function was normal. The estimated ejection  fraction was in the range of 50% to 55%. Wall motion was normal;  there were no regional wall motion abnormalities. Doppler  parameters are consistent with abnormal left ventricular  relaxation (grade 1 diastolic dysfunction).  Recent Labs: 02/13/2017: BUN 15; Creatinine, Ser 1.02; Potassium 4.1; Sodium 138 02/14/2017: Hemoglobin 11.3; Platelets 242   02/16/16: Sodium 143, potassium 4.6, BUN 18, creatinine 0.93 WBC 5.8, hemoglobin 15.1, hematocrit 45.4, platelets 300 Total cholesterol 129, trig glycerides 55, HDL 51, HDL 67 AST 20, ALT 24 TSH 4.54  Lipid Panel    Component Value Date/Time   CHOL 105 (L) 10/09/2015 0742   TRIG 87 10/09/2015 0742   HDL 37 (L) 10/09/2015 0742   CHOLHDL 2.8 10/09/2015 0742   VLDL 17 10/09/2015 0742    LDLCALC 51 10/09/2015 0742      Wt Readings from Last 3 Encounters:  10/14/17 178 lb (80.7 kg)  06/04/17 179 lb (81.2 kg)  05/22/17 179 lb (81.2 kg)      ASSESSMENT AND PLAN:  # Palpitations: # Shortness of breath: [name] had one isolated episode of palpitations and shortness of breath.  This occurred at rest.  He has no exertional symptoms.  It is hard to know what the rhythm was without anynon record of the event.  It is occurred so infrequently that wearing a monitor would not be helpful.  I recommended that if he has a recurrent event he either go to a fire station, and urgent care, or get the [employer] EKG app on his phone.  # CAD s/p NSTEMI: [name] is s/p DES in OM2 09/2015.  He continues to do well.  He has not expands any chest pain or shortness of breath.  Continue aspirin, atorvastatin, and metoprolol.  We will get a copy of his most recent lipids from his PCP.  His LDL is to be less than 70.   Current medicines are reviewed at length with the patient today.  The patient does not have concerns regarding medicines.  The following changes have been made: none  Labs/ tests ordered today include:  No orders of the defined types were placed in this encounter.    Disposition:   FU with Dariella Gillihan C. Duke Salvia, MD, [facility] in 1 year   Signed, Shirly Bartosiewicz C. Duke Salvia, MD, [facility]  10/14/2017 4:06 PM     [facility]

## 2017-10-14 NOTE — Patient Instructions (Signed)
Medication Instructions:  Your physician recommends that you continue on your current medications as directed. Please refer to the Current Medication list given to you today.  Labwork: WILL GET LABS FROM YOUR PCP  Testing/Procedures: NONE  Follow-Up: Your physician wants you to follow-up in: 1 YEAR  You will receive a reminder letter in the mail two months in advance. If you don't receive a letter, please call our office to schedule the follow-up appointment.  Any Other Special Instructions Will Be Listed Below (If Applicable). CONSIDER GETTING [employer] APP   If you need a refill on your cardiac medications before your next appointment, please call your pharmacy.

## 2017-10-27 ENCOUNTER — Telehealth: Payer: Self-pay

## 2017-10-27 DIAGNOSIS — E785 Hyperlipidemia, unspecified: Secondary | ICD-10-CM

## 2017-10-27 NOTE — Telephone Encounter (Signed)
Notes recorded by Chilton Siandolph, Tiffany, MD on 10/27/2017 at 5:46 AM EDT Labs reviewed. LDL cholesterol needs to be <70 and it was 89. Continue atorvastatin and add Zetia 10mg . Check lipids and CMP in 6-8 weeks

## 2017-11-02 ENCOUNTER — Other Ambulatory Visit: Payer: Self-pay | Admitting: Cardiovascular Disease

## 2018-05-06 ENCOUNTER — Other Ambulatory Visit: Payer: Self-pay | Admitting: Cardiovascular Disease

## 2018-05-06 NOTE — Telephone Encounter (Signed)
Rx(s) sent to pharmacy electronically.  

## 2018-10-29 ENCOUNTER — Other Ambulatory Visit: Payer: Self-pay | Admitting: Cardiovascular Disease

## 2018-11-04 ENCOUNTER — Other Ambulatory Visit: Payer: Self-pay | Admitting: Cardiovascular Disease

## 2018-11-27 ENCOUNTER — Other Ambulatory Visit: Payer: Self-pay | Admitting: Cardiovascular Disease

## 2018-12-24 ENCOUNTER — Other Ambulatory Visit: Payer: Self-pay | Admitting: Cardiovascular Disease

## 2019-01-11 IMAGING — DX DG KNEE 1-2V*L*
2 series · 2 of 2 positions shown · non-contrast
Comparison: None in PACs

CLINICAL DATA: Preoperative examination prior to left knee
replacement.

EXAM:
LEFT KNEE - 1-2 VIEW

[knee ap]
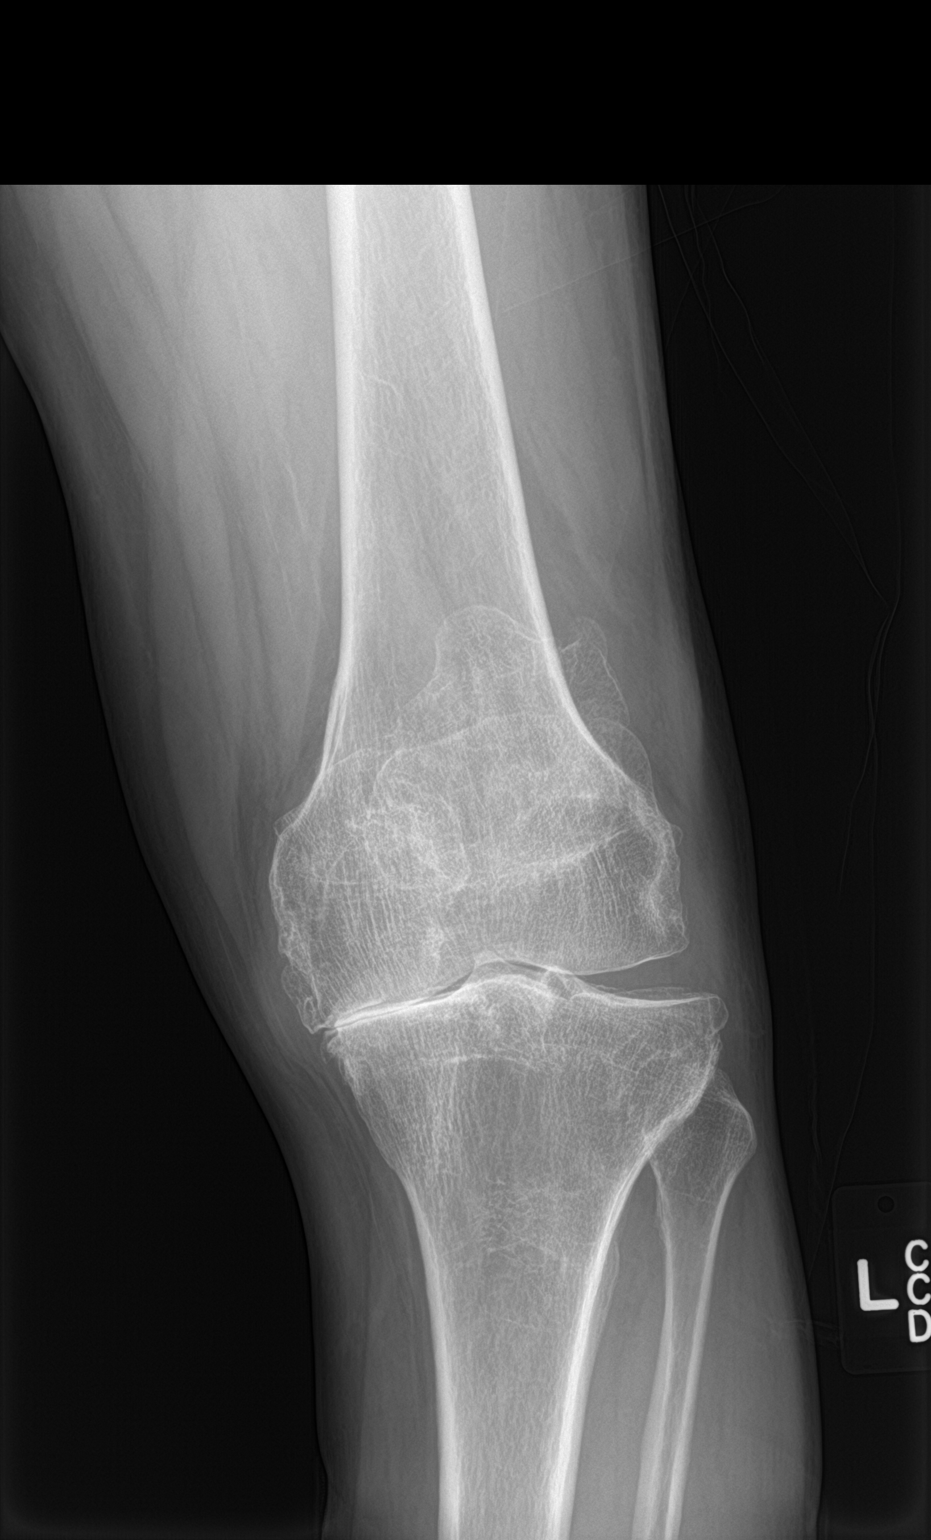

[knee lat]
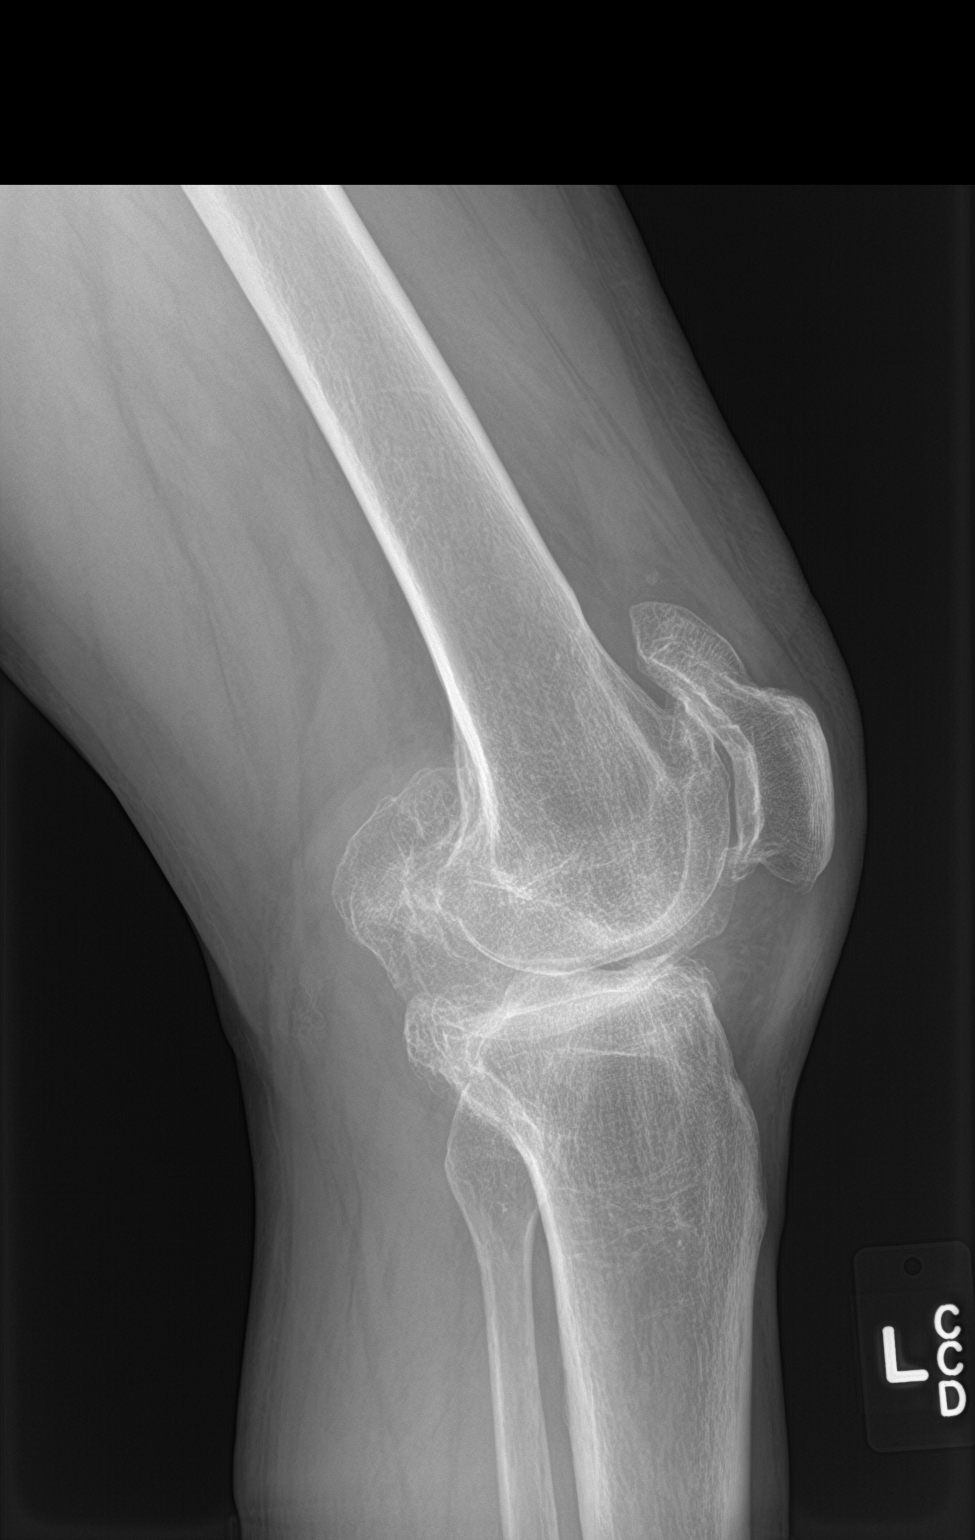

[2 of 2 positions shown; findings below may reference images not displayed]

FINDINGS: Standing AP and lateral views of the left knee reveal the bones to
be subjectively adequately mineralized. There is no acute fracture
or dislocation. There is exuberant heterotopic bone present along
the posterior aspect of the joint space and from the superior margin
of the patella. There is severe narrowing of the medial joint
compartment with milder narrowing of the patellofemoral compartment.
The lateral compartment space is preserved.
IMPRESSION: Severe degenerative joint space loss medially with moderate joint
space loss of the patellofemoral compartment. Exuberant heterotopic
bone formation associated with the posterior aspect of the joint
space and with the superior pole of the patella.

## 2019-01-13 IMAGING — DX DG KNEE 1-2V PORT*L*
2 series · 2 of 2 positions shown · non-contrast
Comparison: 02/10/2017.

CLINICAL DATA: Postoperative total left knee replacement.

EXAM:
PORTABLE LEFT KNEE - 1-2 VIEW

[knee lat]
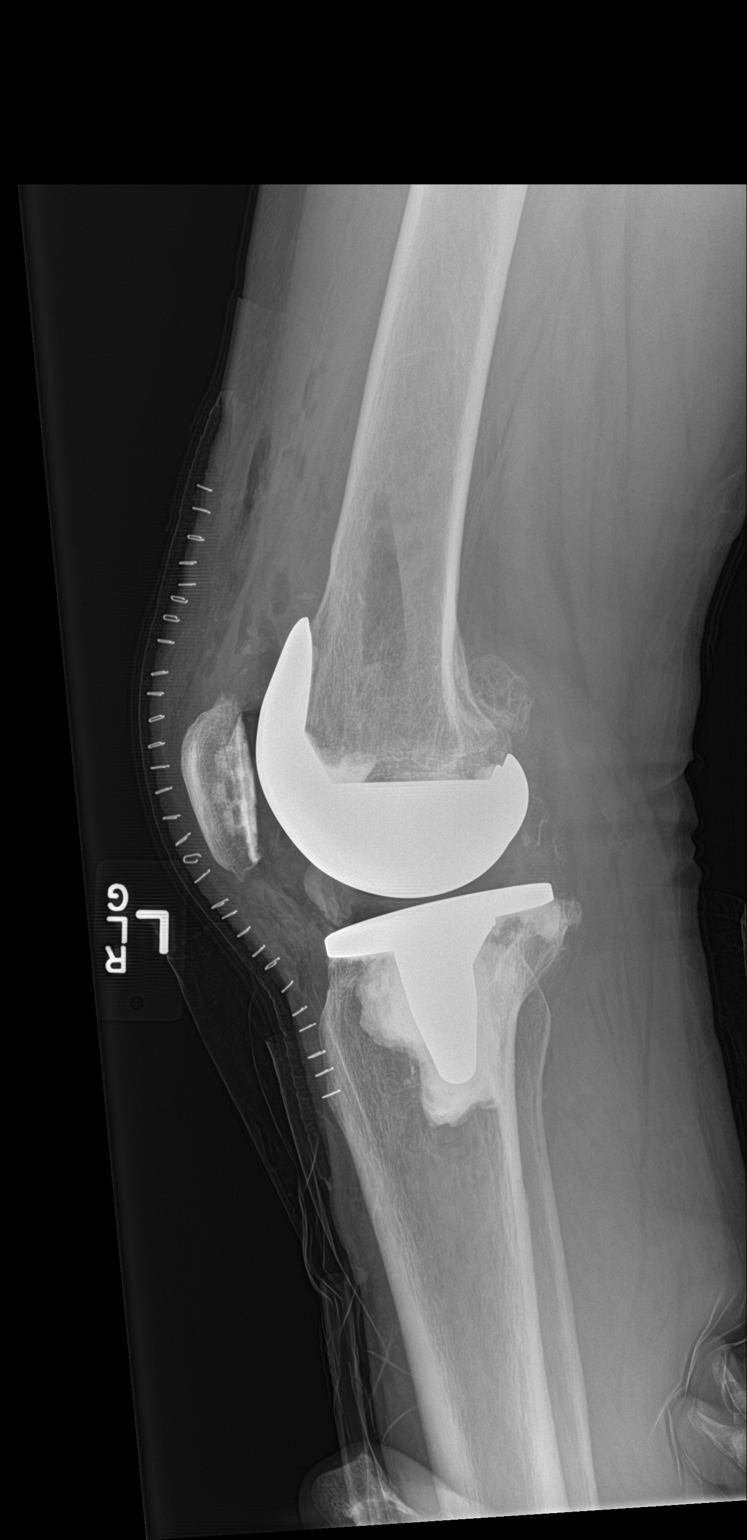

[knee ap]
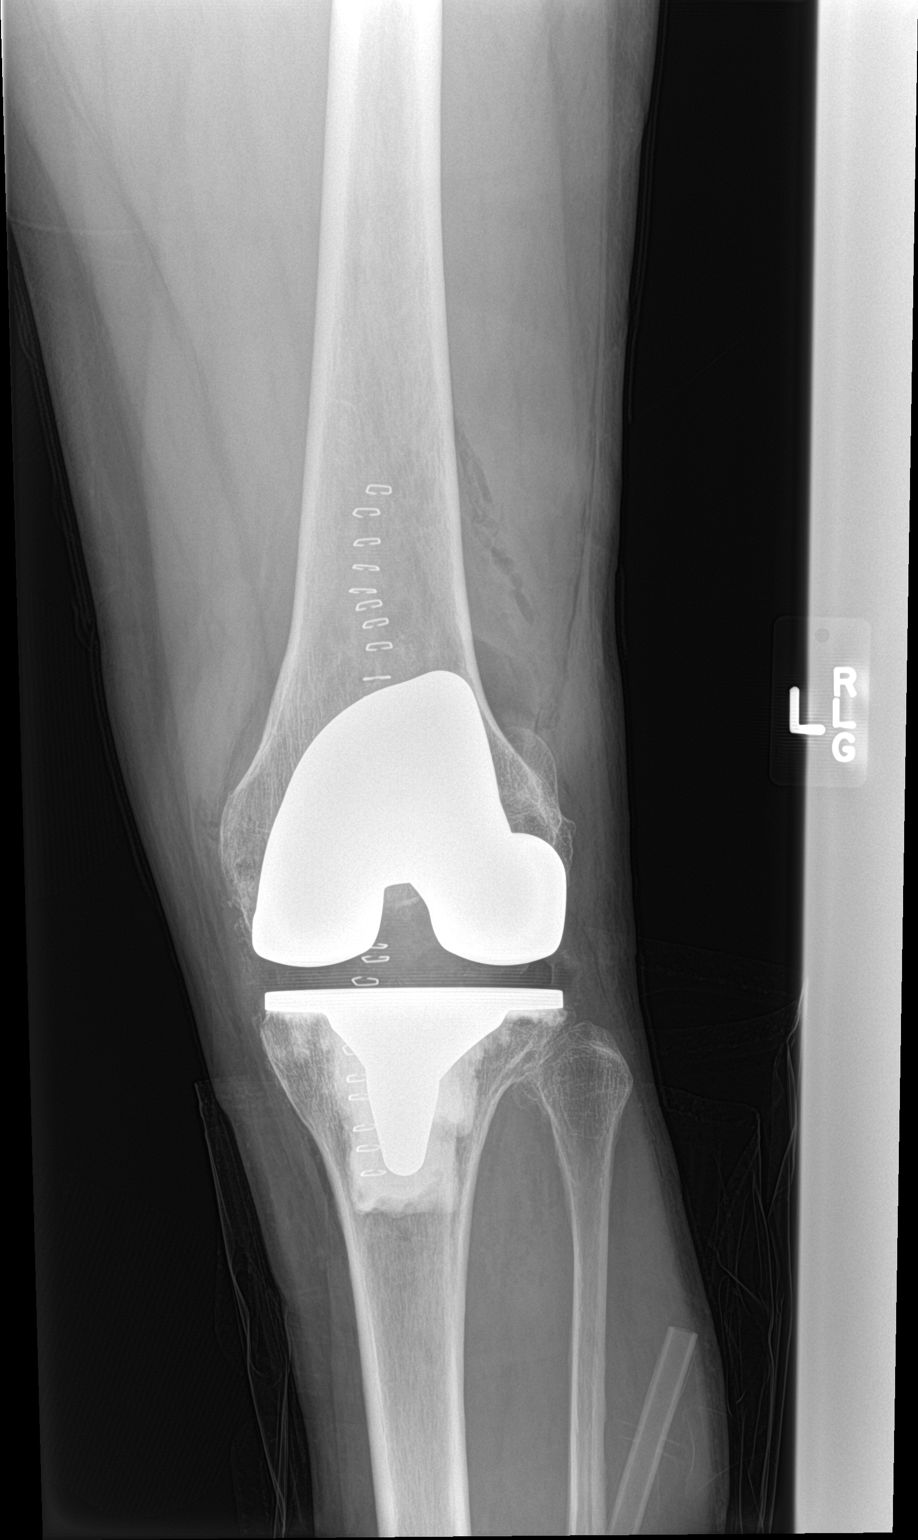

[2 of 2 positions shown; findings below may reference images not displayed]

FINDINGS: Total left knee replacement. Hardware intact. Anatomic alignment. No
acute bony abnormality.
IMPRESSION: Total left knee replacement. Anatomic alignment. No acute
abnormality.

## 2019-01-21 ENCOUNTER — Other Ambulatory Visit: Payer: Self-pay | Admitting: Cardiovascular Disease

## 2019-01-26 ENCOUNTER — Other Ambulatory Visit: Payer: Self-pay | Admitting: Cardiovascular Disease

## 2019-02-01 ENCOUNTER — Other Ambulatory Visit: Payer: Self-pay | Admitting: Cardiovascular Disease

## 2019-02-01 MED ORDER — METOPROLOL SUCCINATE ER 25 MG PO TB24: 12.5000 mg | ORAL_TABLET | Freq: Every day | ORAL | 1 refills | Status: DC

## 2019-02-01 NOTE — Telephone Encounter (Signed)
° ° ° °*  STAT* If patient is at the pharmacy, call can be transferred to refill team.   1. Which medications need to be refilled? (please list name of each medication and dose if known) metoprolol succinate (TOPROL-XL) 25 MG 24 hr tablet 2. Which pharmacy/location (including street and city if local pharmacy) is medication to be sent to? [facility]  3. Do they need a 30 day or 90 day supply? [place]

## 2019-03-23 ENCOUNTER — Other Ambulatory Visit: Payer: Self-pay | Admitting: Cardiovascular Disease

## 2019-03-24 ENCOUNTER — Ambulatory Visit (INDEPENDENT_AMBULATORY_CARE_PROVIDER_SITE_OTHER): Payer: 59 | Admitting: Cardiovascular Disease

## 2019-03-24 ENCOUNTER — Other Ambulatory Visit: Payer: Self-pay

## 2019-03-24 ENCOUNTER — Encounter: Payer: Self-pay | Admitting: Cardiovascular Disease

## 2019-03-24 VITALS — BP 134/90 | HR 64 | Ht 67.0 in | Wt 182.2 lb

## 2019-03-24 DIAGNOSIS — E7800 Pure hypercholesterolemia, unspecified: Secondary | ICD-10-CM | POA: Diagnosis not present

## 2019-03-24 DIAGNOSIS — I119 Hypertensive heart disease without heart failure: Secondary | ICD-10-CM

## 2019-03-24 DIAGNOSIS — I2510 Atherosclerotic heart disease of native coronary artery without angina pectoris: Secondary | ICD-10-CM

## 2019-03-24 DIAGNOSIS — R002 Palpitations: Secondary | ICD-10-CM | POA: Diagnosis not present

## 2019-03-24 MED ORDER — NITROGLYCERIN 0.4 MG SL SUBL: 0.4000 mg | SUBLINGUAL_TABLET | SUBLINGUAL | 99 refills | Status: DC | PRN

## 2019-03-24 NOTE — Patient Instructions (Addendum)
Medication Instructions:  Your physician recommends that you continue on your current medications as directed. Please refer to the Current Medication list given to you today.  *If you need a refill on your cardiac medications before your next appointment, please call your pharmacy*  Lab Work: NONE  Testing/Procedures: NONE   Follow-Up: At [facility], you and your health needs are our priority.  As part of our continuing mission to provide you with exceptional heart care, we have created designated Provider Care Teams.  These Care Teams include your primary Cardiologist (physician) and Advanced Practice Providers (APPs -  Physician Assistants and Nurse Practitioners) who all work together to provide you with the care you need, when you need it.  Your next appointment:   2 month(s)  The format for your next appointment:   Virtual Visit   Provider:   You may see DR [facility]  or one of the following Advanced Practice Providers on your designated Care Team:    Corine Shelter, PA-C  [place], [place]  Edd Fabian, [place]  Other Instructions  MONITOR AND LOG YOUR BLOOD PRESSURE TWICE A DAY TO HAVE AT FOLLOW UP VISIT

## 2019-03-24 NOTE — Progress Notes (Signed)
Cardiology Office Note   Date:  03/24/2019   ID:  [name], DOB 1965/08/27, MRN [id number]    PCP:  Monico Blitz, MD  Cardiologist:   Skeet Latch, MD   No chief complaint on file.    History of Present Illness: [name] is a 54 y.o. male with CAD s/p DES to OM2, and hyperlipidemia who presents for follow up.  [name] was admitted 09/2015 with NSTEMI and found to have an occluded OM2.  [name] had a DES placed and had residual 50% LAD and 20% LCx lesions.  [name] was started on metoprolol, ticagrelor, aspirin and atorvastatin.  LVEF was 50-55%.  After being discharged [name] had some bradycardia and dizziness, so metoprolol was switched to carvedilol.  After making that change the dizziness improved.  At follow up [name] complained of fatigue and was switched to nebivolol.  However this was stopped due to dizziness and fatigue.  [name] Dr. Ellyn Hack in clinic 04/04/16 due to chest pain.  [name] was started back on metoprolol and Imdur and referred for exercise Myoview 05/2016 revealed LVEF 55% and no ischemia. [name] was unable to tolerate Imdur 2/2 headaches.  [name] has been doing well.  [name] is unable to exercise as much as [name] would like due to work.  [name] likes to go on hikes and has no exertional chest pain or shortness of breath.  When [name] checks his blood pressure at home is around 130/80.  [name] has occasional sharp chest pains.  This always occurs at rest and never with exertion.  [name] denies any shortness of breath, lower extremity edema, orthopnea, or PND.  Atorvastatin was previously reduced to 40 mg due to fatigue.  [name] had labs checked with Dr. Manuella Ghazi last August and thinks that the levels were good but [name] does not know the specific values.   Past Medical History:  Diagnosis Date  . Arthritis   . CAD S/P percutaneous coronary angioplasty    a. s/p NSTEMI 7/17:  EF 55%. pLAD 40%, mLAD 50%, dLCx 20%, OM2 100%,>> PCI: 2.5 x 14 mm Resolute DES to OM2   . GERD (gastroesophageal reflux disease)   .  History of non-ST elevation myocardial infarction (NSTEMI)    Noted to have occluded OM 2 treated with DES stent  . Hyperlipidemia 09/17/2015  . Hypertension   . Hypertensive heart disease 09/17/2015  . Myocardial infarction ([place])    09/2015  . Pneumonia    1998  . PONV (postoperative nausea and vomiting)    After Cath    Past Surgical History:  Procedure Laterality Date  . BACK SURGERY     x2 2003 and 2005  . CARDIAC CATHETERIZATION N/A 09/17/2015   Procedure: Left Heart Cath and Coronary Angiography;  Surgeon: Troy Sine, MD;  Location: [place] [facility];  Service: Cardiovascular: EF 55%. pLAD 40%, mLAD 50%, dLCx 20%, OM2 100%,>> PCI: 2.5 x 14 mm Resolute DES to OM2   . CARDIAC CATHETERIZATION N/A 09/17/2015   Procedure: Coronary Stent Intervention;  Surgeon: Troy Sine, MD;  Location: [place] [facility];  Service: Cardiovascular:  PCI: 2.5 x 14 mm Resolute DES to OM2   . KNEE ARTHROSCOPY     Left 1994  . ROTATOR CUFF REPAIR Right   . TOTAL KNEE ARTHROPLASTY Left 02/12/2017   Procedure: LEFT TOTAL KNEE ARTHROPLASTY;  Surgeon: [name], MD;  [facility]: [facility];  Service: Orthopedics;  Laterality: Left;  120 mins  . TRANSTHORACIC  ECHOCARDIOGRAM  09/17/2015   Normal LV size and thickness. EF 50-55%. No regional wall motion abnormality. GR 1 DD.     Current Outpatient Medications  Medication Sig Dispense Refill  . Ascorbic Acid (VITAMIN C PO) Take 125 mg by mouth daily.    Marland Kitchen aspirin EC 81 MG tablet Take 81 mg by mouth daily.    Marland Kitchen atorvastatin (LIPITOR) 40 MG tablet Take 40 mg by mouth daily.    . meloxicam (MOBIC) 7.5 MG tablet Take 1 tablet by mouth as needed.    . metoprolol succinate (TOPROL-XL) 25 MG 24 hr tablet Take 0.5 tablets (12.5 mg total) by mouth daily. 15 tablet 1  . Multiple Vitamin (MULTIVITAMIN WITH MINERALS) TABS tablet Take 1 tablet by mouth daily.    . Triamcinolone Acetonide (NASACORT ALLERGY 24HR NA) Place 1 spray into the nose daily as needed  (allergies).    . nitroGLYCERIN (NITROSTAT) 0.4 MG SL tablet Place 1 tablet (0.4 mg total) under the tongue as needed. 25 tablet PRN   Current Facility-Administered Medications  Medication Dose Route Frequency Provider Last Rate Last Admin  . 0.9 %  sodium chloride infusion  500 mL Intravenous Once Nandigam, Eleonore Chiquito, MD        Allergies:   Patient has no known allergies.    Social History:  The patient  reports that [name] has never smoked. [name] has never used smokeless tobacco. [name] reports that [name] does not drink alcohol or use drugs.   Family History:  The patient's family history includes Heart attack in his brother and father.    ROS:  Please see the history of present illness.   Otherwise, review of systems are positive for fatigue.   All other systems are reviewed and negative.    PHYSICAL EXAM: VS:  BP 134/90   Pulse 64   Ht 5\' 7"  (1.702 m)   Wt 182 lb 3.2 oz (82.6 kg)   BMI 28.54 kg/m  , BMI Body mass index is 28.54 kg/m. GENERAL:  Well appearing HEENT: Pupils equal round and reactive, fundi not visualized, oral mucosa unremarkable NECK:  No jugular venous distention, waveform within normal limits, carotid upstroke brisk and symmetric, no bruits LUNGS:  Clear to auscultation bilaterally HEART:  RRR.  PMI not displaced or sustained,S1 and S2 within normal limits, no S3, no S4, no clicks, no rubs, no murmurs ABD:  Flat, positive bowel sounds normal in frequency in pitch, no bruits, no rebound, no guarding, no midline pulsatile mass, no hepatomegaly, no splenomegaly EXT:  2 plus pulses throughout, no edema, no cyanosis no clubbing.  Mild LLE edema SKIN:  No rashes no nodules NEURO:  Cranial nerves II through XII grossly intact, motor grossly intact throughout PSYCH:  Cognitively intact, oriented to person place and time   EKG:  EKG is ordered today. The ekg ordered 11/30/15 demonstrateSinus bradycardia. 57 bpm. 10/14/17: Sinus rhythm.  Rate 70 bpm.  Prior inferior infarct.    03/24/19: Sinus rhythm.  Rate 64 bpm.   Exercise Myoveiw 05/09/16: The left ventricular ejection fraction is normal (55-65%).  Nuclear stress EF: 55%.  There was no ST segment deviation noted during stress.  The study is normal.  This is a low risk study.   Normal stress nuclear study with no ischemia or infarction; EF 55 with normal wall motion. 09/18/15:    Prox LAD to Mid LAD lesion, 40% stenosed.  Mid LAD to Dist LAD lesion, 50% stenosed.  2nd Mrg lesion, 100% stenosed. Post  intervention, there is a 0% residual stenosis.  The left ventricular systolic function is normal.  Dist Cx lesion, 20% stenosed.  Transthoracic Echocardiography 09/17/15 Study Conclusions  - Left ventricle: The cavity size was normal. Wall thickness was  normal. Systolic function was normal. The estimated ejection  fraction was in the range of 50% to 55%. Wall motion was normal;  there were no regional wall motion abnormalities. Doppler  parameters are consistent with abnormal left ventricular  relaxation (grade 1 diastolic dysfunction).  Recent Labs: No results found for requested labs within last 8760 hours.   02/16/16: Sodium 143, potassium 4.6, BUN 18, creatinine 0.93 WBC 5.8, hemoglobin 15.1, hematocrit 45.4, platelets 300 Total cholesterol 129, trig glycerides 55, HDL 51, HDL 67 AST 20, ALT 24 TSH 3.35  Lipid Panel    Component Value Date/Time   CHOL 105 (L) 10/09/2015 0742   TRIG 87 10/09/2015 0742   HDL 37 (L) 10/09/2015 0742   CHOLHDL 2.8 10/09/2015 0742   VLDL 17 10/09/2015 0742   LDLCALC 51 10/09/2015 0742      Wt Readings from Last 3 Encounters:  03/24/19 182 lb 3.2 oz (82.6 kg)  10/14/17 178 lb (80.7 kg)  06/04/17 179 lb (81.2 kg)      ASSESSMENT AND PLAN:  # Palpitations: # Shortness of breath: [name] has rare palpitations.  It is unlikely to be captured on a monitor.  We discussed the [employer] app.  Breathing is stable.  # CAD s/p NSTEMI: #  Hyperlipidemia:  [name] is s/p DES in OM2 09/2015.  [name] continues to do well.  [name] has no exertional symptoms. Continue aspirin, atorvastatin, and metoprolol.  We will get a copy of his most recent lipids from his PCP.  His LDL is to be less than 70.  [name] has been taking atorvastatin 40mg .  [name] had fatigue at higher doses.  If [name] isn't at goal we will need to add something or try another agent.  # Elevated BP: BP elevated both initially and on repeat.  [name] is going to track his blood pressure at home and increase his exercise.  Current medicines are reviewed at length with the patient today.  The patient does not have concerns regarding medicines.  The following changes have been made: none  Labs/ tests ordered today include:   Orders Placed This Encounter  Procedures  . EKG 12-Lead     Disposition:   FU with Editha Bridgeforth C. , MD, [facility] in 2 months.   Signed, Senica Crall C. [facility], MD, [facility]  03/24/2019 10:30 AM    [facility]

## 2019-03-30 ENCOUNTER — Other Ambulatory Visit: Payer: Self-pay | Admitting: Cardiovascular Disease

## 2019-03-30 NOTE — Telephone Encounter (Signed)
*  STAT* If patient is at the pharmacy, call can be transferred to refill team.   1. Which medications need to be refilled? (please list name of each medication and dose if known)  metoprolol succinate (TOPROL-XL) 25 MG 24 hr tablet atorvastatin (LIPITOR) 40 MG tablet  2. Which pharmacy/location (including street and city if local pharmacy) is medication to be sent to? [id number] - [place], [place] - [address]  3. Do they need a 30 day or 90 day supply? 30 day  Patient is out of medication

## 2019-03-31 ENCOUNTER — Other Ambulatory Visit: Payer: Self-pay | Admitting: Cardiovascular Disease

## 2019-04-01 ENCOUNTER — Other Ambulatory Visit: Payer: Self-pay

## 2019-04-01 MED ORDER — ATORVASTATIN CALCIUM 40 MG PO TABS: 40.0000 mg | ORAL_TABLET | Freq: Every day | ORAL | 3 refills | Status: DC

## 2019-04-01 NOTE — Telephone Encounter (Signed)
Rx(s) sent to pharmacy electronically. Left message informing patient that we have sent his requested to the pharmacy. Advised patient to contact office with any questions or concerns.

## 2019-05-11 ENCOUNTER — Telehealth: Payer: 59 | Admitting: Cardiovascular Disease

## 2019-05-24 ENCOUNTER — Other Ambulatory Visit: Payer: Self-pay | Admitting: Cardiovascular Disease

## 2019-06-15 DIAGNOSIS — M545 Low back pain, unspecified: Secondary | ICD-10-CM | POA: Insufficient documentation

## 2019-06-29 ENCOUNTER — Other Ambulatory Visit: Payer: Self-pay | Admitting: Cardiovascular Disease

## 2019-06-30 ENCOUNTER — Telehealth: Payer: 59 | Admitting: Cardiovascular Disease

## 2019-06-30 NOTE — Telephone Encounter (Signed)
Rx(s) sent to pharmacy electronically.  

## 2019-07-06 ENCOUNTER — Ambulatory Visit (INDEPENDENT_AMBULATORY_CARE_PROVIDER_SITE_OTHER): Payer: 59 | Admitting: Cardiovascular Disease

## 2019-07-06 ENCOUNTER — Encounter: Payer: Self-pay | Admitting: Cardiovascular Disease

## 2019-07-06 ENCOUNTER — Other Ambulatory Visit: Payer: Self-pay

## 2019-07-06 VITALS — BP 144/88 | HR 67 | Ht 68.0 in | Wt 182.0 lb

## 2019-07-06 DIAGNOSIS — I119 Hypertensive heart disease without heart failure: Secondary | ICD-10-CM | POA: Diagnosis not present

## 2019-07-06 DIAGNOSIS — I10 Essential (primary) hypertension: Secondary | ICD-10-CM | POA: Insufficient documentation

## 2019-07-06 DIAGNOSIS — I25110 Atherosclerotic heart disease of native coronary artery with unstable angina pectoris: Secondary | ICD-10-CM | POA: Diagnosis not present

## 2019-07-06 DIAGNOSIS — Z5181 Encounter for therapeutic drug level monitoring: Secondary | ICD-10-CM | POA: Diagnosis not present

## 2019-07-06 DIAGNOSIS — E785 Hyperlipidemia, unspecified: Secondary | ICD-10-CM

## 2019-07-06 HISTORY — DX: Essential (primary) hypertension: I10

## 2019-07-06 MED ORDER — HYDROCHLOROTHIAZIDE 12.5 MG PO TABS
12.5000 mg | ORAL_TABLET | Freq: Every day | ORAL | 1 refills | Status: DC
Start: 2019-07-06 — End: 2019-10-20

## 2019-07-06 NOTE — Patient Instructions (Signed)
Medication Instructions:  START HYDROCHLOROTHIAZIDE 12.5 MG DAILY   *If you need a refill on your cardiac medications before your next appointment, please call your pharmacy*  Lab Work: BMET AT YOUR PRIMARY CARE AT YOUR VISIT IF THEY WILL NOT DO YOUR LABS COME TO THE OFFICE TO HAVE DONE   If you have labs (blood work) drawn today and your tests are completely normal, you will receive your results only by: Marland Kitchen MyChart Message (if you have MyChart) OR . A paper copy in the mail If you have any lab test that is abnormal or we need to change your treatment, we will call you to review the results.  Testing/Procedures: NONE   Follow-Up: At [facility], you and your health needs are our priority.  As part of our continuing mission to provide you with exceptional heart care, we have created designated Provider Care Teams.  These Care Teams include your primary Cardiologist (physician) and Advanced Practice Providers (APPs -  Physician Assistants and Nurse Practitioners) who all work together to provide you with the care you need, when you need it.  We recommend signing up for the patient portal called "MyChart".  Sign up information is provided on this After Visit Summary.  MyChart is used to connect with patients for Virtual Visits (Telemedicine).  Patients are able to view lab/test results, encounter notes, upcoming appointments, etc.  Non-urgent messages can be sent to your provider as well.   To learn more about what you can do with MyChart, go to [email or web address].    Your next appointment:   1 month(s)  The format for your next appointment:   Virtual Visit   Provider:   You may see DR [facility]  or one of the following Advanced Practice Providers on your designated Care Team:    Corine Shelter, PA-C  [place], [place]  Edd Fabian, [place]

## 2019-07-06 NOTE — Progress Notes (Signed)
Cardiology Office Note   Date:  07/06/2019   ID:  [name], DOB 06-Apr-1965, MRN [id number]    PCP:  [name], MD  Cardiologist:   [name], MD   No chief complaint on file.    History of Present Illness: [name] is a 54 y.o. male with CAD s/p DES to OM2, and hyperlipidemia who presents for follow up.  [name] was admitted 09/2015 with NSTEMI and found to have an occluded OM2.  He had a DES placed and had residual 50% LAD and 20% LCx lesions.  He was started on metoprolol, ticagrelor, aspirin and atorvastatin.  LVEF was 50-55%.  After being discharged he had some bradycardia and dizziness, so metoprolol was switched to carvedilol.  After making that change the dizziness improved.  At follow up he complained of fatigue and was switched to nebivolol.  However this was stopped due to dizziness and fatigue.  He Dr. [name] in clinic 04/04/16 due to chest pain.  He was started back on metoprolol and Imdur and referred for exercise Myoview 05/2016 revealed LVEF 55% and no ischemia. He was unable to tolerate Imdur 2/2 headaches.  Atorvastatin was reduced due to myalgias.  At his last appointment [name] was elevated.  He wanted to track it at home before making any changes.  He notes that at home his blood [name] has been running in the 130s to 140s over 70s to 80s.  He had it checked at work for health screen and it was 145/90.  He struggles with pain in his knee and has daily discomfort which he thinks is contributing.  1 day he had a headache and when he checked his blood [name] it was 160.  This is very rare.  He had one day of sharp CP and shortness of breath about one month ago.  It occurred two days after his COVID vaccine.  Since then he has not had any recurrent chest pain or [name].  He does note that he has some mild lower extremity edema at the end of the day.  It improves with elevation of his legs.  He has no shortness of breath,  orthopnea, or PND.  He lifts weights 3 days/week and walks 15,000 steps daily.  He has no exertional symptoms.  He uses ibuprofen 1 tablet daily and notes that his diet has been good.  He tries to limit his salt intake.   Past Medical History:  Diagnosis Date  . Arthritis   . CAD S/P percutaneous coronary angioplasty    a. s/p NSTEMI 7/17:  EF 55%. pLAD 40%, mLAD 50%, dLCx 20%, OM2 100%,>> PCI: 2.5 x 14 mm Resolute DES to OM2   . Essential hypertension 07/06/2019  . GERD (gastroesophageal reflux disease)   . History of non-ST elevation myocardial infarction (NSTEMI)    Noted to have occluded OM 2 treated with DES stent  . Hyperlipidemia 09/17/2015  . Hypertension   . Hypertensive heart disease 09/17/2015  . Myocardial infarction ([facility])    09/2015  . Pneumonia    1998  . PONV (postoperative nausea and vomiting)    After Cath    Past Surgical History:  Procedure Laterality Date  . BACK [name]     x2 2003 and 2005  . CARDIAC CATHETERIZATION N/A 09/17/2015   Procedure: Left Heart Cath and Coronary Angiography;  Surgeon: [name], MD;  [facility];  Service: Cardiovascular: EF 55%. pLAD 40%,  mLAD 50%, dLCx 20%, OM2 100%,>> PCI: 2.5 x 14 mm Resolute DES to OM2   . CARDIAC CATHETERIZATION N/A 09/17/2015   Procedure: Coronary Stent Intervention;  Surgeon: Troy Sine, MD;  Location: [place] [facility];  Service: Cardiovascular:  PCI: 2.5 x 14 mm Resolute DES to OM2   . KNEE ARTHROSCOPY     Left 1994  . ROTATOR CUFF REPAIR Right   . TOTAL KNEE ARTHROPLASTY Left 02/12/2017   Procedure: LEFT TOTAL KNEE ARTHROPLASTY;  Surgeon: [name], MD;  Location: [facility];  Service: Orthopedics;  Laterality: Left;  120 mins  . TRANSTHORACIC ECHOCARDIOGRAM  09/17/2015   Normal LV size and thickness. EF 50-55%. No regional wall motion abnormality. GR 1 DD.     Current Outpatient Medications  Medication Sig Dispense Refill  . Ascorbic Acid (VITAMIN C PO) Take 125 mg by mouth  daily.    Marland Kitchen aspirin EC 81 MG tablet Take 81 mg by mouth daily.    Marland Kitchen atorvastatin (LIPITOR) 40 MG tablet Take 1 tablet (40 mg total) by mouth daily at 6 PM. 90 tablet 2  . meloxicam (MOBIC) 7.5 MG tablet Take 1 tablet by mouth as needed.    . metoprolol succinate (TOPROL-XL) 25 MG 24 hr tablet TAKE 1/2 TABLET BY MOUTH EVERY DAY 90 tablet 1  . Multiple Vitamin (MULTIVITAMIN WITH MINERALS) TABS tablet Take 1 tablet by mouth daily.    . nitroGLYCERIN (NITROSTAT) 0.4 MG SL tablet Place 1 tablet (0.4 mg total) under the tongue as needed. 25 tablet PRN  . Triamcinolone Acetonide (NASACORT ALLERGY 24HR NA) Place 1 spray into the nose daily as needed (allergies).    . hydrochlorothiazide (HYDRODIURIL) 12.5 MG tablet Take 1 tablet (12.5 mg total) by mouth daily. 90 tablet 1   Current Facility-Administered Medications  Medication Dose Route Frequency Provider Last Rate Last Admin  . 0.9 %  sodium chloride infusion  500 mL Intravenous Once Nandigam, Venia Minks, MD        Allergies:   Patient has no known allergies.    Social History:  The patient  reports that he has never smoked. He has never used smokeless tobacco. He reports that he does not drink alcohol or use drugs.   Family History:  The patient's family history includes Heart attack in his brother and father.    ROS:  Please see the history of present illness.   Otherwise, review of systems are positive for fatigue.   All other systems are reviewed and negative.    PHYSICAL EXAM: VS:  BP (!) 144/88   Pulse 67   Ht 5\' 8"  (1.727 m)   Wt 182 lb (82.6 kg)   SpO2 98%   BMI 27.67 kg/m  , BMI Body mass index is 27.67 kg/m. GENERAL:  Well appearing HEENT: Pupils equal round and reactive, fundi not visualized, oral mucosa unremarkable NECK:  No jugular venous distention, waveform within normal limits, carotid upstroke brisk and symmetric, no bruits LUNGS:  Clear to auscultation bilaterally HEART:  RRR.  PMI not displaced or sustained,S1 and  S2 within normal limits, no S3, no S4, no clicks, no rubs, no murmurs ABD:  Flat, positive bowel sounds normal in frequency in pitch, no bruits, no rebound, no guarding, no midline pulsatile mass, no hepatomegaly, no splenomegaly EXT:  2 plus pulses throughout, no edema, no cyanosis no clubbing SKIN:  No rashes no nodules NEURO:  Cranial nerves II through XII grossly intact, motor grossly intact throughout PSYCH:  Cognitively intact, oriented to person place and time   EKG:  EKG is not ordered today. The ekg ordered 11/30/15 demonstrateSinus bradycardia. 57 bpm. 10/14/17: Sinus rhythm.  Rate 70 bpm.  Prior inferior infarct.  03/24/19: Sinus rhythm.  Rate 64 bpm.   Exercise Myoveiw 05/09/16: The left ventricular ejection fraction is normal (55-65%).  Nuclear stress EF: 55%.  There was no ST segment deviation noted during stress.  The study is normal.  This is a low risk study.   Normal stress nuclear study with no ischemia or infarction; EF 55 with normal wall motion. 09/18/15:    Prox LAD to Mid LAD lesion, 40% stenosed.  Mid LAD to Dist LAD lesion, 50% stenosed.  2nd Mrg lesion, 100% stenosed. Post intervention, there is a 0% residual stenosis.  The left ventricular systolic function is normal.  Dist Cx lesion, 20% stenosed.  Transthoracic Echocardiography 09/17/15 Study Conclusions  - Left ventricle: The cavity size was normal. Wall thickness was  normal. Systolic function was normal. The estimated ejection  fraction was in the range of 50% to 55%. Wall motion was normal;  there were no regional wall motion abnormalities. Doppler  parameters are consistent with abnormal left ventricular  relaxation (grade 1 diastolic dysfunction).  Recent Labs: No results found for requested labs within last 8760 hours.   02/16/16: Sodium 143, potassium 4.6, BUN 18, creatinine 0.93 WBC 5.8, hemoglobin 15.1, hematocrit 45.4, platelets 300 Total cholesterol 129, trig glycerides  55, HDL 51, HDL 67 AST 20, ALT 24 TSH 8.24  Lipid Panel    Component Value Date/Time   CHOL 105 (L) 10/09/2015 0742   TRIG 87 10/09/2015 0742   HDL 37 (L) 10/09/2015 0742   CHOLHDL 2.8 10/09/2015 0742   VLDL 17 10/09/2015 0742   LDLCALC 51 10/09/2015 0742    05/2019: Total cholesterol 111,LDL 47, HDL 40, triglycerides 118  Wt Readings from Last 3 Encounters:  07/06/19 182 lb (82.6 kg)  03/24/19 182 lb 3.2 oz (82.6 kg)  10/14/17 178 lb (80.7 kg)      ASSESSMENT AND PLAN:  # CAD s/p NSTEMI: # Hyperlipidemia:  [name] is s/p DES in OM2 09/2015.  He continues to do well.  Lipids are well-controlled on 05/2019.  Continue aspirin, metoprolol, and atorvastatin.  # Essential hypertension:   Blood [name] has been running [name] both at home and in the office.  He will continue metoprolol for CAD.  We will add HCTZ 12.5 mg daily.  He is about to have a appointment with his PCP and will need a basic metabolic panel checked at that time.  This should also help with his mild edema.  Low suspicion for heart failure.  I suspect it is more related to venous stasis and did recommend that he wear compression socks.  # Pre-operative risk:  [name] on his knee in the next few months.  He is low risk for [name] as he is able to walk 15,000 steps daily without chest pain or shortness of breath.  Current medicines are reviewed at length with the patient today.  The patient does not have concerns regarding medicines.  The following changes have been made: add hctz  Labs/ tests ordered today include:   Orders Placed This Encounter  Procedures  . Basic metabolic panel     Disposition:   FU with Amazin Pincock C. Duke Salvia, MD, [facility] virtually in 1 month  Signed, Marshel Golubski C. Duke Salvia, MD, [facility]  07/06/2019 8:55 AM  [facility]

## 2019-08-05 ENCOUNTER — Telehealth: Payer: 59 | Admitting: Cardiology

## 2019-10-04 ENCOUNTER — Telehealth: Payer: Self-pay

## 2019-10-04 NOTE — Telephone Encounter (Signed)
   Primary Cardiologist: Dr Duke Salvia  Chart reviewed and patient contacted by phone today as part of pre-operative protocol coverage. Given past medical history and time since last visit, based on ACC/AHA guidelines, [name] would be at acceptable risk for the planned procedure without further cardiovascular testing.   In general we prefer patients remain on aspirin therapy through the peri operative period but OK to hold 5-7 days pre op if needed and resume post op when safe.   I will route this recommendation to the requesting party via Epic fax function and remove from pre-op pool.  Please call with questions.  Corine Shelter, PA-C 10/04/2019, 1:23 PM

## 2019-10-04 NOTE — Telephone Encounter (Signed)
   [facility] Pre-operative Risk Assessment    Request for surgical clearance:  1. What type of surgery is being performed? RIGHT TOTAL KNEE   2. When is this surgery scheduled? TBD   3. What type of clearance is required (medical clearance vs. Pharmacy clearance to hold med vs. Both)? MEDICAL  4. Are there any medications that need to be held prior to surgery and how long?ASA??   5. Practice name and name of physician performing surgery? [facility]  6. What is the office phone number? [phone]   7.   What is the office fax number? [phone]  8.   Anesthesia type (None, local, MAC, general) ? SPINAL

## 2019-10-20 ENCOUNTER — Other Ambulatory Visit: Payer: Self-pay | Admitting: Cardiovascular Disease

## 2019-10-31 ENCOUNTER — Ambulatory Visit: Payer: Self-pay | Admitting: Orthopedic Surgery

## 2019-11-03 ENCOUNTER — Encounter (HOSPITAL_COMMUNITY): Admission: RE | Admit: 2019-11-03 | Payer: 59 | Source: Ambulatory Visit

## 2019-11-05 ENCOUNTER — Other Ambulatory Visit (HOSPITAL_COMMUNITY): Payer: 59

## 2019-11-09 ENCOUNTER — Encounter (HOSPITAL_COMMUNITY): Admission: RE | Payer: Self-pay | Source: Home / Self Care

## 2019-11-09 ENCOUNTER — Inpatient Hospital Stay (HOSPITAL_COMMUNITY): Admission: RE | Admit: 2019-11-09 | Payer: 59 | Source: Home / Self Care | Admitting: Specialist

## 2019-11-09 SURGERY — ARTHROPLASTY, KNEE, TOTAL
Anesthesia: Choice | Site: Knee | Laterality: Right

## 2019-12-26 ENCOUNTER — Ambulatory Visit: Payer: Self-pay | Admitting: Orthopedic Surgery

## 2020-01-03 ENCOUNTER — Other Ambulatory Visit: Payer: Self-pay | Admitting: Cardiovascular Disease

## 2020-01-24 NOTE — Progress Notes (Signed)
DUE TO COVID-19 ONLY ONE VISITOR IS ALLOWED TO COME WITH YOU AND STAY IN THE WAITING ROOM ONLY DURING PRE OP AND PROCEDURE DAY OF SURGERY. THE 1 VISITOR  MAY VISIT WITH YOU AFTER SURGERY IN YOUR PRIVATE ROOM DURING VISITING HOURS ONLY!  YOU NEED TO HAVE A COVID 19 TEST ON__11/27/2021 _____ @_______ , THIS TEST MUST BE DONE BEFORE SURGERY,  COVID TESTING SITE [address] [place] , IT IS ON THE RIGHT GOING OUT [address] APPROXIMATELY  2 MINUTES PAST [employer] ON THE RIGHT. ONCE YOUR COVID TEST IS COMPLETED,  PLEASE BEGIN THE QUARANTINE INSTRUCTIONS AS OUTLINED IN YOUR HANDOUT.                [name]  01/24/2020   Your procedure is scheduled on: 02/08/2020    Report to [facility] Main  Entrance   Report to admitting at   0600 AM     Call this number if you have problems the morning of surgery [phone]    REMEMBER: NO  SOLID FOOD CANDY OR GUM AFTER MIDNIGHT. CLEAR LIQUIDS UNTIL   0530am       . NOTHING BY MOUTH EXCEPT CLEAR LIQUIDS UNTIL    . PLEASE FINISH ENSURE DRINK PER SURGEON ORDER  WHICH NEEDS TO BE COMPLETED AT  0530am     .      CLEAR LIQUID DIET   Foods Allowed                                                                    Coffee and tea, regular and decaf                            Fruit ices (not with fruit pulp)                                      Iced Popsicles                                    Carbonated beverages, regular and diet                                    Cranberry, grape and apple juices Sports drinks like Gatorade Lightly seasoned clear broth or consume(fat free) Sugar, honey syrup ___________________________________________________________________      BRUSH YOUR TEETH MORNING OF SURGERY AND RINSE YOUR MOUTH OUT, NO CHEWING GUM CANDY OR MINTS.     Take these medicines the morning of surgery with A SIP OF WATER: Toprol   DO NOT TAKE ANY DIABETIC MEDICATIONS DAY OF YOUR SURGERY                                You may not have any metal on your body including hair pins and              piercings  Do not wear  jewelry, make-up, lotions, powders or perfumes, deodorant             Do not wear nail polish on your fingernails.  Do not shave  48 hours prior to surgery.              Men may shave face and neck.   Do not bring valuables to the hospital. [place].  Contacts, dentures or bridgework may not be worn into surgery.  Leave suitcase in the car. After surgery it may be brought to your room.     Patients discharged the day of surgery will not be allowed to drive home. IF YOU ARE HAVING SURGERY AND GOING HOME THE SAME DAY, YOU MUST HAVE AN ADULT TO DRIVE YOU HOME AND BE WITH YOU FOR 24 HOURS. YOU MAY GO HOME BY TAXI OR UBER OR ORTHERWISE, BUT AN ADULT MUST ACCOMPANY YOU HOME AND STAY WITH YOU FOR 24 HOURS.  Name and phone number of your driver:  Special Instructions: N/A              Please read over the following fact sheets you were given: _____________________________________________________________________  [facility] - Preparing for Surgery Before surgery, you can play an important role.  Because skin is not sterile, your skin needs to be as free of germs as possible.  You can reduce the number of germs on your skin by washing with CHG (chlorahexidine gluconate) soap before surgery.  CHG is an antiseptic cleaner which kills germs and bonds with the skin to continue killing germs even after washing. Please DO NOT use if you have an allergy to CHG or antibacterial soaps.  If your skin becomes reddened/irritated stop using the CHG and inform your nurse when you arrive at Short Stay. Do not shave (including legs and underarms) for at least 48 hours prior to the first CHG shower.  You may shave your face/neck. Please follow these instructions carefully:  1.  Shower with CHG Soap the night before surgery and the  morning of  Surgery.  2.  If you choose to wash your hair, wash your hair first as usual with your  normal  shampoo.  3.  After you shampoo, rinse your hair and body thoroughly to remove the  shampoo.                           4.  Use CHG as you would any other liquid soap.  You can apply chg directly  to the skin and wash                       Gently with a scrungie or clean washcloth.  5.  Apply the CHG Soap to your body ONLY FROM THE NECK DOWN.   Do not use on face/ open                           Wound or open sores. Avoid contact with eyes, ears mouth and genitals (private parts).                       Wash face,  Genitals (private parts) with your normal soap.             6.  Wash thoroughly, paying special  attention to the area where your surgery  will be performed.  7.  Thoroughly rinse your body with warm water from the neck down.  8.  DO NOT shower/wash with your normal soap after using and rinsing off  the CHG Soap.                9.  Pat yourself dry with a clean towel.            10.  Wear clean pajamas.            11.  Place clean sheets on your bed the night of your first shower and do not  sleep with pets. Day of Surgery : Do not apply any lotions/deodorants the morning of surgery.  Please wear clean clothes to the hospital/surgery center.  FAILURE TO FOLLOW THESE INSTRUCTIONS MAY RESULT IN THE CANCELLATION OF YOUR SURGERY PATIENT SIGNATURE_________________________________  NURSE SIGNATURE__________________________________  ________________________________________________________________________

## 2020-01-26 ENCOUNTER — Encounter (HOSPITAL_COMMUNITY)
Admission: RE | Admit: 2020-01-26 | Discharge: 2020-01-26 | Disposition: A | Discharge status: Home / Self Care | Payer: 59 | Source: Ambulatory Visit | Attending: Specialist | Admitting: Specialist

## 2020-01-26 ENCOUNTER — Encounter (HOSPITAL_COMMUNITY): Payer: Self-pay

## 2020-01-26 ENCOUNTER — Other Ambulatory Visit: Payer: Self-pay

## 2020-01-26 DIAGNOSIS — Z01812 Encounter for preprocedural laboratory examination: Secondary | ICD-10-CM | POA: Diagnosis present

## 2020-01-26 LAB — CBC
HCT: 44.5 % (ref 39.0–52.0)
Hemoglobin: 14.9 g/dL (ref 13.0–17.0)
MCH: 30 pg (ref 26.0–34.0)
MCHC: 33.5 g/dL (ref 30.0–36.0)
MCV: 89.5 fL (ref 80.0–100.0)
Platelets: 254 10*3/uL (ref 150–400)
RBC: 4.97 MIL/uL (ref 4.22–5.81)
RDW: 12.8 % (ref 11.5–15.5)
WBC: 5.6 10*3/uL (ref 4.0–10.5)
nRBC: 0 % (ref 0.0–0.2)

## 2020-01-26 LAB — BASIC METABOLIC PANEL
Anion gap: 8 (ref 5–15)
BUN: 15 mg/dL (ref 6–20)
CO2: 27 mmol/L (ref 22–32)
Calcium: 9.3 mg/dL (ref 8.9–10.3)
Chloride: 105 mmol/L (ref 98–111)
Creatinine, Ser: 0.81 mg/dL (ref 0.61–1.24)
GFR, Estimated: >60 mL/min (ref >60)
Glucose, Bld: 94 mg/dL (ref 70–99)
Potassium: 3.8 mmol/L (ref 3.5–5.1)
Sodium: 140 mmol/L (ref 135–145)

## 2020-01-26 LAB — URINALYSIS, ROUTINE W REFLEX MICROSCOPIC
Bilirubin Urine: NEGATIVE
Glucose, UA: NEGATIVE mg/dL
Hgb urine dipstick: NEGATIVE
Ketones, ur: NEGATIVE mg/dL
Leukocytes,Ua: NEGATIVE
Nitrite: NEGATIVE
Protein, ur: NEGATIVE mg/dL
Specific Gravity, Urine: 1.019 (ref 1.005–1.030)
pH: 6 (ref 5.0–8.0)

## 2020-01-26 LAB — SURGICAL PCR SCREEN
MRSA, PCR: NEGATIVE
Staphylococcus aureus: NEGATIVE

## 2020-01-26 LAB — PROTIME-INR
INR: 1 (ref 0.8–1.2)
Prothrombin Time: 13 seconds (ref 11.4–15.2)

## 2020-01-26 LAB — APTT: aPTT: 31 seconds (ref 24–36)

## 2020-01-26 NOTE — Progress Notes (Addendum)
Anesthesia Review:  PCP:  Cardiologist : DR [name]  LOV4/28/21-epic  Surgery was supposed to be 11/09/19 and cancelled Clearance dated 10/04/2019 - [name]  Chest x-ray : EKG :03/24/2019  Echo : 2017  Stress test: 2018  Cardiac Cath : 2017  Activity level:  Sleep Study/ CPAP : Fasting Blood Sugar :      / Checks Blood Sugar -- times a day:   Blood Thinner/ Instructions /Last Dose: ASA / Instructions/ Last Dose :

## 2020-01-27 NOTE — Progress Notes (Signed)
Anesthesia Chart Review   Case: [id number] Date/Time: 02/08/20 0815   Procedure: TOTAL KNEE ARTHROPLASTY (Right Knee) - 2.5 hrs   Anesthesia type: Choice   Pre-op diagnosis: Right knee degenerative joint disease   Location[facility] ROOM 05 / [facility]   Surgeons: [name], MD      DISCUSSION:54 y.o. never smoker with h/o PONV, GERD, HTN, CAD (DES to OM2), right knee djd scheduled for above procedure 02/08/20 with Dr. [name].   Per cardiology preoperative risk assessment, "Chart reviewed and patient contacted by phone today as part of pre-operative protocol coverage. Given past medical history and time since last visit, based on ACC/AHA guidelines, RONDALL RADIGAN would be at acceptable risk for the planned procedure without further cardiovascular testing.   In general we prefer patients remain on aspirin therapy through the peri operative period but OK to hold 5-7 days pre op if needed and resume post op when safe."  Anticipate pt can proceed with planned procedure barring acute status change.   VS: BP (!) 173/75   Pulse 60   Temp 36.8 C (Oral)   Resp 18   Ht 5\' 8"  (1.727 m)   Wt 81.7 kg   SpO2 99%   BMI 27.38 kg/m   PROVIDERS: , MD is PCP   [name], MD is Cardiologist  LABS: Labs reviewed: Acceptable for surgery. (all labs ordered are listed, but only abnormal results are displayed)  Labs Reviewed  SURGICAL PCR SCREEN  APTT  BASIC METABOLIC PANEL  CBC  PROTIME-INR  URINALYSIS, ROUTINE W REFLEX MICROSCOPIC     IMAGES:   EKG: 03/24/19 Rate 64 bpm  NSR  CV: Myocardial Perfusion 05/09/2016  The left ventricular ejection fraction is normal (55-65%).  Nuclear stress EF: 55%.  There was no ST segment deviation noted during stress.  The study is normal.  This is a low risk study.   Normal stress nuclear study with no ischemia or infarction; EF 55 with normal wall motion  Echo 09/17/2015 Study Conclusions   - Left ventricle: The  cavity size was normal. Wall thickness was  normal. Systolic function was normal. The estimated ejection  fraction was in the range of 50% to 55%. Wall motion was normal;  there were no regional wall motion abnormalities. Doppler  parameters are consistent with abnormal left ventricular  relaxation (grade 1 diastolic dysfunction).  Past Medical History:  Diagnosis Date  . Arthritis   . CAD S/P percutaneous coronary angioplasty    a. s/p NSTEMI 7/17:  EF 55%. pLAD 40%, mLAD 50%, dLCx 20%, OM2 100%,>> PCI: 2.5 x 14 mm Resolute DES to OM2   . Essential hypertension 07/06/2019  . GERD (gastroesophageal reflux disease)   . History of non-ST elevation myocardial infarction (NSTEMI)    Noted to have occluded OM 2 treated with DES stent  . Hyperlipidemia 09/17/2015  . Hypertension   . Hypertensive heart disease 09/17/2015  . Myocardial infarction (HCC)    09/2015  . Pneumonia    1998  . PONV (postoperative nausea and vomiting)    After Cath    Past Surgical History:  Procedure Laterality Date  . BACK SURGERY     x2 2003 and 2005  . CARDIAC CATHETERIZATION N/A 09/17/2015   Procedure: Left Heart Cath and Coronary Angiography;  Surgeon: 11/18/2015, MD;  Location: [facility] INVASIVE CV LAB;  Service: Cardiovascular: EF 55%. pLAD 40%, mLAD 50%, dLCx 20%, OM2 100%,>> PCI: 2.5 x 14 mm Resolute DES to  OM2   . CARDIAC CATHETERIZATION N/A 09/17/2015   Procedure: Coronary Stent Intervention;  Surgeon: [name], MD;  Location: [facility] INVASIVE CV LAB;  Service: Cardiovascular:  PCI: 2.5 x 14 mm Resolute DES to OM2   . coronary stents    . KNEE ARTHROSCOPY     Left 1994  . ROTATOR CUFF REPAIR Right   . TOTAL KNEE ARTHROPLASTY Left 02/12/2017   Procedure: LEFT TOTAL KNEE ARTHROPLASTY;  Surgeon: [name], MD;  Location: [facility];  Service: Orthopedics;  Laterality: Left;  120 mins  . TRANSTHORACIC ECHOCARDIOGRAM  09/17/2015   Normal LV size and thickness. EF 50-55%. No regional wall motion  abnormality. GR 1 DD.    MEDICATIONS: . Ascorbic Acid (VITAMIN C PO)  . aspirin EC 81 MG tablet  . atorvastatin (LIPITOR) 40 MG tablet  . diclofenac Sodium (VOLTAREN) 1 % GEL  . hydrochlorothiazide (HYDRODIURIL) 12.5 MG tablet  . hydrocortisone cream 1 %  . ibuprofen (ADVIL) 800 MG tablet  . meloxicam (MOBIC) 7.5 MG tablet  . metoprolol succinate (TOPROL-XL) 25 MG 24 hr tablet  . Multiple Vitamin (MULTIVITAMIN WITH MINERALS) TABS tablet  . nitroGLYCERIN (NITROSTAT) 0.4 MG SL tablet  . oxyCODONE-acetaminophen (PERCOCET/ROXICET) 5-325 MG tablet  . Triamcinolone Acetonide (NASACORT ALLERGY 24HR NA)   No current facility-administered medications for this encounter.   Jodell Cipro, PA-C [facility] Pre-Surgical Testing [phone]

## 2020-01-27 NOTE — Anesthesia Preprocedure Evaluation (Addendum)
Anesthesia Evaluation  Patient identified by MRN, date of birth, ID band Patient awake    Reviewed: Allergy & Precautions, NPO status , Patient's Chart, lab work & pertinent test results  History of Anesthesia Complications (+) PONV and history of anesthetic complications  Airway Mallampati: III  TM Distance: >3 FB Neck ROM: Full    Dental  (+) Dental Advisory Given, Chipped   Pulmonary neg pulmonary ROS,    Pulmonary exam normal        Cardiovascular hypertension, Pt. on home beta blockers and Pt. on medications (-) angina+ CAD, + Past MI and + Cardiac Stents  Normal cardiovascular exam   '18 Myocardial Perfusion - The left ventricular ejection fraction is normal (55-65%).Nuclear stress EF: 55%. There was no ST segment deviation noted during stress. The study is normal. This is a low risk study.  '17 TTE - EF 50% to 55%. Grade 1 diastolic dysfunction.     Neuro/Psych negative neurological ROS  negative psych ROS   GI/Hepatic Neg liver ROS, GERD  Controlled and Medicated,  Endo/Other  negative endocrine ROS  Renal/GU negative Renal ROS     Musculoskeletal  (+) Arthritis ,   Abdominal   Peds  Hematology negative hematology ROS (+)   Anesthesia Other Findings Covid test negative See PAT note  Reproductive/Obstetrics                           Anesthesia Physical Anesthesia Plan  ASA: III  Anesthesia Plan: Spinal   Post-op Pain Management:  Regional for Post-op pain   Induction:   PONV Risk Score and Plan: 2 and Treatment may vary due to age or medical condition and Propofol infusion  Airway Management Planned: Natural Airway and Simple Face Mask  Additional Equipment: None  Intra-op Plan:   Post-operative Plan:   Informed Consent: I have reviewed the patients History and Physical, chart, labs and discussed the procedure including the risks, benefits and alternatives for  the proposed anesthesia with the patient or authorized representative who has indicated his/her understanding and acceptance.       Plan Discussed with: CRNA and Anesthesiologist  Anesthesia Plan Comments: (Labs reviewed, platelets acceptable. Discussed risks and benefits of spinal, including spinal/epidural hematoma, infection, failed block, and PDPH. Patient expressed understanding and wished to proceed. )      Anesthesia Quick Evaluation

## 2020-02-04 ENCOUNTER — Other Ambulatory Visit (HOSPITAL_COMMUNITY)
Admission: RE | Admit: 2020-02-04 | Discharge: 2020-02-04 | Disposition: A | Discharge status: Home / Self Care | Payer: 59 | Source: Ambulatory Visit | Attending: Specialist | Admitting: Specialist

## 2020-02-04 DIAGNOSIS — Z01812 Encounter for preprocedural laboratory examination: Secondary | ICD-10-CM | POA: Diagnosis present

## 2020-02-04 DIAGNOSIS — Z20822 Contact with and (suspected) exposure to covid-19: Secondary | ICD-10-CM | POA: Diagnosis not present

## 2020-02-05 LAB — SARS CORONAVIRUS 2 (TAT 6-24 HRS): SARS Coronavirus 2: NEGATIVE

## 2020-02-07 ENCOUNTER — Ambulatory Visit: Payer: Self-pay | Admitting: Orthopedic Surgery

## 2020-02-07 NOTE — H&P (View-Only) (Signed)
[name] is an 54 y.o. male.   Chief Complaint: R knee pain HPI: The patient is scheduled for a right total knee replacement by Dr. Shelle Iron on 02/08/20 at [facility].  Dr. Shelle Iron and the patient mutually agreed to proceed with a right total knee replacement. Risks and benefits of the procedure were discussed including stiffness, suboptimal range of motion, persistent pain, infection requiring removal of prosthesis and reinsertion, need for prophylactic antibiotics in the future, for example, dental procedures, possible need for manipulation, revision in the future and also anesthetic complications including DVT, PE, etc. We discussed the perioperative course, time in the hospital, postoperative recovery and the need for elevation to control swelling. We also discussed the predicted range of motion and the probability that squatting and kneeling would be unobtainable in the future. In addition, postoperative anticoagulation was discussed. We have obtained preoperative medical clearance as necessary. Provided illustrated handout and discussed it in detail. They will enroll in the total joint replacement educational forum at the hospital.  He is planning on doing his PT at [facility] hand in rehab. He is hoping for 1 week of home therapy then transitioning to outpatient fairly quickly. Last time he had issues with sleep and did well with trazodone postoperatively for that.  Past Medical History:  Diagnosis Date  . Arthritis   . CAD S/P percutaneous coronary angioplasty    a. s/p NSTEMI 7/17:  EF 55%. pLAD 40%, mLAD 50%, dLCx 20%, OM2 100%,>> PCI: 2.5 x 14 mm Resolute DES to OM2   . Essential hypertension 07/06/2019  . GERD (gastroesophageal reflux disease)   . History of non-ST elevation myocardial infarction (NSTEMI)    Noted to have occluded OM 2 treated with DES stent  . Hyperlipidemia 09/17/2015  . Hypertension   . Hypertensive heart disease 09/17/2015  . Myocardial infarction (HCC)     09/2015  . Pneumonia    1998  . PONV (postoperative nausea and vomiting)    After Cath    Past Surgical History:  Procedure Laterality Date  . BACK SURGERY     x2 2003 and 2005  . CARDIAC CATHETERIZATION N/A 09/17/2015   Procedure: Left Heart Cath and Coronary Angiography;  Surgeon: [name], MD;  Location: [facility] INVASIVE CV LAB;  Service: Cardiovascular: EF 55%. pLAD 40%, mLAD 50%, dLCx 20%, OM2 100%,>> PCI: 2.5 x 14 mm Resolute DES to OM2   . CARDIAC CATHETERIZATION N/A 09/17/2015   Procedure: Coronary Stent Intervention;  Surgeon: [name], MD;  Location: [facility] INVASIVE CV LAB;  Service: Cardiovascular:  PCI: 2.5 x 14 mm Resolute DES to OM2   . coronary stents    . KNEE ARTHROSCOPY     Left 1994  . ROTATOR CUFF REPAIR Right   . TOTAL KNEE ARTHROPLASTY Left 02/12/2017   Procedure: LEFT TOTAL KNEE ARTHROPLASTY;  Surgeon: [name], MD;  Location[facility];  Service: Orthopedics;  Laterality: Left;  120 mins  . TRANSTHORACIC ECHOCARDIOGRAM  09/17/2015   Normal LV size and thickness. EF 50-55%. No regional wall motion abnormality. GR 1 DD.    Family History  Problem Relation Age of Onset  . Heart attack Father        CABG x 2.   . Heart attack Brother        2 brothers with MI and stents.   . Colon cancer Neg Hx   . Stomach cancer Neg Hx   . Esophageal cancer Neg Hx    Social  History:  reports that he has never smoked. He has quit using smokeless tobacco. He reports that he does not drink alcohol and does not use drugs.  Allergies: No Known Allergies  (Not in a hospital admission)   No results found for this or any previous visit (from the past 48 hour(s)). No results found.  Review of Systems  Constitutional: Negative.   HENT: Negative.   Eyes: Negative.   Respiratory: Negative.   Cardiovascular: Negative.   Gastrointestinal: Negative.   Endocrine: Negative.   Genitourinary: Negative.   Musculoskeletal: Positive for arthralgias, back pain and joint swelling.   Skin: Negative.   Neurological: Negative.   Psychiatric/Behavioral: Negative.     There were no vitals taken for this visit. Physical Exam Constitutional:      Appearance: Normal appearance.  HENT:     Head: Normocephalic.     Right Ear: External ear normal.     Left Ear: External ear normal.     Nose: Nose normal.     Mouth/Throat:     Mouth: Mucous membranes are moist.  Cardiovascular:     Rate and Rhythm: Normal rate and regular rhythm.     Pulses: Normal pulses.  Pulmonary:     Effort: Pulmonary effort is normal.  Abdominal:     General: Abdomen is flat.  Musculoskeletal:     Cervical back: Normal range of motion.     Comments: Patient is a 54 year old male.  Patient is awake, alert, oriented 3. Well-nourished and well-developed. Antalgic gait with no assistive devices.  On examination of the right knee, tender on palpation of the medial joint line. Nontender lateral joint line, patellar tendon, quadriceps tendon, patella, peroneal nerve and popliteal space. No calf pain or sign of DVT. No pain or laxity with varus or valgus stress. No instability noted. Negative McMurray's. Trace effusion noted. Range of motion 0- 90 . Negative patellofemoral crepitus. No patellofemoral pain on compression. Sensation intact distally.  Skin:    General: Skin is warm and dry.  Neurological:     Mental Status: He is alert.    Prior x-rays reviewed, bone-on-bone end-stage medial joint space narrowing with a varus deformity right knee. Status post total knee replacement on the left which is in excellent alignment with no signs of osteolysis or loosening.  Assessment/Plan Impression: Right knee end-stage osteoarthritis  Plan:Pt with end-stage right knee DJD, bone-on-bone, refractory to conservative tx, scheduled for 8 total knee replacement by Dr. Shelle Iron on #1. We again discussed the procedure itself as well as risks, complications and alternatives, including but not limited to DVT, PE,  infx, bleeding, failure of procedure, need for secondary procedure including manipulation, nerve injury, ongoing pain/symptoms, anesthesia risk, even stroke or death. Also discussed typical post-op protocols, activity restrictions, need for PT, flexion/extension exercises, time out of work. Discussed need for DVT ppx post-op per protocol. Discussed dental ppx and infx prevention. Also discussed limitations post-operatively such as kneeling and squatting. All questions were answered. Patient desires to proceed with surgery as scheduled.  Will hold supplements, ASA and NSAIDs accordingly. Will remain NPO after midnight the night before surgery. [facility] present to [facility] for pre-op testing. Anticipate hospital stay to include at least 2 midnights given medical history and to ensure proper pain control. Plan aspirin 81 mg twice a day for DVT ppx post-op. Plan oxycodone, Robaxin, Colace, Miralax, trazodone. Plan home with HHPT post-op with family members at home for assistance with a quick transition to outpatient PT as we will want  him to work on regaining range of motion early. Will follow up 10-14 days post-op for full removal and xrays.  Plan right total knee replacement   Dorothy Spark, PA-C for Dr. Shelle Iron 02/07/2020, 1:26 PM

## 2020-02-07 NOTE — H&P (Signed)
[name] is an 54 y.o. male.   Chief Complaint: R knee pain HPI: The patient is scheduled for a right total knee replacement by Dr. Shelle Iron on 02/08/20 at [facility].  Dr. Shelle Iron and the patient mutually agreed to proceed with a right total knee replacement. Risks and benefits of the procedure were discussed including stiffness, suboptimal range of motion, persistent pain, infection requiring removal of prosthesis and reinsertion, need for prophylactic antibiotics in the future, for example, dental procedures, possible need for manipulation, revision in the future and also anesthetic complications including DVT, PE, etc. We discussed the perioperative course, time in the hospital, postoperative recovery and the need for elevation to control swelling. We also discussed the predicted range of motion and the probability that squatting and kneeling would be unobtainable in the future. In addition, postoperative anticoagulation was discussed. We have obtained preoperative medical clearance as necessary. Provided illustrated handout and discussed it in detail. They will enroll in the total joint replacement educational forum at the hospital.  He is planning on doing his PT at [facility] hand in rehab. He is hoping for 1 week of home therapy then transitioning to outpatient fairly quickly. Last time he had issues with sleep and did well with trazodone postoperatively for that.  Past Medical History:  Diagnosis Date  . Arthritis   . CAD S/P percutaneous coronary angioplasty    a. s/p NSTEMI 7/17:  EF 55%. pLAD 40%, mLAD 50%, dLCx 20%, OM2 100%,>> PCI: 2.5 x 14 mm Resolute DES to OM2   . Essential hypertension 07/06/2019  . GERD (gastroesophageal reflux disease)   . History of non-ST elevation myocardial infarction (NSTEMI)    Noted to have occluded OM 2 treated with DES stent  . Hyperlipidemia 09/17/2015  . Hypertension   . Hypertensive heart disease 09/17/2015  . Myocardial infarction (HCC)     09/2015  . Pneumonia    1998  . PONV (postoperative nausea and vomiting)    After Cath    Past Surgical History:  Procedure Laterality Date  . BACK SURGERY     x2 2003 and 2005  . CARDIAC CATHETERIZATION N/A 09/17/2015   Procedure: Left Heart Cath and Coronary Angiography;  Surgeon: [name], MD;  Location: [facility] INVASIVE CV LAB;  Service: Cardiovascular: EF 55%. pLAD 40%, mLAD 50%, dLCx 20%, OM2 100%,>> PCI: 2.5 x 14 mm Resolute DES to OM2   . CARDIAC CATHETERIZATION N/A 09/17/2015   Procedure: Coronary Stent Intervention;  Surgeon: [name], MD;  Location: [facility] INVASIVE CV LAB;  Service: Cardiovascular:  PCI: 2.5 x 14 mm Resolute DES to OM2   . coronary stents    . KNEE ARTHROSCOPY     Left 1994  . ROTATOR CUFF REPAIR Right   . TOTAL KNEE ARTHROPLASTY Left 02/12/2017   Procedure: LEFT TOTAL KNEE ARTHROPLASTY;  Surgeon: [name], MD;  Location: [facility] ORS;  Service: Orthopedics;  Laterality: Left;  120 mins  . TRANSTHORACIC ECHOCARDIOGRAM  09/17/2015   Normal LV size and thickness. EF 50-55%. No regional wall motion abnormality. GR 1 DD.    Family History  Problem Relation Age of Onset  . Heart attack Father        CABG x 2.   . Heart attack Brother        2 brothers with MI and stents.   . Colon cancer Neg Hx   . Stomach cancer Neg Hx   . Esophageal cancer Neg Hx    Social  History:  reports that he has never smoked. He has quit using smokeless tobacco. He reports that he does not drink alcohol and does not use drugs.  Allergies: No Known Allergies  (Not in a hospital admission)   No results found for this or any previous visit (from the past 48 hour(s)). No results found.  Review of Systems  Constitutional: Negative.   HENT: Negative.   Eyes: Negative.   Respiratory: Negative.   Cardiovascular: Negative.   Gastrointestinal: Negative.   Endocrine: Negative.   Genitourinary: Negative.   Musculoskeletal: Positive for arthralgias, back pain and joint swelling.   Skin: Negative.   Neurological: Negative.   Psychiatric/Behavioral: Negative.     There were no vitals taken for this visit. Physical Exam Constitutional:      Appearance: Normal appearance.  HENT:     Head: Normocephalic.     Right Ear: External ear normal.     Left Ear: External ear normal.     Nose: Nose normal.     Mouth/Throat:     Mouth: Mucous membranes are moist.  Cardiovascular:     Rate and Rhythm: Normal rate and regular rhythm.     Pulses: Normal pulses.  Pulmonary:     Effort: Pulmonary effort is normal.  Abdominal:     General: Abdomen is flat.  Musculoskeletal:     Cervical back: Normal range of motion.     Comments: Patient is a 54 year old male.  Patient is awake, alert, oriented 3. Well-nourished and well-developed. Antalgic gait with no assistive devices.  On examination of the right knee, tender on palpation of the medial joint line. Nontender lateral joint line, patellar tendon, quadriceps tendon, patella, peroneal nerve and popliteal space. No calf pain or sign of DVT. No pain or laxity with varus or valgus stress. No instability noted. Negative McMurray's. Trace effusion noted. Range of motion 0- 90 . Negative patellofemoral crepitus. No patellofemoral pain on compression. Sensation intact distally.  Skin:    General: Skin is warm and dry.  Neurological:     Mental Status: He is alert.    Prior x-rays reviewed, bone-on-bone end-stage medial joint space narrowing with a varus deformity right knee. Status post total knee replacement on the left which is in excellent alignment with no signs of osteolysis or loosening.  Assessment/Plan Impression: Right knee end-stage osteoarthritis  Plan:Pt with end-stage right knee DJD, bone-on-bone, refractory to conservative tx, scheduled for 8 total knee replacement by Dr. Shelle Iron on #1. We again discussed the procedure itself as well as risks, complications and alternatives, including but not limited to DVT, PE,  infx, bleeding, failure of procedure, need for secondary procedure including manipulation, nerve injury, ongoing pain/symptoms, anesthesia risk, even stroke or death. Also discussed typical post-op protocols, activity restrictions, need for PT, flexion/extension exercises, time out of work. Discussed need for DVT ppx post-op per protocol. Discussed dental ppx and infx prevention. Also discussed limitations post-operatively such as kneeling and squatting. All questions were answered. Patient desires to proceed with surgery as scheduled.  Will hold supplements, ASA and NSAIDs accordingly. Will remain NPO after midnight the night before surgery. Will present to [facility] for pre-op testing. Anticipate hospital stay to include at least 2 midnights given medical history and to ensure proper pain control. Plan aspirin 81 mg twice a day for DVT ppx post-op. Plan oxycodone, Robaxin, Colace, Miralax, trazodone. Plan home with HHPT post-op with family members at home for assistance with a quick transition to outpatient PT as we will want  him to work on regaining range of motion early. Will follow up 10-14 days post-op for full removal and xrays.  Plan right total knee replacement   Dorothy Spark, PA-C for Dr. Shelle Iron 02/07/2020, 1:26 PM

## 2020-02-08 ENCOUNTER — Encounter (HOSPITAL_COMMUNITY): Payer: Self-pay | Admitting: Specialist

## 2020-02-08 ENCOUNTER — Inpatient Hospital Stay (HOSPITAL_COMMUNITY)
Admission: RE | Admit: 2020-02-08 | Discharge: 2020-02-09 | DRG: 470 | Disposition: A | Discharge status: Home Health | Payer: 59 | Attending: Specialist | Admitting: Specialist

## 2020-02-08 ENCOUNTER — Inpatient Hospital Stay (HOSPITAL_COMMUNITY): Payer: 59 | Admitting: Certified Registered"

## 2020-02-08 ENCOUNTER — Encounter (HOSPITAL_COMMUNITY)
Admission: RE | Disposition: A | Discharge status: Home Health | Payer: Self-pay | Source: Home / Self Care | Attending: Specialist

## 2020-02-08 ENCOUNTER — Inpatient Hospital Stay (HOSPITAL_COMMUNITY): Payer: 59 | Admitting: Physician Assistant

## 2020-02-08 ENCOUNTER — Observation Stay (HOSPITAL_COMMUNITY): Payer: 59

## 2020-02-08 ENCOUNTER — Other Ambulatory Visit: Payer: Self-pay

## 2020-02-08 DIAGNOSIS — Z955 Presence of coronary angioplasty implant and graft: Secondary | ICD-10-CM

## 2020-02-08 DIAGNOSIS — M1711 Unilateral primary osteoarthritis, right knee: Principal | ICD-10-CM | POA: Diagnosis present

## 2020-02-08 DIAGNOSIS — I2510 Atherosclerotic heart disease of native coronary artery without angina pectoris: Secondary | ICD-10-CM | POA: Diagnosis present

## 2020-02-08 DIAGNOSIS — I252 Old myocardial infarction: Secondary | ICD-10-CM

## 2020-02-08 DIAGNOSIS — Z8249 Family history of ischemic heart disease and other diseases of the circulatory system: Secondary | ICD-10-CM

## 2020-02-08 DIAGNOSIS — M21161 Varus deformity, not elsewhere classified, right knee: Secondary | ICD-10-CM | POA: Diagnosis present

## 2020-02-08 DIAGNOSIS — I10 Essential (primary) hypertension: Secondary | ICD-10-CM | POA: Diagnosis present

## 2020-02-08 DIAGNOSIS — Z96659 Presence of unspecified artificial knee joint: Secondary | ICD-10-CM

## 2020-02-08 DIAGNOSIS — K219 Gastro-esophageal reflux disease without esophagitis: Secondary | ICD-10-CM | POA: Diagnosis present

## 2020-02-08 DIAGNOSIS — Z96652 Presence of left artificial knee joint: Secondary | ICD-10-CM | POA: Diagnosis present

## 2020-02-08 DIAGNOSIS — Z87891 Personal history of nicotine dependence: Secondary | ICD-10-CM

## 2020-02-08 HISTORY — PX: TOTAL KNEE ARTHROPLASTY: SHX125

## 2020-02-08 SURGERY — ARTHROPLASTY, KNEE, TOTAL
Anesthesia: Spinal | Site: Knee | Laterality: Right

## 2020-02-08 MED ORDER — BUPIVACAINE-EPINEPHRINE (PF) 0.25% -1:200000 IJ SOLN
INTRAMUSCULAR | Status: AC
  Filled 2020-02-08: qty 30

## 2020-02-08 MED ORDER — POLYETHYLENE GLYCOL 3350 17 G PO PACK: 17.0000 g | PACK | Freq: Every day | ORAL | Status: DC | PRN

## 2020-02-08 MED ORDER — ASPIRIN EC 81 MG PO TBEC: 81.0000 mg | DELAYED_RELEASE_TABLET | Freq: Two times a day (BID) | ORAL | 1 refills | Status: DC

## 2020-02-08 MED ORDER — ROPIVACAINE HCL 7.5 MG/ML IJ SOLN
INTRAMUSCULAR | Status: DC | PRN
  Administered 2020-02-08: 20 mL via PERINEURAL

## 2020-02-08 MED ORDER — METHOCARBAMOL 500 MG PO TABS: 500.0000 mg | ORAL_TABLET | Freq: Three times a day (TID) | ORAL | 1 refills | Status: DC | PRN

## 2020-02-08 MED ORDER — EPHEDRINE SULFATE-NACL 50-0.9 MG/10ML-% IV SOSY
PREFILLED_SYRINGE | INTRAVENOUS | Status: DC | PRN
  Administered 2020-02-08: 10 mg via INTRAVENOUS

## 2020-02-08 MED ORDER — OXYCODONE HCL 5 MG/5ML PO SOLN: 5.0000 mg | Freq: Once | ORAL | Status: DC | PRN

## 2020-02-08 MED ORDER — PROPOFOL 1000 MG/100ML IV EMUL
INTRAVENOUS | Status: AC
  Filled 2020-02-08: qty 100

## 2020-02-08 MED ORDER — ACETAMINOPHEN 325 MG PO TABS: 325.0000 mg | ORAL_TABLET | Freq: Four times a day (QID) | ORAL | Status: DC | PRN

## 2020-02-08 MED ORDER — MAGNESIUM CITRATE PO SOLN: 1.0000 | Freq: Once | ORAL | Status: DC | PRN

## 2020-02-08 MED ORDER — POVIDONE-IODINE 7.5 % EX SOLN: Freq: Once | CUTANEOUS | Status: DC

## 2020-02-08 MED ORDER — METHOCARBAMOL 500 MG IVPB - SIMPLE MED
500.0000 mg | Freq: Four times a day (QID) | INTRAVENOUS | Status: DC | PRN
  Filled 2020-02-08: qty 50

## 2020-02-08 MED ORDER — ONDANSETRON HCL 4 MG/2ML IJ SOLN: 4.0000 mg | Freq: Four times a day (QID) | INTRAMUSCULAR | Status: DC | PRN

## 2020-02-08 MED ORDER — PHENOL 1.4 % MT LIQD: 1.0000 | OROMUCOSAL | Status: DC | PRN

## 2020-02-08 MED ORDER — DIPHENHYDRAMINE HCL 12.5 MG/5ML PO ELIX: 12.5000 mg | ORAL_SOLUTION | ORAL | Status: DC | PRN

## 2020-02-08 MED ORDER — BISACODYL 5 MG PO TBEC: 5.0000 mg | DELAYED_RELEASE_TABLET | Freq: Every day | ORAL | Status: DC | PRN

## 2020-02-08 MED ORDER — OXYCODONE HCL 5 MG PO TABS
5.0000 mg | ORAL_TABLET | ORAL | Status: DC | PRN
  Administered 2020-02-08 (×2): 5 mg via ORAL
  Administered 2020-02-08: 10 mg via ORAL
  Administered 2020-02-09 (×2): 5 mg via ORAL
  Filled 2020-02-08: qty 2
  Filled 2020-02-08: qty 1
  Filled 2020-02-08: qty 2
  Filled 2020-02-08 (×2): qty 1
  Filled 2020-02-08: qty 2

## 2020-02-08 MED ORDER — METOPROLOL SUCCINATE ER 25 MG PO TB24
12.5000 mg | ORAL_TABLET | Freq: Every day | ORAL | Status: DC
  Administered 2020-02-09: 12.5 mg via ORAL
  Filled 2020-02-08: qty 1

## 2020-02-08 MED ORDER — PROPOFOL 10 MG/ML IV BOLUS
INTRAVENOUS | Status: DC | PRN
  Administered 2020-02-08 (×2): 20 mg via INTRAVENOUS

## 2020-02-08 MED ORDER — SODIUM CHLORIDE 0.9 % IR SOLN
Status: DC | PRN
  Administered 2020-02-08: 1000 mL

## 2020-02-08 MED ORDER — FENTANYL CITRATE (PF) 100 MCG/2ML IJ SOLN: 25.0000 ug | INTRAMUSCULAR | Status: DC | PRN

## 2020-02-08 MED ORDER — TRANEXAMIC ACID-NACL 1000-0.7 MG/100ML-% IV SOLN
1000.0000 mg | INTRAVENOUS | Status: AC
  Administered 2020-02-08: 1000 mg via INTRAVENOUS
  Filled 2020-02-08: qty 100

## 2020-02-08 MED ORDER — ACETAMINOPHEN 10 MG/ML IV SOLN
1000.0000 mg | INTRAVENOUS | Status: AC
  Administered 2020-02-08: 1000 mg via INTRAVENOUS
  Filled 2020-02-08: qty 100

## 2020-02-08 MED ORDER — NITROGLYCERIN 0.4 MG SL SUBL: 0.4000 mg | SUBLINGUAL_TABLET | SUBLINGUAL | Status: DC | PRN

## 2020-02-08 MED ORDER — BUPIVACAINE IN DEXTROSE 0.75-8.25 % IT SOLN
INTRATHECAL | Status: DC | PRN
  Administered 2020-02-08: 1.8 mL via INTRATHECAL

## 2020-02-08 MED ORDER — METOCLOPRAMIDE HCL 5 MG PO TABS: 5.0000 mg | ORAL_TABLET | Freq: Three times a day (TID) | ORAL | Status: DC | PRN

## 2020-02-08 MED ORDER — CEFAZOLIN SODIUM-DEXTROSE 2-4 GM/100ML-% IV SOLN
2.0000 g | Freq: Four times a day (QID) | INTRAVENOUS | Status: AC
  Administered 2020-02-08 (×2): 2 g via INTRAVENOUS
  Filled 2020-02-08 (×2): qty 100

## 2020-02-08 MED ORDER — TRIAMCINOLONE ACETONIDE 55 MCG/ACT NA AERO
1.0000 | INHALATION_SPRAY | Freq: Every day | NASAL | Status: DC | PRN
  Filled 2020-02-08: qty 10.8

## 2020-02-08 MED ORDER — RISAQUAD PO CAPS
1.0000 | ORAL_CAPSULE | Freq: Every day | ORAL | Status: DC
  Administered 2020-02-09: 1 via ORAL
  Filled 2020-02-08: qty 1

## 2020-02-08 MED ORDER — ASPIRIN 81 MG PO CHEW
81.0000 mg | CHEWABLE_TABLET | Freq: Two times a day (BID) | ORAL | Status: DC
  Administered 2020-02-09: 81 mg via ORAL
  Filled 2020-02-08: qty 1

## 2020-02-08 MED ORDER — KCL IN DEXTROSE-NACL 20-5-0.45 MEQ/L-%-% IV SOLN
INTRAVENOUS | Status: AC
  Filled 2020-02-08 (×2): qty 1000

## 2020-02-08 MED ORDER — MENTHOL 3 MG MT LOZG: 1.0000 | LOZENGE | OROMUCOSAL | Status: DC | PRN

## 2020-02-08 MED ORDER — ASCORBIC ACID 500 MG PO TABS
500.0000 mg | ORAL_TABLET | Freq: Every day | ORAL | Status: DC
  Administered 2020-02-09: 500 mg via ORAL
  Filled 2020-02-08: qty 1

## 2020-02-08 MED ORDER — MIDAZOLAM HCL 5 MG/5ML IJ SOLN
INTRAMUSCULAR | Status: DC | PRN
  Administered 2020-02-08 (×2): 1 mg via INTRAVENOUS

## 2020-02-08 MED ORDER — PROMETHAZINE HCL 25 MG/ML IJ SOLN: 6.2500 mg | INTRAMUSCULAR | Status: DC | PRN

## 2020-02-08 MED ORDER — DOCUSATE SODIUM 100 MG PO CAPS: 100.0000 mg | ORAL_CAPSULE | Freq: Two times a day (BID) | ORAL | 2 refills | Status: AC

## 2020-02-08 MED ORDER — HYDROMORPHONE HCL 1 MG/ML IJ SOLN
0.5000 mg | INTRAMUSCULAR | Status: DC | PRN
  Administered 2020-02-08: 1 mg via INTRAVENOUS
  Filled 2020-02-08: qty 1

## 2020-02-08 MED ORDER — ONDANSETRON HCL 4 MG/2ML IJ SOLN
INTRAMUSCULAR | Status: AC
  Filled 2020-02-08: qty 2

## 2020-02-08 MED ORDER — HYDROCHLOROTHIAZIDE 25 MG PO TABS
12.5000 mg | ORAL_TABLET | Freq: Every day | ORAL | Status: DC
  Administered 2020-02-09: 12.5 mg via ORAL
  Filled 2020-02-08: qty 1

## 2020-02-08 MED ORDER — FENTANYL CITRATE (PF) 100 MCG/2ML IJ SOLN
INTRAMUSCULAR | Status: AC
  Filled 2020-02-08: qty 2

## 2020-02-08 MED ORDER — CEFAZOLIN SODIUM-DEXTROSE 2-4 GM/100ML-% IV SOLN
2.0000 g | INTRAVENOUS | Status: AC
  Administered 2020-02-08: 2 g via INTRAVENOUS
  Filled 2020-02-08: qty 100

## 2020-02-08 MED ORDER — METOCLOPRAMIDE HCL 5 MG/ML IJ SOLN: 5.0000 mg | Freq: Three times a day (TID) | INTRAMUSCULAR | Status: DC | PRN

## 2020-02-08 MED ORDER — ALUM & MAG HYDROXIDE-SIMETH 200-200-20 MG/5ML PO SUSP: 30.0000 mL | ORAL | Status: DC | PRN

## 2020-02-08 MED ORDER — CHLORHEXIDINE GLUCONATE 0.12 % MT SOLN
15.0000 mL | Freq: Once | OROMUCOSAL | Status: AC
  Administered 2020-02-08: 15 mL via OROMUCOSAL

## 2020-02-08 MED ORDER — DOCUSATE SODIUM 100 MG PO CAPS
100.0000 mg | ORAL_CAPSULE | Freq: Two times a day (BID) | ORAL | Status: DC
  Administered 2020-02-08 – 2020-02-09 (×2): 100 mg via ORAL
  Filled 2020-02-08 (×2): qty 1

## 2020-02-08 MED ORDER — LACTATED RINGERS IV SOLN: INTRAVENOUS | Status: DC

## 2020-02-08 MED ORDER — DEXAMETHASONE SODIUM PHOSPHATE 10 MG/ML IJ SOLN
INTRAMUSCULAR | Status: AC
  Filled 2020-02-08: qty 1

## 2020-02-08 MED ORDER — POLYETHYLENE GLYCOL 3350 17 G PO PACK
17.0000 g | PACK | Freq: Every day | ORAL | 0 refills | Status: DC
Start: 2020-02-08 — End: 2021-04-12

## 2020-02-08 MED ORDER — MIDAZOLAM HCL 2 MG/2ML IJ SOLN
INTRAMUSCULAR | Status: AC
  Filled 2020-02-08: qty 2

## 2020-02-08 MED ORDER — ONDANSETRON HCL 4 MG/2ML IJ SOLN
INTRAMUSCULAR | Status: DC | PRN
  Administered 2020-02-08: 4 mg via INTRAVENOUS

## 2020-02-08 MED ORDER — PROPOFOL 500 MG/50ML IV EMUL
INTRAVENOUS | Status: DC | PRN
  Administered 2020-02-08: 100 ug/kg/min via INTRAVENOUS

## 2020-02-08 MED ORDER — METHOCARBAMOL 500 MG PO TABS
500.0000 mg | ORAL_TABLET | Freq: Four times a day (QID) | ORAL | Status: DC | PRN
  Administered 2020-02-08 (×2): 500 mg via ORAL
  Filled 2020-02-08 (×2): qty 1

## 2020-02-08 MED ORDER — DEXAMETHASONE SODIUM PHOSPHATE 10 MG/ML IJ SOLN
INTRAMUSCULAR | Status: DC | PRN
  Administered 2020-02-08: 10 mg via INTRAVENOUS

## 2020-02-08 MED ORDER — FENTANYL CITRATE (PF) 100 MCG/2ML IJ SOLN
INTRAMUSCULAR | Status: DC | PRN
  Administered 2020-02-08 (×2): 50 ug via INTRAVENOUS

## 2020-02-08 MED ORDER — PROPOFOL 10 MG/ML IV BOLUS
INTRAVENOUS | Status: AC
  Filled 2020-02-08: qty 20

## 2020-02-08 MED ORDER — OXYCODONE HCL 5 MG PO TABS
10.0000 mg | ORAL_TABLET | ORAL | Status: DC | PRN
  Administered 2020-02-09: 10 mg via ORAL

## 2020-02-08 MED ORDER — IRRISEPT - 450ML BOTTLE WITH 0.05% CHG IN STERILE WATER, USP 99.95% OPTIME
TOPICAL | Status: DC | PRN
  Administered 2020-02-08: 450 mL via TOPICAL

## 2020-02-08 MED ORDER — POLYETHYLENE GLYCOL 3350 17 G PO PACK: 17.0000 g | PACK | Freq: Every day | ORAL | 0 refills | Status: DC

## 2020-02-08 MED ORDER — ACETAMINOPHEN 500 MG PO TABS
1000.0000 mg | ORAL_TABLET | Freq: Four times a day (QID) | ORAL | Status: DC
  Administered 2020-02-08 – 2020-02-09 (×3): 1000 mg via ORAL
  Filled 2020-02-08 (×3): qty 2

## 2020-02-08 MED ORDER — OXYCODONE-ACETAMINOPHEN 10-325 MG PO TABS
1.0000 | ORAL_TABLET | ORAL | 0 refills | Status: DC | PRN
Start: 2020-02-08 — End: 2021-04-12

## 2020-02-08 MED ORDER — OXYCODONE HCL 5 MG PO TABS: 5.0000 mg | ORAL_TABLET | Freq: Once | ORAL | Status: DC | PRN

## 2020-02-08 MED ORDER — BUPIVACAINE-EPINEPHRINE 0.25% -1:200000 IJ SOLN
INTRAMUSCULAR | Status: DC | PRN
  Administered 2020-02-08: 60 mL

## 2020-02-08 MED ORDER — ONDANSETRON HCL 4 MG PO TABS: 4.0000 mg | ORAL_TABLET | Freq: Four times a day (QID) | ORAL | Status: DC | PRN

## 2020-02-08 SURGICAL SUPPLY — 82 items
ATTUNE MED DOME PAT 41 KNEE (Knees) ×2 IMPLANT
ATTUNE MED DOME PAT 41MM KNEE (Knees) ×1 IMPLANT
ATTUNE PS FEM RT SZ 6 CEM KNEE (Femur) ×3 IMPLANT
ATTUNE PSRP INSR SZ6 7 KNEE (Insert) ×2 IMPLANT
ATTUNE PSRP INSR SZ6 7MM KNEE (Insert) ×1 IMPLANT
BAG DECANTER FOR FLEXI CONT (MISCELLANEOUS) ×3 IMPLANT
BAG ZIPLOCK 12X15 (MISCELLANEOUS) IMPLANT
BASE TIBIA ATTUNE KNEE SYS SZ6 (Knees) ×1 IMPLANT
BLADE SAG 18X100X1.27 (BLADE) ×3 IMPLANT
BLADE SAW SGTL 11.0X1.19X90.0M (BLADE) ×3 IMPLANT
BLADE SAW SGTL 13.0X1.19X90.0M (BLADE) ×3 IMPLANT
BLADE SURG SZ10 CARB STEEL (BLADE) ×6 IMPLANT
BNDG COHESIVE 4X5 TAN STRL (GAUZE/BANDAGES/DRESSINGS) ×3 IMPLANT
BNDG ELASTIC 4X5.8 VLCR STR LF (GAUZE/BANDAGES/DRESSINGS) ×3 IMPLANT
BNDG ELASTIC 6X5.8 VLCR STR LF (GAUZE/BANDAGES/DRESSINGS) ×3 IMPLANT
CEMENT HV SMART SET (Cement) ×6 IMPLANT
CLOSURE WOUND 1/2 X4 (GAUZE/BANDAGES/DRESSINGS) ×2
COVER SURGICAL LIGHT HANDLE (MISCELLANEOUS) ×3 IMPLANT
COVER WAND RF STERILE (DRAPES) IMPLANT
CUFF TOURN SGL QUICK 34 (TOURNIQUET CUFF) ×3
CUFF TRNQT CYL 34X4.125X (TOURNIQUET CUFF) ×1 IMPLANT
DECANTER SPIKE VIAL GLASS SM (MISCELLANEOUS) ×3 IMPLANT
DRAPE INCISE IOBAN 66X45 STRL (DRAPES) IMPLANT
DRAPE ORTHO SPLIT 77X108 STRL (DRAPES) ×6
DRAPE SHEET LG 3/4 BI-LAMINATE (DRAPES) ×9 IMPLANT
DRAPE SURG ORHT 6 SPLT 77X108 (DRAPES) ×2 IMPLANT
DRAPE U-SHAPE 47X51 STRL (DRAPES) ×3 IMPLANT
DRSG AQUACEL AG ADV 3.5X10 (GAUZE/BANDAGES/DRESSINGS) ×3 IMPLANT
DRSG TEGADERM 4X4.75 (GAUZE/BANDAGES/DRESSINGS) IMPLANT
DURAPREP 26ML APPLICATOR (WOUND CARE) ×3 IMPLANT
ELECT BLADE TIP CTD 4 INCH (ELECTRODE) ×3 IMPLANT
ELECT REM PT RETURN 15FT ADLT (MISCELLANEOUS) ×3 IMPLANT
EVACUATOR 1/8 PVC DRAIN (DRAIN) IMPLANT
GAUZE SPONGE 2X2 8PLY STRL LF (GAUZE/BANDAGES/DRESSINGS) IMPLANT
GLOVE BIOGEL PI IND STRL 7.5 (GLOVE) ×1 IMPLANT
GLOVE BIOGEL PI IND STRL 8 (GLOVE) ×1 IMPLANT
GLOVE BIOGEL PI INDICATOR 7.5 (GLOVE) ×2
GLOVE BIOGEL PI INDICATOR 8 (GLOVE) ×2
GLOVE SURG SS PI 7.5 STRL IVOR (GLOVE) ×6 IMPLANT
GLOVE SURG SS PI 8.0 STRL IVOR (GLOVE) ×6 IMPLANT
GOWN STRL REUS W/TWL XL LVL3 (GOWN DISPOSABLE) ×6 IMPLANT
HANDPIECE INTERPULSE COAX TIP (DISPOSABLE) ×3
HEMOSTAT SPONGE AVITENE ULTRA (HEMOSTASIS) ×3 IMPLANT
HOLDER FOLEY CATH W/STRAP (MISCELLANEOUS) IMPLANT
IMMOBILIZER KNEE 20 (SOFTGOODS) ×3
IMMOBILIZER KNEE 20 THIGH 36 (SOFTGOODS) ×1 IMPLANT
JET LAVAGE IRRISEPT WOUND (IRRIGATION / IRRIGATOR) ×3
KIT TURNOVER KIT A (KITS) IMPLANT
LAVAGE JET IRRISEPT WOUND (IRRIGATION / IRRIGATOR) ×1 IMPLANT
MANIFOLD NEPTUNE II (INSTRUMENTS) ×3 IMPLANT
NDL SAFETY ECLIPSE 18X1.5 (NEEDLE) IMPLANT
NEEDLE HYPO 18GX1.5 SHARP (NEEDLE)
NS IRRIG 1000ML POUR BTL (IV SOLUTION) IMPLANT
PACK TOTAL KNEE CUSTOM (KITS) ×3 IMPLANT
PIN DRILL FIX HALF THREAD (BIT) ×3 IMPLANT
PIN STEINMAN FIXATION KNEE (PIN) ×3 IMPLANT
PROTECTOR NERVE ULNAR (MISCELLANEOUS) ×3 IMPLANT
SEALER BIPOLAR AQUA 6.0 (INSTRUMENTS) IMPLANT
SET HNDPC FAN SPRY TIP SCT (DISPOSABLE) ×1 IMPLANT
SPONGE GAUZE 2X2 STER 10/PKG (GAUZE/BANDAGES/DRESSINGS)
SPONGE SURGIFOAM ABS GEL 100 (HEMOSTASIS) IMPLANT
STAPLER VISISTAT (STAPLE) IMPLANT
STRIP CLOSURE SKIN 1/2X4 (GAUZE/BANDAGES/DRESSINGS) ×4 IMPLANT
SUT BONE WAX W31G (SUTURE) ×3 IMPLANT
SUT MNCRL AB 4-0 PS2 18 (SUTURE) IMPLANT
SUT STRATAFIX 0 PDS 27 VIOLET (SUTURE) ×6
SUT VIC AB 1 CT1 27 (SUTURE) ×6
SUT VIC AB 1 CT1 27XBRD ANTBC (SUTURE) ×2 IMPLANT
SUT VIC AB 1 CTX 36 (SUTURE)
SUT VIC AB 1 CTX36XBRD ANBCTR (SUTURE) IMPLANT
SUT VIC AB 2-0 CT1 27 (SUTURE) ×9
SUT VIC AB 2-0 CT1 TAPERPNT 27 (SUTURE) ×3 IMPLANT
SUTURE STRATFX 0 PDS 27 VIOLET (SUTURE) ×2 IMPLANT
SYR 3ML LL SCALE MARK (SYRINGE) IMPLANT
SYR 50ML LL SCALE MARK (SYRINGE) IMPLANT
SYR BULB IRRIG 60ML STRL (SYRINGE) ×3 IMPLANT
TIBIA ATTUNE KNEE SYS BASE SZ6 (Knees) ×3 IMPLANT
TOWER CARTRIDGE SMART MIX (DISPOSABLE) ×3 IMPLANT
TRAY FOLEY MTR SLVR 16FR STAT (SET/KITS/TRAYS/PACK) ×3 IMPLANT
WATER STERILE IRR 1000ML POUR (IV SOLUTION) ×3 IMPLANT
WIPE CHG CHLORHEXIDINE 2% (PERSONAL CARE ITEMS) ×3 IMPLANT
WRAP KNEE MAXI GEL POST OP (GAUZE/BANDAGES/DRESSINGS) ×3 IMPLANT

## 2020-02-08 NOTE — Op Note (Signed)
NAME: [name], [name] MEDICAL RECORD [id number] ACCOUNT [id number] DATE OF BIRTH:Jul 09, 1965 FACILITY: [facility] LOCATION: [facility] PHYSICIAN:Verniece Encarnacion Connye Burkitt, MD  OPERATIVE REPORT  DATE OF PROCEDURE:  02/08/2020  PREOPERATIVE DIAGNOSES:  End-stage osteoarthrosis, varus deformity of the right knee.  POSTOPERATIVE DIAGNOSES:  End-stage osteoarthrosis, varus deformity of the right knee.  PROCEDURE PERFORMED:  Right total knee arthroplasty utilizing DePuy Attune rotating platform 6 femur, 6 tibia, 7 mm insert, 41 patella.  ANESTHESIA:  Spinal.  ASSISTANT:  Andrez Grime, PA  INDICATION:  A 54 year old with end-stage osteoarthrosis medial compartment of the knee, slight flexion contracture, varus deformity, bone-on-bone arthrosis medial compartment indicated for replacement of the degenerative joint.  Risks and benefits  discussed including bleeding, infection, damage to neurovascular structures, no change in symptoms, worsening symptoms, DVT, PE, anesthetic complications, etc.  TECHNIQUE:  With the patient in supine position after induction of adequate spinal anesthesia, the right lower extremity was prepped and draped and exsanguinated in the usual sterile fashion.  Thigh tourniquet inflated to 250 mmHg.  Knee was slightly  flexed.  Midline incision based over the patella was made.  Full-thickness flaps developed.  Median parapatellar arthrotomy was then performed.  Elevated soft tissues medially preserving the MCL.  Patella was everted, gently flexed.  Tricompartmental  osteoarthrosis was noted, bone-on-bone, particularly medial compartment, patellofemoral joint.  Osteophytes were removed with a rongeur.  Remnants of the ACL, medial and lateral menisci were removed as well.  I used a step drill to enter the femoral  canal above the notch, medial aspect of that.  I then irrigated the femoral canal and used a T-handle reamer without difficulty.  I placed in an intramedullary guide.  I utilized  a 5-degree right with 10 off the distal femur.  This was then pinned and  performed a distal femoral cut.  I then sized the femur off the anterior cortex to a 6.  This was then pinned, and we placed the distal femoral cutting guide 6, this was measured to.  I then performed anterior, posterior chamfer cuts with soft tissues  protected at all times posteriorly and then subluxed the tibia.  The low side was posteromedially.  Using external alignment guide ____ the defect, which was medially.  Parallel to the shaft, 3 degrees slope.  Bisecting the tibiotalar joint.  This was  pinned protecting popliteus, proximal tibial cut performed.  Following this, we checked our extension gap, and it was satisfactory.  Next, I reflexed the knee, subluxed the tibia, sized it to a 6 with maximal coverage just to the medial aspect of the  tibial tubercle.  This was then pinned.  We harvested bone from the tibial canal and impacted into the distal femur.  I centrally drilled the tibial canal and used the punch guide.  I then placed a trial femur 6 with a 6 mm insert.  Then reduced the  knee.  We had full extension and full flexion and good stability with varus valgus stressing at 0 and 30 degrees and negative anterior drawer.  I then everted the patella that was measured at a 24, planed it to a 15.  Utilizing the patellar jig.  I then  sized it to a 41 medializing the patella and the trial paddle, which was parallel to the joint.  I then drilled our patellar holes.  I placed a trial patellar component, reduced it and had excellent patellofemoral tracking.  We then flexed the knee.  All  components were removed.  Checked posteriorly.  Capsule was intact.  I removed any remnants of the menisci, cauterized the geniculates.  We then copiously irrigated the knee with pulsatile lavage.  We flexed the knee, everted the patella, subluxed the  tibia, thoroughly dried all surfaces.  Mixed cement on the back table in appropriate fashion.   Placed cement on the permanent tibial tray and injected into the tibial canal digitally pressurizing it.  I impacted the tibial tray.  Redundant cement was  removed.  I cemented and impacted the femur with redundant cement removed.  I placed a 6 mm insert, reduced the knee and held in extension with an axial load applied through the curing of the cement.  Redundant cement removed.  I cemented and clamped the  patella.  0.25% Marcaine with epinephrine was placed in the joint during the curing of the cement.  The wound was covered.  Following the appropriate curing of the cement, tourniquet was deflated.  Any minor bleeding was cauterized.  Next, patella  everted, knee was flexed.  We felt an extension, perhaps a slight recurvatum.  We selected a 7 and that was satisfactory.  This was removed, and we meticulously removed all redundant cement.  We then subluxed the tibia.  Pulsatile lavage was used.    Selected a 7 permanent insert, reduced it without difficulty.  We had full extension, full flexion, good stability with varus valgus stressing at 0 and 30 degrees.  Negative anterior drawer.  Excellent patellofemoral tracking.  Following this, the knee  was slightly flexed, reapproximated the patellar arthrotomy with #1 Vicryl interrupted figure-of-eight sutures.  This was oversewn with a running StrataFix.  Following this, we had excellent patellofemoral tracking.  Negative anterior drawer.  Good  stability.  Full flexion  as well as extension.  Next, subcutaneous with 2-0, skin with staples.  Wound was then dressed sterilely.  Placed in immobilizer and transported to the recovery room in satisfactory condition.  The patient tolerated the procedure well.  No complications.  Assistant Andrez Grime, [place].  Tourniquet time was 70 minutes.  IN/NUANCE  D:02/08/2020 T:02/08/2020 JOB:[id number]

## 2020-02-08 NOTE — Plan of Care (Signed)
°  Problem: Activity: Goal: Risk for activity intolerance will decrease 02/08/2020 2338 by Towanda Malkin, RN Outcome: Progressing 02/08/2020 2337 by Towanda Malkin, RN Outcome: Progressing   Problem: Activity: Goal: Ability to avoid complications of mobility impairment will improve 02/08/2020 2338 by Towanda Malkin, RN Outcome: Progressing 02/08/2020 2337 by Towanda Malkin, RN Outcome: Progressing Goal: Range of joint motion will improve 02/08/2020 2338 by Towanda Malkin, RN Outcome: Progressing 02/08/2020 2337 by Towanda Malkin, RN Outcome: Progressing   Problem: Activity: Goal: Range of joint motion will improve 02/08/2020 2338 by Towanda Malkin, RN Outcome: Progressing 02/08/2020 2337 by Towanda Malkin, RN Outcome: Progressing

## 2020-02-08 NOTE — Anesthesia Postprocedure Evaluation (Signed)
Anesthesia Post Note  Patient: [name]  Procedure(s) Performed: TOTAL KNEE ARTHROPLASTY (Right Knee)     Patient location during evaluation: PACU Anesthesia Type: Spinal Level of consciousness: awake and alert Pain management: pain level controlled Vital Signs Assessment: post-procedure vital signs reviewed and stable Respiratory status: spontaneous breathing and respiratory function stable Cardiovascular status: blood pressure returned to baseline and stable Postop Assessment: spinal receding and no apparent nausea or vomiting Anesthetic complications: no   No complications documented.  Last Vitals:  Vitals:   02/08/20 1145 02/08/20 1212  BP: 122/61 127/68  Pulse: 73 77  Resp: 16 15  Temp:  36.7 C  SpO2: 100% 98%    Last Pain:  Vitals:   02/08/20 1241  TempSrc:   PainSc: 0-No pain                 Beryle Lathe

## 2020-02-08 NOTE — Interval H&P Note (Signed)
History and Physical Interval Note:  02/08/2020 8:05 AM  [name]  has presented today for surgery, with the diagnosis of Right knee degenerative joint disease.  The various methods of treatment have been discussed with the patient and family. After consideration of risks, benefits and other options for treatment, the patient has consented to  Procedure(s) with comments: TOTAL KNEE ARTHROPLASTY (Right) - 2.5 hrs as a surgical intervention.  The patient's history has been reviewed, patient examined, no change in status, stable for surgery.  I have reviewed the patient's chart and labs.  Questions were answered to the patient's satisfaction.     Javier Docker

## 2020-02-08 NOTE — Brief Op Note (Signed)
02/08/2020  11:12 AM  PATIENT:  [name]  54 y.o. male  PRE-OPERATIVE DIAGNOSIS:  Right knee degenerative joint disease  POST-OPERATIVE DIAGNOSIS:  Right knee degenerative joint disease  PROCEDURE:  Procedure(s) with comments: TOTAL KNEE ARTHROPLASTY (Right) - 2.5 hrs  SURGEON:  Surgeon(s) and Role:    [name], MD - Primary  PHYSICIAN ASSISTANT:   ASSISTANTS: Bissell   ANESTHESIA:   spinal  EBL:  50 mL   BLOOD ADMINISTERED:none  DRAINS: none   LOCAL MEDICATIONS USED:  MARCAINE     SPECIMEN:  No Specimen  DISPOSITION OF SPECIMEN:  N/A  COUNTS:  YES  TOURNIQUET:   Total Tourniquet Time Documented: Calf (Right) - 74 minutes Total: Calf (Right) - 74 minutes   DICTATION: .Other Dictation: Dictation Number  [id number]  PLAN OF CARE: Admit for overnight observation  PATIENT DISPOSITION:  PACU - hemodynamically stable.   Delay start of Pharmacological VTE agent (>24hrs) due to surgical blood loss or risk of bleeding: no

## 2020-02-08 NOTE — Evaluation (Signed)
Physical Therapy Evaluation Patient Details Name: [name] MRN: [id number] DOB: August 28, 1965 Today's Date: 02/08/2020   History of Present Illness  Patient is 54 y.o. male s/p Rt TKA on 02/08/20 with PMH significant for HTN, HLD, GERD, NSTEMI, CAD, OA, Lt TKA in 2018.    Clinical Impression  [name] is a 54 y.o. male POD 0 s/p Rt TKA. Patient reports independence with mobility at baseline. Patient is now limited by functional impairments (see PT problem list below) and requires min assist for transfers and gait with RW. Patient was able to ambulate ~70 feet with RW and min assist. Patient had slight extensor lag with SLR on Rt and knee immobilizer utilized for gait. Patient instructed in exercise to facilitate ROM and circulation. Patient will benefit from continued skilled PT interventions to address impairments and progress towards PLOF. Acute PT will follow to progress mobility and stair training in preparation for safe discharge home.     Follow Up Recommendations Follow surgeon's recommendation for DC plan and follow-up therapies;Home health PT    Equipment Recommendations  None recommended by PT    Recommendations for Other Services       Precautions / Restrictions Precautions Precautions: Fall Restrictions Weight Bearing Restrictions: No Other Position/Activity Restrictions: WBAT      Mobility  Bed Mobility Overal bed mobility: Needs Assistance Bed Mobility: Supine to Sit     Supine to sit: Min assist;HOB elevated          Transfers Overall transfer level: Needs assistance Equipment used: Rolling walker (2 wheeled) Transfers: Sit to/from Stand Sit to Stand: Min assist         General transfer comment: cues for technique with RW and light assist to steady with rise.  Ambulation/Gait Ambulation/Gait assistance: Min assist Gait Distance (Feet): 70 Feet Assistive device: Rolling walker (2 wheeled) Gait Pattern/deviations: Step-to  pattern;Decreased stride length;Decreased weight shift to right Gait velocity: decr   General Gait Details: VC's for step pattern and proximity to RW, pt with tendency to step too close to RW. no overt LOB noted, assist for walker position.  Stairs            Wheelchair Mobility    Modified Rankin (Stroke Patients Only)       Balance Overall balance assessment: Needs assistance Sitting-balance support: Feet supported Sitting balance-Leahy Scale: Good     Standing balance support: During functional activity;Bilateral upper extremity supported Standing balance-Leahy Scale: Poor Standing balance comment: reliant on external support                             Pertinent Vitals/Pain Pain Assessment: 0-10 Pain Score: 3  Pain Location: Rt knee and posterior leg Pain Descriptors / Indicators: Aching;Discomfort Pain Intervention(s): Limited activity within patient's tolerance;Monitored during session;Repositioned;Ice applied    Home Living Family/patient expects to be discharged to:: Private residence Living Arrangements: Spouse/significant other Available Help at Discharge: Family Type of Home: House Home Access: Stairs to enter Entrance Stairs-Rails: None Entrance Stairs-Number of Steps: 1 Home Layout: One level Home Equipment: Environmental consultant - 2 wheels;Bedside commode;Cane - single point;Grab bars - tub/shower;Shower seat - built in      Prior Function Level of Independence: Independent         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   Dominant Hand: Right    Extremity/Trunk Assessment   Upper Extremity Assessment Upper Extremity Assessment: Overall WFL for tasks assessed  Lower Extremity Assessment Lower Extremity Assessment: RLE deficits/detail RLE Deficits / Details: 3-/5 with extensor lag during straight leg raise, immobilizer used for gait RLE Sensation: WNL RLE Coordination: WNL    Cervical / Trunk Assessment Cervical / Trunk  Assessment: Normal  Communication   Communication: No difficulties  Cognition Arousal/Alertness: Awake/alert Behavior During Therapy: WFL for tasks assessed/performed Overall Cognitive Status: Within Functional Limits for tasks assessed                                        General Comments      Exercises Total Joint Exercises Ankle Circles/Pumps: AROM;Both;20 reps;Seated Quad Sets: AROM;Right;10 reps;Seated   Assessment/Plan    PT Assessment Patient needs continued PT services  PT Problem List Decreased strength;Decreased range of motion;Decreased activity tolerance;Decreased balance;Decreased mobility;Decreased knowledge of use of DME;Decreased knowledge of precautions       PT Treatment Interventions DME instruction;Gait training;Stair training;Functional mobility training;Therapeutic activities;Balance training;Therapeutic exercise;Patient/family education    PT Goals (Current goals can be found in the Care Plan section)  Acute Rehab PT Goals Patient Stated Goal: get back independence and return to hunting PT Goal Formulation: With patient Time For Goal Achievement: 02/15/20 Potential to Achieve Goals: Good    Frequency 7X/week   Barriers to discharge        Co-evaluation               AM-PAC PT "6 Clicks" Mobility  Outcome Measure Help needed turning from your back to your side while in a flat bed without using bedrails?: None Help needed moving from lying on your back to sitting on the side of a flat bed without using bedrails?: None Help needed moving to and from a bed to a chair (including a wheelchair)?: A Little Help needed standing up from a chair using your arms (e.g., wheelchair or bedside chair)?: A Little Help needed to walk in hospital room?: A Little Help needed climbing 3-5 steps with a railing? : A Little 6 Click Score: 20    End of Session Equipment Utilized During Treatment: Gait belt;Right knee immobilizer Activity  Tolerance: Patient tolerated treatment well Patient left: in chair;with call bell/phone within reach;with chair alarm set;with family/visitor present Nurse Communication: Mobility status;Patient requests pain meds PT Visit Diagnosis: Muscle weakness (generalized) (M62.81);Difficulty in walking, not elsewhere classified (R26.2)    Time: 7782-4235 PT Time Calculation (min) (ACUTE ONLY): 23 min   Charges:   PT Evaluation $PT Eval Low Complexity: 1 Low PT Treatments $Gait Training: 8-22 mins        Wynn Maudlin, DPT Acute Rehabilitation Services  Office [phone] Pager [phone]  02/08/2020 2:09 PM

## 2020-02-08 NOTE — Discharge Instructions (Signed)
Elevate leg above heart 6x a day for 20minutes each Use knee immobilizer while walking until can SLR x 10 Use knee immobilizer in bed to keep knee in extension Aquacel dressing may remain in place until follow up. May shower with aquacel dressing in place. If the dressing becomes saturated or peels off, you may remove aquacel dressing. Do not remove steri-strips if they are present. Place new dressing with gauze and tape or ACE bandage which should be kept clean and dry and changed daily.  INSTRUCTIONS AFTER JOINT REPLACEMENT   o Remove items at home which could result in a fall. This includes throw rugs or furniture in walking pathways o ICE to the affected joint every three hours while awake for 30 minutes at a time, for at least the first 3-5 days, and then as needed for pain and swelling.  Continue to use ice for pain and swelling. You may notice swelling that will progress down to the foot and ankle.  This is normal after surgery.  Elevate your leg when you are not up walking on it.   o Continue to use the breathing machine you got in the hospital (incentive spirometer) which will help keep your temperature down.  It is common for your temperature to cycle up and down following surgery, especially at night when you are not up moving around and exerting yourself.  The breathing machine keeps your lungs expanded and your temperature down.   DIET:  As you were doing prior to hospitalization, we recommend a well-balanced diet.  DRESSING / WOUND CARE / SHOWERING  Keep the surgical dressing until follow up.  The dressing is water proof, so you can shower without any extra covering.  IF THE DRESSING FALLS OFF or the wound gets wet inside, change the dressing with sterile gauze.  Please use good hand washing techniques before changing the dressing.  Do not use any lotions or creams on the incision until instructed by your surgeon.    ACTIVITY  o Increase activity slowly as tolerated, but follow the  weight bearing instructions below.   o No driving for 6 weeks or until further direction given by your physician.  You cannot drive while taking narcotics.  o No lifting or carrying greater than 10 lbs. until further directed by your surgeon. o Avoid periods of inactivity such as sitting longer than an hour when not asleep. This helps prevent blood clots.  o You may return to work once you are authorized by your doctor.     WEIGHT BEARING   Weight bearing as tolerated with assist device (walker, cane, etc) as directed, use it as long as suggested by your surgeon or therapist, typically at least 4-6 weeks.   EXERCISES  Results after joint replacement surgery are often greatly improved when you follow the exercise, range of motion and muscle strengthening exercises prescribed by your doctor. Safety measures are also important to protect the joint from further injury. Any time any of these exercises cause you to have increased pain or swelling, decrease what you are doing until you are comfortable again and then slowly increase them. If you have problems or questions, call your caregiver or physical therapist for advice.   Rehabilitation is important following a joint replacement. After just a few days of immobilization, the muscles of the leg can become weakened and shrink (atrophy).  These exercises are designed to build up the tone and strength of the thigh and leg muscles and to improve motion. Often   times heat used for twenty to thirty minutes before working out will loosen up your tissues and help with improving the range of motion but do not use heat for the first two weeks following surgery (sometimes heat can increase post-operative swelling).   These exercises can be done on a training (exercise) mat, on the floor, on a table or on a bed. Use whatever works the best and is most comfortable for you.    Use music or television while you are exercising so that the exercises are a pleasant  break in your day. This will make your life better with the exercises acting as a break in your routine that you can look forward to.   Perform all exercises about fifteen times, three times per day or as directed.  You should exercise both the operative leg and the other leg as well.  Exercises include:   . Quad Sets - Tighten up the muscle on the front of the thigh (Quad) and hold for 5-10 seconds.   . Straight Leg Raises - With your knee straight (if you were given a brace, keep it on), lift the leg to 60 degrees, hold for 3 seconds, and slowly lower the leg.  Perform this exercise against resistance later as your leg gets stronger.  . Leg Slides: Lying on your back, slowly slide your foot toward your buttocks, bending your knee up off the floor (only go as far as is comfortable). Then slowly slide your foot back down until your leg is flat on the floor again.  . Angel Wings: Lying on your back spread your legs to the side as far apart as you can without causing discomfort.  . Hamstring Strength:  Lying on your back, push your heel against the floor with your leg straight by tightening up the muscles of your buttocks.  Repeat, but this time bend your knee to a comfortable angle, and push your heel against the floor.  You may put a pillow under the heel to make it more comfortable if necessary.   A rehabilitation program following joint replacement surgery can speed recovery and prevent re-injury in the future due to weakened muscles. Contact your doctor or a physical therapist for more information on knee rehabilitation.    CONSTIPATION  Constipation is defined medically as fewer than three stools per week and severe constipation as less than one stool per week.  Even if you have a regular bowel pattern at home, your normal regimen is likely to be disrupted due to multiple reasons following surgery.  Combination of anesthesia, postoperative narcotics, change in appetite and fluid intake all can  affect your bowels.   YOU MUST use at least one of the following options; they are listed in order of increasing strength to get the job done.  They are all available over the counter, and you may need to use some, POSSIBLY even all of these options:    Drink plenty of fluids (prune juice may be helpful) and high fiber foods Colace 100 mg by mouth twice a day  Senokot for constipation as directed and as needed Dulcolax (bisacodyl), take with full glass of water  Miralax (polyethylene glycol) once or twice a day as needed.  If you have tried all these things and are unable to have a bowel movement in the first 3-4 days after surgery call either your surgeon or your primary doctor.    If you experience loose stools or diarrhea, hold the medications until you stool   forms back up.  If your symptoms do not get better within 1 week or if they get worse, check with your doctor.  If you experience "the worst abdominal pain ever" or develop nausea or vomiting, please contact the office immediately for further recommendations for treatment.   ITCHING:  If you experience itching with your medications, try taking only a single pain pill, or even half a pain pill at a time.  You can also use Benadryl over the counter for itching or also to help with sleep.   TED HOSE STOCKINGS:  Use stockings on both legs until for at least 2 weeks or as directed by physician office. They may be removed at night for sleeping.  MEDICATIONS:  See your medication summary on the "After Visit Summary" that nursing will review with you.  You may have some home medications which will be placed on hold until you complete the course of blood thinner medication.  It is important for you to complete the blood thinner medication as prescribed.  PRECAUTIONS:  If you experience chest pain or shortness of breath - call 911 immediately for transfer to the hospital emergency department.   If you develop a fever greater that 101 F, purulent  drainage from wound, increased redness or drainage from wound, foul odor from the wound/dressing, or calf pain - CONTACT YOUR SURGEON.                                                   FOLLOW-UP APPOINTMENTS:  If you do not already have a post-op appointment, please call the office for an appointment to be seen by your surgeon.  Guidelines for how soon to be seen are listed in your "After Visit Summary", but are typically between 1-4 weeks after surgery.  OTHER INSTRUCTIONS:   Knee Replacement:  Do not place pillow under knee, focus on keeping the knee straight while resting. CPM instructions: 0-90 degrees, 2 hours in the morning, 2 hours in the afternoon, and 2 hours in the evening. Place foam block, curve side up under heel at all times except when in CPM or when walking.  DO NOT modify, tear, cut, or change the foam block in any way.   DENTAL ANTIBIOTICS:  In most cases prophylactic antibiotics for Dental procdeures after total joint surgery are not necessary.  Exceptions are as follows:  1. History of prior total joint infection  2. Severely immunocompromised (Organ Transplant, cancer chemotherapy, Rheumatoid biologic meds such as Humera)  3. Poorly controlled diabetes (A1C &gt; 8.0, blood glucose over 200)  If you have one of these conditions, contact your surgeon for an antibiotic prescription, prior to your dental procedure.   MAKE SURE YOU:  . Understand these instructions.  . Get help right away if you are not doing well or get worse.    Thank you for letting us be a part of your medical care team.  It is a privilege we respect greatly.  We hope these instructions will help you stay on track for a fast and full recovery!   

## 2020-02-08 NOTE — Anesthesia Procedure Notes (Signed)
Anesthesia Regional Block: Adductor canal block   Pre-Anesthetic Checklist: ,, timeout performed, Correct Patient, Correct Site, Correct Laterality, Correct Procedure, Correct Position, site marked, Risks and benefits discussed,  Surgical consent,  Pre-op evaluation,  At surgeon's request and post-op pain management  Laterality: Right  Prep: chloraprep       Needles:  Injection technique: Single-shot  Needle Type: Echogenic Needle     Needle Length: 10cm  Needle Gauge: 21     Additional Needles:   Narrative:  Start time: 02/08/2020 8:08 AM End time: 02/08/2020 8:11 AM Injection made incrementally with aspirations every 5 mL.  Performed by: Personally  Anesthesiologist: Beryle Lathe, MD  Additional Notes: No pain on injection. No increased resistance to injection. Injection made in 5cc increments. Good needle visualization. Patient tolerated the procedure well.

## 2020-02-08 NOTE — Transfer of Care (Signed)
Immediate Anesthesia Transfer of Care Note  Patient: [name]  Procedure(s) Performed: TOTAL KNEE ARTHROPLASTY (Right Knee)  Patient Location: PACU  Anesthesia Type:Regional and Spinal  Level of Consciousness: awake, alert  and oriented  Airway & Oxygen Therapy: Patient Spontanous Breathing and Patient connected to face mask oxygen  Post-op Assessment: Report given to RN and Post -op Vital signs reviewed and stable  Post vital signs: Reviewed and stable  Last Vitals:  Vitals Value Taken Time  BP 120/67 02/08/20 1116  Temp    Pulse 88 02/08/20 1117  Resp 14 02/08/20 1117  SpO2 99 % 02/08/20 1117  Vitals shown include unvalidated device data.  Last Pain:  Vitals:   02/08/20 0717  TempSrc: Oral  PainSc:       Patients Stated Pain Goal: 1 (02/08/20 0705)  Complications: No complications documented.

## 2020-02-08 NOTE — Anesthesia Procedure Notes (Signed)
Spinal  Patient location during procedure: OR End time: 02/08/2020 8:39 AM Staffing Performed: anesthesiologist and resident/CRNA  Resident/CRNA: Chalmers Iddings D, CRNA Preanesthetic Checklist Completed: patient identified, IV checked, site marked, risks and benefits discussed, surgical consent, monitors and equipment checked, pre-op evaluation and timeout performed Spinal Block Patient position: sitting Prep: Betadine Patient monitoring: heart rate, continuous pulse ox and blood pressure Approach: right paramedian Location: L2-3 Injection technique: single-shot Needle Needle type: Sprotte  Needle gauge: 24 G Needle length: 9 cm Assessment Sensory level: T6 Additional Notes Expiration date of kit checked and confirmed. Patient tolerated procedure well, without complications.

## 2020-02-09 ENCOUNTER — Encounter (HOSPITAL_COMMUNITY): Payer: Self-pay | Admitting: Specialist

## 2020-02-09 DIAGNOSIS — I10 Essential (primary) hypertension: Secondary | ICD-10-CM | POA: Diagnosis present

## 2020-02-09 DIAGNOSIS — K219 Gastro-esophageal reflux disease without esophagitis: Secondary | ICD-10-CM | POA: Diagnosis present

## 2020-02-09 DIAGNOSIS — Z8249 Family history of ischemic heart disease and other diseases of the circulatory system: Secondary | ICD-10-CM | POA: Diagnosis not present

## 2020-02-09 DIAGNOSIS — Z955 Presence of coronary angioplasty implant and graft: Secondary | ICD-10-CM | POA: Diagnosis not present

## 2020-02-09 DIAGNOSIS — I252 Old myocardial infarction: Secondary | ICD-10-CM | POA: Diagnosis not present

## 2020-02-09 DIAGNOSIS — Z96652 Presence of left artificial knee joint: Secondary | ICD-10-CM | POA: Diagnosis present

## 2020-02-09 DIAGNOSIS — M21161 Varus deformity, not elsewhere classified, right knee: Secondary | ICD-10-CM | POA: Diagnosis present

## 2020-02-09 DIAGNOSIS — M1711 Unilateral primary osteoarthritis, right knee: Secondary | ICD-10-CM | POA: Diagnosis present

## 2020-02-09 DIAGNOSIS — Z87891 Personal history of nicotine dependence: Secondary | ICD-10-CM | POA: Diagnosis not present

## 2020-02-09 DIAGNOSIS — I2510 Atherosclerotic heart disease of native coronary artery without angina pectoris: Secondary | ICD-10-CM | POA: Diagnosis present

## 2020-02-09 LAB — BASIC METABOLIC PANEL
Anion gap: 8 (ref 5–15)
BUN: 15 mg/dL (ref 6–20)
CO2: 24 mmol/L (ref 22–32)
Calcium: 8.7 mg/dL — ABNORMAL LOW (ref 8.9–10.3)
Chloride: 104 mmol/L (ref 98–111)
Creatinine, Ser: 0.99 mg/dL (ref 0.61–1.24)
GFR, Estimated: >60 mL/min (ref >60)
Glucose, Bld: 156 mg/dL — ABNORMAL HIGH (ref 70–99)
Potassium: 4 mmol/L (ref 3.5–5.1)
Sodium: 136 mmol/L (ref 135–145)

## 2020-02-09 LAB — CBC
HCT: 38.7 % — ABNORMAL LOW (ref 39.0–52.0)
Hemoglobin: 12.9 g/dL — ABNORMAL LOW (ref 13.0–17.0)
MCH: 30.2 pg (ref 26.0–34.0)
MCHC: 33.3 g/dL (ref 30.0–36.0)
MCV: 90.6 fL (ref 80.0–100.0)
Platelets: 254 10*3/uL (ref 150–400)
RBC: 4.27 MIL/uL (ref 4.22–5.81)
RDW: 12.8 % (ref 11.5–15.5)
WBC: 16.5 10*3/uL — ABNORMAL HIGH (ref 4.0–10.5)
nRBC: 0 % (ref 0.0–0.2)

## 2020-02-09 NOTE — Discharge Summary (Signed)
Physician Discharge Summary   Patient ID: [name] MRN: [id number] DOB/AGE: 56-Sep-1967 56 y.o.  Admit date: 02/08/2020 Discharge date:   Primary Diagnosis: right knee primary osteoarthritis  Admission Diagnoses:  Past Medical History:  Diagnosis Date  . Arthritis   . CAD S/P percutaneous coronary angioplasty    a. s/p NSTEMI 7/17:  EF 55%. pLAD 40%, mLAD 50%, dLCx 20%, OM2 100%,>> PCI: 2.5 x 14 mm Resolute DES to OM2   . Essential hypertension 07/06/2019  . GERD (gastroesophageal reflux disease)   . History of non-ST elevation myocardial infarction (NSTEMI)    Noted to have occluded OM 2 treated with DES stent  . Hyperlipidemia 09/17/2015  . Hypertension   . Hypertensive heart disease 09/17/2015  . Myocardial infarction (HCC)    09/2015  . Pneumonia    1998  . PONV (postoperative nausea and vomiting)    After Cath   Discharge Diagnoses:   Active Problems:   S/P TKR (total knee replacement) using cement  Estimated body mass index is 27.38 kg/m as calculated from the following:   Height as of this encounter: 5\' 8"  (1.727 m).   Weight as of this encounter: 81.7 kg.  Procedure:  Procedure(s) (LRB): TOTAL KNEE ARTHROPLASTY (Right)   Consults: None  HPI: see H&P Laboratory Data: Admission on 02/08/2020  Component Date Value Ref Range Status  . WBC 02/09/2020 16.5* 4.0 - 10.5 K/uL Final  . RBC 02/09/2020 4.27  4.22 - 5.81 MIL/uL Final  . Hemoglobin 02/09/2020 12.9* 13.0 - 17.0 g/dL Final  . HCT 14/04/2019 38.7* 39 - 52 % Final  . MCV 02/09/2020 90.6  80.0 - 100.0 fL Final  . MCH 02/09/2020 30.2  26.0 - 34.0 pg Final  . MCHC 02/09/2020 33.3  30.0 - 36.0 g/dL Final  . RDW 14/04/2019 12.8  11.5 - 15.5 % Final  . Platelets 02/09/2020 254  150 - 400 K/uL Final  . nRBC 02/09/2020 0.0  0.0 - 0.2 % Final   Performed at [facility], [address] [place], [place]  . Sodium 02/09/2020 136  135 - 145 mmol/L Final  . Potassium 02/09/2020  4.0  3.5 - 5.1 mmol/L Final  . Chloride 02/09/2020 104  98 - 111 mmol/L Final  . CO2 02/09/2020 24  22 - 32 mmol/L Final  . Glucose, Bld 02/09/2020 156* 70 - 99 mg/dL Final   Glucose reference range applies only to samples taken after fasting for at least 8 hours.  . BUN 02/09/2020 15  6 - 20 mg/dL Final  . Creatinine, Ser 02/09/2020 0.99  0.61 - 1.24 mg/dL Final  . Calcium 14/04/2019 8.7* 8.9 - 10.3 mg/dL Final  . GFR, Estimated 02/09/2020 >60  >60 mL/min Final   Comment: (NOTE) Calculated using the CKD-EPI Creatinine Equation (2021)   . Anion gap 02/09/2020 8  5 - 15 Final   Performed at [facility], [address], [place], [place]  Hospital Outpatient Visit on 02/04/2020  Component Date Value Ref Range Status  . SARS Coronavirus 2 02/04/2020 NEGATIVE  NEGATIVE Final   Comment: (NOTE) SARS-CoV-2 target nucleic acids are NOT DETECTED.  The SARS-CoV-2 RNA is generally detectable in upper and lower respiratory specimens during the acute phase of infection. Negative results do not preclude SARS-CoV-2 infection, do not rule out co-infections with other pathogens, and should not be used as the sole basis for treatment or other patient management decisions. Negative results must be combined with  clinical observations, patient history, and epidemiological information. The expected result is Negative.  Fact Sheet for Patients: HairSlick.no  Fact Sheet for Healthcare Providers: quierodirigir.com  This test is not yet approved or cleared by the [place] FDA and  has been authorized for detection and/or diagnosis of SARS-CoV-2 by FDA under an Emergency Use Authorization (EUA). This EUA will remain  in effect (meaning this test can be used) for the duration of the COVID-19 declaration under Se                          ction 564(b)(1) of the Act, 21 U.S.C. section 360bbb-3(b)(1), unless the  authorization is terminated or revoked sooner.  Performed at [facility], [address], [place], [place] [address]   Hospital Outpatient Visit on 01/26/2020  Component Date Value Ref Range Status  . aPTT 01/26/2020 31  24 - 36 seconds Final   Performed at [facility], [address] [place], [place] [address]  . Sodium 01/26/2020 140  135 - 145 mmol/L Final  . Potassium 01/26/2020 3.8  3.5 - 5.1 mmol/L Final  . Chloride 01/26/2020 105  98 - 111 mmol/L Final  . CO2 01/26/2020 27  22 - 32 mmol/L Final  . Glucose, Bld 01/26/2020 94  70 - 99 mg/dL Final   Glucose reference range applies only to samples taken after fasting for at least 8 hours.  . BUN 01/26/2020 15  6 - 20 mg/dL Final  . Creatinine, Ser 01/26/2020 0.81  0.61 - 1.24 mg/dL Final  . Calcium 82/95/6213 9.3  8.9 - 10.3 mg/dL Final  . GFR, Estimated 01/26/2020 >60  >60 mL/min Final   Comment: (NOTE) Calculated using the CKD-EPI Creatinine Equation (2021)   . Anion gap 01/26/2020 8  5 - 15 Final   Performed at [facility], [address] [place], [place] [address]  . WBC 01/26/2020 5.6  4.0 - 10.5 K/uL Final  . RBC 01/26/2020 4.97  4.22 - 5.81 MIL/uL Final  . Hemoglobin 01/26/2020 14.9  13.0 - 17.0 g/dL Final  . HCT 84/69/6295 44.5  39 - 52 % Final  . MCV 01/26/2020 89.5  80.0 - 100.0 fL Final  . MCH 01/26/2020 30.0  26.0 - 34.0 pg Final  . MCHC 01/26/2020 33.5  30.0 - 36.0 g/dL Final  . RDW 28/41/3244 12.8  11.5 - 15.5 % Final  . Platelets 01/26/2020 254  150 - 400 K/uL Final  . nRBC 01/26/2020 0.0  0.0 - 0.2 % Final   Performed at [facility], [address] [place], [place] [address]  . Prothrombin Time 01/26/2020 13.0  11.4 - 15.2 seconds Final  . INR 01/26/2020 1.0  0.8 - 1.2 Final   Comment: (NOTE) INR goal varies based on device and disease states. Performed at [facility], [address] [place], [place] [address]   .  Color, Urine 01/26/2020 YELLOW  YELLOW Final  . APPearance 01/26/2020 CLEAR  CLEAR Final  . Specific Gravity, Urine 01/26/2020 1.019  1.005 - 1.030 Final  . pH 01/26/2020 6.0  5.0 - 8.0 Final  . Glucose, UA 01/26/2020 NEGATIVE  NEGATIVE mg/dL Final  . Hgb urine dipstick 01/26/2020 NEGATIVE  NEGATIVE Final  . Bilirubin Urine 01/26/2020 NEGATIVE  NEGATIVE Final  . Ketones, ur 01/26/2020 NEGATIVE  NEGATIVE mg/dL Final  . Protein, ur 44/05/4740 NEGATIVE  NEGATIVE mg/dL Final  . Nitrite 59/56/3875 NEGATIVE  NEGATIVE Final  . Glori Luis 01/26/2020 NEGATIVE  NEGATIVE Final   Performed at [facility], [address], [place], [place] [address]  . MRSA, PCR 01/26/2020 NEGATIVE  NEGATIVE Final  . Staphylococcus aureus 01/26/2020 NEGATIVE  NEGATIVE Final   Comment: (NOTE) The Xpert SA Assay (FDA approved for NASAL specimens in patients 81 years of age and older), is one component of a comprehensive surveillance program. It is not intended to diagnose infection nor to guide or monitor treatment. Performed at [facility], [address], [place], [place] [address]      X-Rays:DG Knee 1-2 Views Right  Result Date: 02/08/2020 CLINICAL DATA:  Status post right knee replacement today. EXAM: RIGHT KNEE - 1-2 VIEW COMPARISON:  None. FINDINGS: Right knee arthroplasty is in place. No fracture or other acute abnormality is identified. Gas in the soft tissues and surgical staples noted. IMPRESSION: Status post right knee replacement.  No acute abnormality. Electronically Signed   By: Drusilla Kanner M.D.   On: 02/08/2020 11:58    EKG: Orders placed or performed in visit on 03/24/19  . EKG 12-Lead     Hospital Course: [name] is a 54 y.o. who was admitted to [facility]. They were brought to the operating room on 02/08/2020 and underwent Procedure(s): TOTAL KNEE ARTHROPLASTY.  Patient tolerated the procedure well and was later transferred to the  recovery room and then to the orthopaedic floor for postoperative care.  They were given PO and IV analgesics for pain control following their surgery.  They were given 24 hours of postoperative antibiotics of  Anti-infectives (From admission, onward)   Start     Dose/Rate Route Frequency Ordered Stop   02/08/20 1500  ceFAZolin (ANCEF) IVPB 2g/100 mL premix        2 g 200 mL/hr over 30 Minutes Intravenous Every 6 hours 02/08/20 1216 02/08/20 2206   02/08/20 0645  ceFAZolin (ANCEF) IVPB 2g/100 mL premix        2 g 200 mL/hr over 30 Minutes Intravenous On call to O.R. 02/08/20 0631 02/08/20 0840     and started on DVT prophylaxis in the form of Aspirin, TED hose and SCDs.   PT and OT were ordered for total joint protocol.  Discharge planning consulted to help with postop disposition and equipment needs.  Patient had a good night on the evening of surgery.  They started to get up OOB with therapy on day one. Continued to work with therapy into day one.  By day one, the patient had progressed with therapy and meeting their goals.  Incision was healing well.  Patient was seen in rounds and was ready to go home.   Diet: Regular diet Activity:WBAT Follow-up:in 10-14 days Disposition - Home Discharged Condition: good    Allergies as of 02/09/2020   No Known Allergies     Medication List    STOP taking these medications   atorvastatin 40 MG tablet Commonly known as: LIPITOR   ibuprofen 800 MG tablet Commonly known as: ADVIL   Mobic 7.5 MG tablet Generic drug: meloxicam   oxyCODONE-acetaminophen 5-325 MG tablet Commonly known as: PERCOCET/ROXICET Replaced by: oxyCODONE-acetaminophen 10-325 MG tablet     TAKE these medications   aspirin EC 81 MG tablet Take 1 tablet (81 mg total) by mouth 2 (two) times daily. What changed: when to take this   diclofenac Sodium 1 % Gel Commonly known as: VOLTAREN Apply 1 application topically 4 (four) times daily  as needed (pain).   docusate  sodium 100 MG capsule Commonly known as: Colace Take 1 capsule (100 mg total) by mouth 2 (two) times daily.   hydrochlorothiazide 12.5 MG tablet Commonly known as: HYDRODIURIL TAKE 1 TABLET BY MOUTH EVERY DAY   hydrocortisone cream 1 % Apply 1 application topically daily as needed for itching.   methocarbamol 500 MG tablet Commonly known as: Robaxin Take 1 tablet (500 mg total) by mouth every 8 (eight) hours as needed for muscle spasms.   metoprolol succinate 25 MG 24 hr tablet Commonly known as: TOPROL-XL TAKE 1/2 TABLET BY MOUTH EVERY DAY   multivitamin with minerals Tabs tablet Take 1 tablet by mouth daily.   NASACORT ALLERGY 24HR NA Place 1 spray into the nose daily as needed (allergies).   nitroGLYCERIN 0.4 MG SL tablet Commonly known as: NITROSTAT Place 1 tablet (0.4 mg total) under the tongue as needed. What changed: reasons to take this   oxyCODONE-acetaminophen 10-325 MG tablet Commonly known as: Percocet Take 1 tablet by mouth every 4 (four) hours as needed for pain. Replaces: oxyCODONE-acetaminophen 5-325 MG tablet   polyethylene glycol 17 g packet Commonly known as: MIRALAX / GLYCOLAX Take 17 g by mouth daily.   VITAMIN C PO Take 1 tablet by mouth daily.       Follow-up Information    [name], Jeffrey, MD Follow up in 2 week(s).   Specialty: Orthopedic Surgery Contact information: [address] [place] [id number] [phone]        [facility].   Specialty: Home Health Services Why: This agency will provide home physical therapy services. The agency will contact you prior to the first visit.  Contact information: [address] [place] [id number] [phone]               Signed: Andrez GrimeJaclyn Julee Stoll, PA-C Orthopaedic Surgery 02/09/2020, 2:28 PM

## 2020-02-09 NOTE — Progress Notes (Signed)
Physical Therapy Treatment Patient Details Name: [name] MRN: [id number] DOB: [birth year] Today's Date: 02/09/2020    History of Present Illness Patient is 54 y.o. male s/p Rt TKA on 02/08/20 with PMH significant for HTN, HLD, GERD, NSTEMI, CAD, OA, Lt TKA in 2018.    PT Comments    Pt continues to progress well with mobility.  Pt up to ambulate in hall, negotiated stairs and performed HEP with written instruction provided and reviewed.  Pt eager for dc home.   Follow Up Recommendations  Follow surgeon's recommendation for DC plan and follow-up therapies;Home health PT     Equipment Recommendations  None recommended by PT    Recommendations for Other Services       Precautions / Restrictions Precautions Precautions: Fall Restrictions Weight Bearing Restrictions: No RLE Weight Bearing: Weight bearing as tolerated Other Position/Activity Restrictions: WBAT    Mobility  Bed Mobility Overal bed mobility: Needs Assistance Bed Mobility: Supine to Sit     Supine to sit: Supervision     General bed mobility comments: Up in chair and requests back to same  Transfers Overall transfer level: Needs assistance Equipment used: Rolling walker (2 wheeled) Transfers: Sit to/from Stand Sit to Stand: Min guard;Supervision         General transfer comment: cues for LE management and use of UEs to self assist  Ambulation/Gait Ambulation/Gait assistance: Min guard;Supervision Gait Distance (Feet): 100 Feet Assistive device: Rolling walker (2 wheeled) Gait Pattern/deviations: Step-to pattern;Decreased stride length;Decreased weight shift to right Gait velocity: decr   General Gait Details: VC's for step pattern and proximity to RW, pt with tendency to step too close to RW. no overt LOB noted, assist for walker position.   Stairs Stairs: Yes Stairs assistance: Min assist Stair Management: No rails;Step to pattern;Backwards;Forwards;With walker Number of Stairs:  3 General stair comments: single step twice fwd and once bkwd.  Cues for sequence and foot/RW placement   Wheelchair Mobility    Modified Rankin (Stroke Patients Only)       Balance Overall balance assessment: Needs assistance Sitting-balance support: Feet supported Sitting balance-Leahy Scale: Good     Standing balance support: During functional activity;Bilateral upper extremity supported Standing balance-Leahy Scale: Fair                              Cognition Arousal/Alertness: Awake/alert Behavior During Therapy: WFL for tasks assessed/performed Overall Cognitive Status: Within Functional Limits for tasks assessed                                        Exercises Total Joint Exercises Ankle Circles/Pumps: AROM;Both;20 reps;Seated Quad Sets: AROM;Right;10 reps;Seated Heel Slides: AAROM;Right;15 reps;Supine Straight Leg Raises: AAROM;AROM;Right;10 reps;Supine Long Arc Quad: AAROM;Right;10 reps;Seated Knee Flexion: AAROM;Right;5 reps;Seated Goniometric ROM: -5 - 55    General Comments        Pertinent Vitals/Pain Pain Assessment: 0-10 Pain Score: 5  Pain Location: Rt knee and posterior leg Pain Descriptors / Indicators: Aching;Discomfort Pain Intervention(s): Limited activity within patient's tolerance;Monitored during session;Premedicated before session;Ice applied    Home Living                      Prior Function            PT Goals (current goals can now be found in the care plan section)  Acute Rehab PT Goals Patient Stated Goal: get back independence and return to hunting PT Goal Formulation: With patient Time For Goal Achievement: 02/15/20 Potential to Achieve Goals: Good Progress towards PT goals: Progressing toward goals    Frequency    7X/week      PT Plan Current plan remains appropriate    Co-evaluation              AM-PAC PT "6 Clicks" Mobility   Outcome Measure  Help needed turning  from your back to your side while in a flat bed without using bedrails?: None Help needed moving from lying on your back to sitting on the side of a flat bed without using bedrails?: None Help needed moving to and from a bed to a chair (including a wheelchair)?: A Little Help needed standing up from a chair using your arms (e.g., wheelchair or bedside chair)?: A Little Help needed to walk in hospital room?: A Little Help needed climbing 3-5 steps with a railing? : A Little 6 Click Score: 20    End of Session Equipment Utilized During Treatment: Gait belt Activity Tolerance: Patient tolerated treatment well Patient left: in chair;with call bell/phone within reach;with chair alarm set;with family/visitor present Nurse Communication: Mobility status PT Visit Diagnosis: Muscle weakness (generalized) (M62.81);Difficulty in walking, not elsewhere classified (R26.2)     Time: 0867-6195 PT Time Calculation (min) (ACUTE ONLY): 30 min  Charges:  $Gait Training: 8-22 mins $Therapeutic Exercise: 8-22 mins                     Mauro Kaufmann PT Acute Rehabilitation Services Pager [phone] Office [phone]    [name] 02/09/2020, 1:10 PM

## 2020-02-09 NOTE — TOC Transition Note (Signed)
Transition of Care [facility]) - CM/SW Discharge Note   Patient Details  Name: [name] MRN: [id number] Date of Birth: 1965-05-19  Transition of [facility]) CM/SW Contact:  Clearance Coots, LCSW Phone Number: 02/09/2020, 12:30 PM   Clinical Narrative:    Re: HHPT CSW discussed home health PT needs with the patient at bedside. Patient agreeable and gave CSW permission to make referrals. CSW reached out to agencies in the area. [facility] will provide PT services, pending inusrance approval. Patient notified and report understanding.  Patient confirm DME- RW and 3 in1.  Physician please put in HHPT orders before the patient discharges.   Final next level of care: Home w Home Health Services Barriers to Discharge: Barriers Resolved   Patient Goals and CMS Choice Patient states their goals for this hospitalization and ongoing recovery are:: have therapy at home      Discharge Placement                       Discharge Plan and Services                          HH Arranged: PT   Date [facility] Agency Contacted: 02/09/20 Time HH Agency Contacted: [phone] Representative spoke with at [facility] Agency: Cindie  Social Determinants of Health (SDOH) Interventions     Readmission Risk Interventions No flowsheet data found.

## 2020-02-09 NOTE — Progress Notes (Signed)
Physical Therapy Treatment Patient Details Name: [name] MRN: [id number] DOB: 21-Jan-1966 Today's Date: 02/09/2020    History of Present Illness Patient is 54 y.o. male s/p Rt TKA on 02/08/20 with PMH significant for HTN, HLD, GERD, NSTEMI, CAD, OA, Lt TKA in 2018.    PT Comments    Pt motivated and progressing well with mobility.  Pt up to ambulate increased distance in hall and performed therex program.  Will follow up with HEP and stairs when PT follow up determined (HH vs OP).   Follow Up Recommendations  Follow surgeon's recommendation for DC plan and follow-up therapies;Home health PT     Equipment Recommendations  None recommended by PT    Recommendations for Other Services       Precautions / Restrictions Precautions Precautions: Fall Restrictions Weight Bearing Restrictions: No RLE Weight Bearing: Weight bearing as tolerated Other Position/Activity Restrictions: WBAT    Mobility  Bed Mobility Overal bed mobility: Needs Assistance Bed Mobility: Supine to Sit     Supine to sit: Supervision        Transfers Overall transfer level: Needs assistance Equipment used: Rolling walker (2 wheeled) Transfers: Sit to/from Stand Sit to Stand: Min guard         General transfer comment: cues for LE management and use of UEs to self assist  Ambulation/Gait Ambulation/Gait assistance: Min guard;Supervision Gait Distance (Feet): 200 Feet Assistive device: Rolling walker (2 wheeled) Gait Pattern/deviations: Step-to pattern;Decreased stride length;Decreased weight shift to right Gait velocity: decr   General Gait Details: VC's for step pattern and proximity to RW, pt with tendency to step too close to RW. no overt LOB noted, assist for walker position.   Stairs             Wheelchair Mobility    Modified Rankin (Stroke Patients Only)       Balance Overall balance assessment: Needs assistance Sitting-balance support: Feet supported Sitting  balance-Leahy Scale: Good     Standing balance support: During functional activity;Bilateral upper extremity supported Standing balance-Leahy Scale: Fair                              Cognition Arousal/Alertness: Awake/alert Behavior During Therapy: WFL for tasks assessed/performed Overall Cognitive Status: Within Functional Limits for tasks assessed                                        Exercises Total Joint Exercises Ankle Circles/Pumps: AROM;Both;20 reps;Seated Quad Sets: AROM;Right;10 reps;Seated Heel Slides: AAROM;Right;15 reps;Supine Straight Leg Raises: AAROM;AROM;Right;10 reps;Supine Goniometric ROM: -5 - 55    General Comments        Pertinent Vitals/Pain Pain Assessment: 0-10 Pain Score: 4  Pain Location: Rt knee and posterior leg Pain Descriptors / Indicators: Aching;Discomfort Pain Intervention(s): Limited activity within patient's tolerance;Monitored during session;Premedicated before session;Ice applied    Home Living                      Prior Function            PT Goals (current goals can now be found in the care plan section) Acute Rehab PT Goals Patient Stated Goal: get back independence and return to hunting PT Goal Formulation: With patient Time For Goal Achievement: 02/15/20 Potential to Achieve Goals: Good Progress towards PT goals: Progressing toward goals  Frequency    7X/week      PT Plan Current plan remains appropriate    Co-evaluation              AM-PAC PT "6 Clicks" Mobility   Outcome Measure  Help needed turning from your back to your side while in a flat bed without using bedrails?: None Help needed moving from lying on your back to sitting on the side of a flat bed without using bedrails?: None Help needed moving to and from a bed to a chair (including a wheelchair)?: A Little Help needed standing up from a chair using your arms (e.g., wheelchair or bedside chair)?: A  Little Help needed to walk in hospital room?: A Little Help needed climbing 3-5 steps with a railing? : A Little 6 Click Score: 20    End of Session Equipment Utilized During Treatment: Gait belt Activity Tolerance: Patient tolerated treatment well Patient left: in chair;with call bell/phone within reach;with chair alarm set Nurse Communication: Mobility status PT Visit Diagnosis: Muscle weakness (generalized) (M62.81);Difficulty in walking, not elsewhere classified (R26.2)     Time: 6644-0347 PT Time Calculation (min) (ACUTE ONLY): 26 min  Charges:  $Gait Training: 8-22 mins $Therapeutic Exercise: 8-22 mins                     [name] PT Acute Rehabilitation Services Pager [phone] Office [phone]    [name] 02/09/2020, 11:17 AM

## 2020-04-13 ENCOUNTER — Other Ambulatory Visit: Payer: Self-pay | Admitting: Cardiovascular Disease

## 2020-04-23 ENCOUNTER — Other Ambulatory Visit: Payer: Self-pay | Admitting: Cardiovascular Disease

## 2020-05-28 ENCOUNTER — Other Ambulatory Visit: Payer: Self-pay | Admitting: Cardiovascular Disease

## 2020-05-30 ENCOUNTER — Other Ambulatory Visit: Payer: Self-pay | Admitting: Cardiovascular Disease

## 2020-07-07 ENCOUNTER — Other Ambulatory Visit: Payer: Self-pay | Admitting: Cardiovascular Disease

## 2020-08-20 ENCOUNTER — Other Ambulatory Visit: Payer: Self-pay | Admitting: Cardiovascular Disease

## 2020-08-23 ENCOUNTER — Other Ambulatory Visit: Payer: Self-pay | Admitting: Cardiovascular Disease

## 2020-11-19 ENCOUNTER — Other Ambulatory Visit: Payer: Self-pay | Admitting: Cardiovascular Disease

## 2020-11-28 ENCOUNTER — Other Ambulatory Visit: Payer: Self-pay | Admitting: Cardiovascular Disease

## 2020-12-03 ENCOUNTER — Other Ambulatory Visit: Payer: Self-pay | Admitting: Cardiovascular Disease

## 2020-12-12 ENCOUNTER — Other Ambulatory Visit: Payer: Self-pay | Admitting: Cardiovascular Disease

## 2020-12-21 ENCOUNTER — Other Ambulatory Visit: Payer: Self-pay | Admitting: Cardiovascular Disease

## 2021-01-01 ENCOUNTER — Other Ambulatory Visit: Payer: Self-pay | Admitting: Cardiovascular Disease

## 2021-01-20 ENCOUNTER — Other Ambulatory Visit: Payer: Self-pay | Admitting: Cardiovascular Disease

## 2021-01-21 ENCOUNTER — Telehealth (HOSPITAL_BASED_OUTPATIENT_CLINIC_OR_DEPARTMENT_OTHER): Payer: Self-pay | Admitting: Cardiovascular Disease

## 2021-01-21 MED ORDER — METOPROLOL SUCCINATE ER 25 MG PO TB24
12.5000 mg | ORAL_TABLET | Freq: Every day | ORAL | 0 refills | Status: DC
Start: 2021-01-21 — End: 2021-03-12

## 2021-01-21 MED ORDER — HYDROCHLOROTHIAZIDE 12.5 MG PO TABS
12.5000 mg | ORAL_TABLET | Freq: Every day | ORAL | 0 refills | Status: DC
Start: 2021-01-21 — End: 2021-02-07

## 2021-01-21 MED ORDER — ATORVASTATIN CALCIUM 40 MG PO TABS
40.0000 mg | ORAL_TABLET | Freq: Every day | ORAL | 0 refills | Status: DC
Start: 2021-01-21 — End: 2021-02-07

## 2021-01-21 NOTE — Telephone Encounter (Signed)
Rx(s) sent to pharmacy electronically.  

## 2021-01-21 NOTE — Telephone Encounter (Signed)
*  STAT* If patient is at the pharmacy, call can be transferred to refill team.   1. Which medications need to be refilled? (please list name of each medication and dose if known)  atorvastatin (LIPITOR) 40 MG tablet hydrochlorothiazide (HYDRODIURIL) 12.5 MG tablet metoprolol succinate (TOPROL-XL) 25 MG 24 hr tablet nitroGLYCERIN (NITROSTAT) 0.4 MG SL tablet  2. Which pharmacy/location (including street and city if local pharmacy) is medication to be sent to? [id number] - [place], [place] - [address]  3. Do they need a 30 day or 90 day supply? 90  Patient is scheduled to see Dr. Duke Salvia 04/12/21

## 2021-02-06 NOTE — Telephone Encounter (Signed)
[facility] states [facility] has only received the [facility] and not his other refills. He would like them sent in again.

## 2021-02-07 MED ORDER — HYDROCHLOROTHIAZIDE 12.5 MG PO TABS
12.5000 mg | ORAL_TABLET | Freq: Every day | ORAL | 0 refills | Status: DC
Start: 2021-02-07 — End: 2021-04-12

## 2021-02-07 MED ORDER — NITROGLYCERIN 0.4 MG SL SUBL
0.4000 mg | SUBLINGUAL_TABLET | SUBLINGUAL | 3 refills | Status: DC | PRN
Start: 2021-02-07 — End: 2023-06-02

## 2021-02-07 MED ORDER — ATORVASTATIN CALCIUM 40 MG PO TABS
40.0000 mg | ORAL_TABLET | Freq: Every day | ORAL | 0 refills | Status: DC
Start: 2021-02-07 — End: 2021-07-01

## 2021-02-07 NOTE — Addendum Note (Signed)
Addended by: Marlene Lard on: 02/07/2021 08:21 AM   Modules accepted: Orders

## 2021-03-10 ENCOUNTER — Other Ambulatory Visit (HOSPITAL_BASED_OUTPATIENT_CLINIC_OR_DEPARTMENT_OTHER): Payer: Self-pay | Admitting: Cardiovascular Disease

## 2021-04-12 ENCOUNTER — Encounter (HOSPITAL_BASED_OUTPATIENT_CLINIC_OR_DEPARTMENT_OTHER): Payer: Self-pay | Admitting: Cardiovascular Disease

## 2021-04-12 ENCOUNTER — Other Ambulatory Visit: Payer: Self-pay

## 2021-04-12 ENCOUNTER — Ambulatory Visit (INDEPENDENT_AMBULATORY_CARE_PROVIDER_SITE_OTHER): Payer: 59 | Admitting: Cardiovascular Disease

## 2021-04-12 VITALS — BP 130/82 | HR 60 | Ht 67.0 in | Wt 191.7 lb

## 2021-04-12 DIAGNOSIS — Z5181 Encounter for therapeutic drug level monitoring: Secondary | ICD-10-CM

## 2021-04-12 DIAGNOSIS — Z955 Presence of coronary angioplasty implant and graft: Secondary | ICD-10-CM

## 2021-04-12 DIAGNOSIS — I10 Essential (primary) hypertension: Secondary | ICD-10-CM

## 2021-04-12 DIAGNOSIS — R5383 Other fatigue: Secondary | ICD-10-CM | POA: Diagnosis not present

## 2021-04-12 HISTORY — DX: Other fatigue: R53.83

## 2021-04-12 MED ORDER — HYDROCHLOROTHIAZIDE 25 MG PO TABS: 25.0000 mg | ORAL_TABLET | Freq: Every day | ORAL | 3 refills | Status: DC

## 2021-04-12 NOTE — Patient Instructions (Addendum)
Medication Instructions:  INCREASE YOUR HYDROCHLOROTHIAZIDE TO 25 MG DAILY   *If you need a refill on your cardiac medications before your next appointment, please call your pharmacy*  Lab Work: CMET/TSH/TESTOSTERONE IN 1 WEEK   If you have labs (blood work) drawn today and your tests are completely normal, you will receive your results only by: [place] (if you have MyChart) OR A paper copy in the mail If you have any lab test that is abnormal or we need to change your treatment, we will call you to review the results.  Testing/Procedures: NONE  Follow-Up: At [facility], you and your health needs are our priority.  As part of our continuing mission to provide you with exceptional heart care, we have created designated Provider Care Teams.  These Care Teams include your primary Cardiologist (physician) and Advanced Practice Providers (APPs -  Physician Assistants and Nurse Practitioners) who all work together to provide you with the care you need, when you need it.  We recommend signing up for the patient portal called "MyChart".  Sign up information is provided on this After Visit Summary.  MyChart is used to connect with patients for Virtual Visits (Telemedicine).  Patients are able to view lab/test results, encounter notes, upcoming appointments, etc.  Non-urgent messages can be sent to your provider as well.   To learn more about what you can do with MyChart, go to [email or web address].    Your next appointment:   12 month(s)  The format for your next appointment:   In Person  Provider:   Skeet Latch, MD   Your physician recommends that you schedule a follow-up appointment in: APP OR PHARM D IN 2 MONTHS

## 2021-04-12 NOTE — Assessment & Plan Note (Signed)
[name] is doing very well and has no angina.  He exercises regularly without chest pain.  Continue aspirin, atorvastatin, and metoprolol.  Lipids are well controlled.  He was congratulated on his excellent diet and exercise habits.

## 2021-04-12 NOTE — Progress Notes (Signed)
Cardiology Office Note   Date:  04/12/2021   ID:  [name], DOB Dec 16, 1965, MRN [id number]    PCP:  [name], MD  Cardiologist:   [name], MD   No chief complaint on file.    History of Present Illness: [name] is a 56 y.o. male with CAD s/p DES to OM2, and hyperlipidemia who presents for follow up.  [name] was admitted 09/2015 with NSTEMI and found to have an occluded OM2.  He had a DES placed and had residual 50% LAD and 20% LCx lesions.  He was started on metoprolol, ticagrelor, aspirin and atorvastatin.  LVEF was 50-55%.  After being discharged he had some bradycardia and dizziness, so metoprolol was switched to carvedilol.  After making that change the dizziness improved.  At follow up he complained of fatigue and was switched to nebivolol.  However this was stopped due to dizziness and fatigue.  He saw Dr. [name] in clinic 04/04/16 due to chest pain.  He was started back on metoprolol and Imdur and referred for exercise Myoview 05/2016 revealed LVEF 55% and no ischemia. He was unable to tolerate Imdur 2/2 headaches.  Atorvastatin was reduced due to myalgias.  At his last appointment [name] blood pressure was elevated.  He was started on HCTZ.  He underwent right knee replacement 02/2020.  Since then he bought a [employer] and has been working out regularly.  He also gets over 10,000 steps daily.  He has no exertional chest pain or shortness of breath.  He denies lower extremity edema, orthopnea, or PND.  He limits the salt in his diet and tries to eat a healthy diet mostly at home.  He notes that he has been feeling more tired lately.  He sleeps well at night and feels well rested in the morning when he first wakes up.  However later in the day when he gets home from work he feels like he falls asleep easily.  He wonders if this is just attributable to age.  Lately when he checks his blood pressures to systolics have been in the 130s, which is an increase  from his baseline.   Past Medical History:  Diagnosis Date   Arthritis    CAD S/P percutaneous coronary angioplasty    a. s/p NSTEMI 7/17:  EF 55%. pLAD 40%, mLAD 50%, dLCx 20%, OM2 100%,>> PCI: 2.5 x 14 mm Resolute DES to OM2    Essential hypertension 07/06/2019   GERD (gastroesophageal reflux disease)    History of non-ST elevation myocardial infarction (NSTEMI)    Noted to have occluded OM 2 treated with DES stent   Hyperlipidemia 09/17/2015   Hypertension    Hypertensive heart disease 09/17/2015   Myocardial infarction (HCC)    09/2015   Pneumonia    1998   PONV (postoperative nausea and vomiting)    After Cath    Past Surgical History:  Procedure Laterality Date   BACK SURGERY     x2 2003 and 2005   CARDIAC CATHETERIZATION N/A 09/17/2015   Procedure: Left Heart Cath and Coronary Angiography;  Surgeon: [name], MD;  Location: [facility];  Service: Cardiovascular: EF 55%. pLAD 40%, mLAD 50%, dLCx 20%, OM2 100%,>> PCI: 2.5 x 14 mm Resolute DES to OM2    CARDIAC CATHETERIZATION N/A 09/17/2015   Procedure: Coronary Stent Intervention;  Surgeon: [name], MD;  Location: [facility];  Service: Cardiovascular:  PCI: 2.5  x 14 mm Resolute DES to OM2    coronary stents     KNEE ARTHROSCOPY     Left 1994   ROTATOR CUFF REPAIR Right    TOTAL KNEE ARTHROPLASTY Left 02/12/2017   Procedure: LEFT TOTAL KNEE ARTHROPLASTY;  Surgeon: [name], MD;  Location: [facility];  Service: Orthopedics;  Laterality: Left;  120 mins   TOTAL KNEE ARTHROPLASTY Right 02/08/2020   Procedure: TOTAL KNEE ARTHROPLASTY;  Surgeon: [name], MD;  Location: [facility];  Service: Orthopedics;  Laterality: Right;  2.5 hrs   TRANSTHORACIC ECHOCARDIOGRAM  09/17/2015   Normal LV size and thickness. EF 50-55%. No regional wall motion abnormality. GR 1 DD.     Current Outpatient Medications  Medication Sig Dispense Refill   Ascorbic Acid (VITAMIN C PO) Take 1 tablet by mouth daily.       aspirin EC 81 MG tablet Take 1 tablet (81 mg total) by mouth 2 (two) times daily. 40 tablet 1   atorvastatin (LIPITOR) 40 MG tablet Take 1 tablet (40 mg total) by mouth daily. Must keep appointment in February for future refills 90 tablet 0   diclofenac Sodium (VOLTAREN) 1 % GEL Apply 1 application topically 4 (four) times daily as needed (pain).     hydrochlorothiazide (HYDRODIURIL) 12.5 MG tablet Take 1 tablet (12.5 mg total) by mouth daily. Must keep appointment in February for future refills 90 tablet 0   hydrocortisone cream 1 % Apply 1 application topically daily as needed for itching.     methocarbamol (ROBAXIN) 500 MG tablet Take 1 tablet (500 mg total) by mouth every 8 (eight) hours as needed for muscle spasms. 40 tablet 1   metoprolol succinate (TOPROL-XL) 25 MG 24 hr tablet Take 0.5 tablets (12.5 mg total) by mouth daily. Patient must keep appointment in February to ensure any further refills 15 tablet 0   Multiple Vitamin (MULTIVITAMIN WITH MINERALS) TABS tablet Take 1 tablet by mouth daily.     nitroGLYCERIN (NITROSTAT) 0.4 MG SL tablet Place 1 tablet (0.4 mg total) under the tongue as needed. Must keep appointment in February for future refills 25 tablet 3   oxyCODONE-acetaminophen (PERCOCET) 10-325 MG tablet Take 1 tablet by mouth every 4 (four) hours as needed for pain. 40 tablet 0   polyethylene glycol (MIRALAX / GLYCOLAX) 17 g packet Take 17 g by mouth daily. 14 each 0   Triamcinolone Acetonide (NASACORT ALLERGY 24HR NA) Place 1 spray into the nose daily as needed (allergies).     No current facility-administered medications for this visit.    Allergies:   Patient has no known allergies.    Social History:  The patient  reports that he has never smoked. He has quit using smokeless tobacco. He reports that he does not drink alcohol and does not use drugs.   Family History:  The patient's family history includes Heart attack in his brother and father.    ROS:  Please see the  history of present illness.   Otherwise, review of systems are positive for fatigue.   All other systems are reviewed and negative.    PHYSICAL EXAM: VS:  BP 130/82 (BP Location: Left Arm, Patient Position: Sitting, Cuff Size: Large)    Pulse 60    Ht 5\' 7"  (1.702 m)    Wt 191 lb 11.2 oz (87 kg)    BMI 30.02 kg/m  , BMI Body mass index is 30.02 kg/m. GENERAL:  Well appearing HEENT: Pupils equal round and reactive, fundi  not visualized, oral mucosa unremarkable NECK:  No jugular venous distention, waveform within normal limits, carotid upstroke brisk and symmetric, no bruits, no thyromegaly LUNGS:  Clear to auscultation bilaterally HEART:  RRR.  PMI not displaced or sustained,S1 and S2 within normal limits, no S3, no S4, no clicks, no rubs, no murmurs ABD:  Flat, positive bowel sounds normal in frequency in pitch, no bruits, no rebound, no guarding, no midline pulsatile mass, no hepatomegaly, no splenomegaly EXT:  2 plus pulses throughout, no edema, no cyanosis no clubbing SKIN:  No rashes no nodules NEURO:  Cranial nerves II through XII grossly intact, motor grossly intact throughout PSYCH:  Cognitively intact, oriented to person place and time  EKG:  EKG is ordered today. The ekg ordered 11/30/15 demonstrateSinus bradycardia. 57 bpm. 10/14/17: Sinus rhythm.  Rate 70 bpm.  Prior inferior infarct.  03/24/19: Sinus rhythm.  Rate 64 bpm.  04/12/21: Sinus rhythm.  Rate 60 bpm  Exercise Myoveiw 05/09/16: The left ventricular ejection fraction is normal (55-65%). Nuclear stress EF: 55%. There was no ST segment deviation noted during stress. The study is normal. This is a low risk study.   Normal stress nuclear study with no ischemia or infarction; EF 55 with normal wall motion. 09/18/15:    Prox LAD to Mid LAD lesion, 40% stenosed. Mid LAD to Dist LAD lesion, 50% stenosed. 2nd Mrg lesion, 100% stenosed. Post intervention, there is a 0% residual stenosis. The left ventricular systolic function  is normal. Dist Cx lesion, 20% stenosed.   Transthoracic Echocardiography 09/17/15 Study Conclusions   - Left ventricle: The cavity size was normal. Wall thickness was   normal. Systolic function was normal. The estimated ejection   fraction was in the range of 50% to 55%. Wall motion was normal;   there were no regional wall motion abnormalities. Doppler   parameters are consistent with abnormal left ventricular   relaxation (grade 1 diastolic dysfunction).  Recent Labs: No results found for requested labs within last 8760 hours.   02/16/16: Sodium 143, potassium 4.6, BUN 18, creatinine 0.93 WBC 5.8, hemoglobin 15.1, hematocrit 45.4, platelets 300 Total cholesterol 129, trig glycerides 55, HDL 51, HDL 67 AST 20, ALT 24 TSH 1.18  Lipid Panel    Component Value Date/Time   CHOL 105 (L) 10/09/2015 0742   TRIG 87 10/09/2015 0742   HDL 37 (L) 10/09/2015 0742   CHOLHDL 2.8 10/09/2015 0742   VLDL 17 10/09/2015 0742   LDLCALC 51 10/09/2015 0742    05/2019: Total cholesterol 111,LDL 47, HDL 40, triglycerides 118  05/13/20: Total cholesterol 112, triglycerides 157, HDL 42, LDL 39   Wt Readings from Last 3 Encounters:  02/08/20 180 lb 1 oz (81.7 kg)  01/26/20 180 lb 1 oz (81.7 kg)  07/06/19 182 lb (82.6 kg)      ASSESSMENT AND PLAN:  Status post coronary artery stent placement Mr. Scoggins is doing very well and has no angina.  He exercises regularly without chest pain.  Continue aspirin, atorvastatin, and metoprolol.  Lipids are well controlled.  He was congratulated on his excellent diet and exercise habits.  Essential hypertension Blood pressure very slightly above goal.  He notes that his systolic has been consistently in the 130s at home lately and this has been an increase for him.  We will increase hydrochlorothiazide to 25 mg.  Check a comprehensive metabolic panel in a week.  Continue metoprolol.  Hyperlipidemia with target low density lipoprotein (LDL) cholesterol  less than 70 mg/dL Lipids  are well controlled.  His LDL was 39 on 05/2020.  He is about to have his labs rechecked with his work next month.  We will check a CMP to assess his liver function.  Triglycerides were very mildly elevated at 157.  He is going to work on limiting carbohydrate intake.  He does not drink alcohol.  Fatigue He notes that he has been more tired lately.  He sleeps well overnight and does not feel tired when he first wakes up in the morning.  I do not think he has OSA.  He denies any new hot or cold intolerance.  We will check a TSH.  We will also check testosterone levels.  Recommended that he find a primary care physician for routine screening as well as to discuss this further.   Current medicines are reviewed at length with the patient today.  The patient does not have concerns regarding medicines.  The following changes have been made: add hctz  Labs/ tests ordered today include:   No orders of the defined types were placed in this encounter.    Disposition:   FU with Margee Trentham C. Oval Linsey, MD, [facility] in 1 year.  PharmD or APP in 2 months.   Signed, Louann Hopson C. Oval Linsey, MD, [facility]  04/12/2021 7:51 AM    [place]

## 2021-04-12 NOTE — Assessment & Plan Note (Signed)
Lipids are well controlled.  His LDL was 39 on 05/2020.  He is about to have his labs rechecked with his work next month.  We will check a CMP to assess his liver function.  Triglycerides were very mildly elevated at 157.  He is going to work on limiting carbohydrate intake.  He does not drink alcohol.

## 2021-04-12 NOTE — Addendum Note (Signed)
Addended by: Burnell Blanks on: 04/12/2021 09:37 AM   Modules accepted: Orders

## 2021-04-12 NOTE — Assessment & Plan Note (Signed)
He notes that he has been more tired lately.  He sleeps well overnight and does not feel tired when he first wakes up in the morning.  I do not think he has OSA.  He denies any new hot or cold intolerance.  We will check a TSH.  We will also check testosterone levels.  Recommended that he find a primary care physician for routine screening as well as to discuss this further.

## 2021-04-12 NOTE — Assessment & Plan Note (Signed)
Blood pressure very slightly above goal.  He notes that his systolic has been consistently in the 130s at home lately and this has been an increase for him.  We will increase hydrochlorothiazide to 25 mg.  Check a comprehensive metabolic panel in a week.  Continue metoprolol.

## 2021-05-04 LAB — COMPREHENSIVE METABOLIC PANEL
ALT: 31 IU/L (ref 0–44)
AST: 25 IU/L (ref 0–40)
Albumin/Globulin Ratio: 1.6 (ref 1.2–2.2)
Albumin: 4.4 g/dL (ref 3.8–4.9)
Alkaline Phosphatase: 92 IU/L (ref 44–121)
BUN/Creatinine Ratio: 22 — ABNORMAL HIGH (ref 9–20)
BUN: 20 mg/dL (ref 6–24)
Bilirubin Total: 0.5 mg/dL (ref 0.0–1.2)
CO2: 24 mmol/L (ref 20–29)
Calcium: 9.6 mg/dL (ref 8.7–10.2)
Chloride: 103 mmol/L (ref 96–106)
Creatinine, Ser: 0.93 mg/dL (ref 0.76–1.27)
Globulin, Total: 2.8 g/dL (ref 1.5–4.5)
Glucose: 95 mg/dL (ref 70–99)
Potassium: 4.3 mmol/L (ref 3.5–5.2)
Sodium: 144 mmol/L (ref 134–144)
Total Protein: 7.2 g/dL (ref 6.0–8.5)
eGFR: 95 mL/min/{1.73_m2} (ref >59)

## 2021-05-04 LAB — TESTOSTERONE: Testosterone: 187 ng/dL — ABNORMAL LOW (ref 264–916)

## 2021-05-04 LAB — TSH: TSH: 1.42 u[IU]/mL (ref 0.450–4.500)

## 2021-05-24 ENCOUNTER — Other Ambulatory Visit (HOSPITAL_BASED_OUTPATIENT_CLINIC_OR_DEPARTMENT_OTHER): Payer: Self-pay | Admitting: Cardiovascular Disease

## 2021-06-10 ENCOUNTER — Ambulatory Visit: Payer: 59

## 2021-06-30 ENCOUNTER — Other Ambulatory Visit (HOSPITAL_BASED_OUTPATIENT_CLINIC_OR_DEPARTMENT_OTHER): Payer: Self-pay | Admitting: Cardiovascular Disease

## 2021-07-01 NOTE — Telephone Encounter (Signed)
Rx(s) sent to pharmacy electronically.  

## 2022-04-17 ENCOUNTER — Other Ambulatory Visit (HOSPITAL_BASED_OUTPATIENT_CLINIC_OR_DEPARTMENT_OTHER): Payer: Self-pay | Admitting: Cardiovascular Disease

## 2022-04-17 ENCOUNTER — Telehealth (HOSPITAL_BASED_OUTPATIENT_CLINIC_OR_DEPARTMENT_OTHER): Payer: Self-pay | Admitting: Cardiovascular Disease

## 2022-04-17 NOTE — Telephone Encounter (Signed)
Please call pt to schedule for overdue 1 year follow-up with Dr. Oval Linsey or APP for refills. Thank you!

## 2022-04-17 NOTE — Telephone Encounter (Signed)
Called to discuss scheduling the overdue appointment with Dr. Oval Linsey / APP for medication refills----no answer and vm is full

## 2022-04-21 NOTE — Telephone Encounter (Signed)
Left message for patient to call and schedule follow up office visit with Dr. Oval Linsey / APP for medication refills

## 2022-04-28 NOTE — Telephone Encounter (Signed)
Rx request sent to pharmacy.  

## 2022-04-28 NOTE — Telephone Encounter (Signed)
Scheduled 05/20/22 with Laurann Montana, NP

## 2022-05-05 ENCOUNTER — Other Ambulatory Visit (HOSPITAL_BASED_OUTPATIENT_CLINIC_OR_DEPARTMENT_OTHER): Payer: Self-pay | Admitting: Cardiovascular Disease

## 2022-05-05 NOTE — Telephone Encounter (Signed)
Rx request sent to pharmacy.  

## 2022-05-12 ENCOUNTER — Telehealth (HOSPITAL_BASED_OUTPATIENT_CLINIC_OR_DEPARTMENT_OTHER): Payer: Self-pay | Admitting: Family

## 2022-05-12 NOTE — Telephone Encounter (Signed)
Spoke with patient regarding new appointment date and time with Laurann Montana, NP---Monday 06/09/22 at 3:35 pm (rescheduled from 05/20/22 at 3:10 pm--provider not in the office.  Will mail information to patient and he voiced his hunderstanding

## 2022-05-20 ENCOUNTER — Ambulatory Visit (HOSPITAL_BASED_OUTPATIENT_CLINIC_OR_DEPARTMENT_OTHER): Payer: 59 | Admitting: Family

## 2022-06-09 ENCOUNTER — Ambulatory Visit (HOSPITAL_BASED_OUTPATIENT_CLINIC_OR_DEPARTMENT_OTHER): Payer: 59 | Admitting: Family

## 2022-06-27 ENCOUNTER — Encounter (HOSPITAL_BASED_OUTPATIENT_CLINIC_OR_DEPARTMENT_OTHER): Payer: Self-pay | Admitting: Family

## 2022-06-27 ENCOUNTER — Ambulatory Visit (INDEPENDENT_AMBULATORY_CARE_PROVIDER_SITE_OTHER): Payer: 59 | Admitting: Family

## 2022-06-27 VITALS — BP 124/78 | HR 66 | Ht 67.0 in | Wt 191.0 lb

## 2022-06-27 DIAGNOSIS — I25118 Atherosclerotic heart disease of native coronary artery with other forms of angina pectoris: Secondary | ICD-10-CM

## 2022-06-27 DIAGNOSIS — R5383 Other fatigue: Secondary | ICD-10-CM

## 2022-06-27 DIAGNOSIS — I10 Essential (primary) hypertension: Secondary | ICD-10-CM

## 2022-06-27 DIAGNOSIS — E785 Hyperlipidemia, unspecified: Secondary | ICD-10-CM | POA: Diagnosis not present

## 2022-06-27 MED ORDER — ATORVASTATIN CALCIUM 80 MG PO TABS: 80.0000 mg | ORAL_TABLET | Freq: Every day | ORAL | 3 refills | Status: DC

## 2022-06-27 NOTE — Patient Instructions (Addendum)
Medication Instructions:  Your physician has recommended you make the following change in your medication:  INCREASE Atorvastatin to  daily  *If you need a refill on your cardiac medications before your next appointment, please call your pharmacy*   Lab Work: Your physician recommends that you return for lab work in 6-8 weeks: fasting lipid panel, CMP, vitamin D, vitamin B12 You may come to the...   [facility] (3rd floor) [address], [place], [place] [address]  Open: 8am-Noon and 1pm-4:30pm  Please ring the doorbell on the small table when you exit the elevator and the Lab Tech will come get you  [facility] at [facility] [address], [place], [place] [address] Open: 8am-1pm, then 2pm-4:30pm   [facility]- Please see attached locations sheet stapled to your lab work with address and hours.    If you have labs (blood work) drawn today and your tests are completely normal, you will receive your results only by: MyChart Message (if you have MyChart) OR A paper copy in the mail If you have any lab test that is abnormal or we need to change your treatment, we will call you to review the results.   Testing/Procedures: Your EKG today showed normal sinus rhythm which is a great result!   Follow-Up: At [facility], you and your health needs are our priority.  As part of our continuing mission to provide you with exceptional heart care, we have created designated Provider Care Teams.  These Care Teams include your primary Cardiologist (physician) and Advanced Practice Providers (APPs -  Physician Assistants and Nurse Practitioners) who all work together to provide you with the care you need, when you need it.  We recommend signing up for the patient portal called "MyChart".  Sign up information is provided on this After Visit Summary.  MyChart is used to connect with patients for Virtual Visits (Telemedicine).  Patients are  able to view lab/test results, encounter notes, upcoming appointments, etc.  Non-urgent messages can be sent to your provider as well.   To learn more about what you can do with MyChart, go to [email or web address].    Your next appointment:   1 year(s)  Provider:   [name], MD or Gillian Shields, NP    Other Instructions Can do a nurse visit one afternoon to check on BP monitor if you would like

## 2022-06-27 NOTE — Progress Notes (Unsigned)
Office Visit    Patient Name: [name] Date of Encounter: 06/27/2022  PCP:  [name], MD   [facility]  Cardiologist:  [name], MD  Advanced Practice Provider:  No care team member to display Electrophysiologist:  None      Chief Complaint    [name] is a 57 y.o. male presents today for CAD follow up   Past Medical History    Past Medical History:  Diagnosis Date   Arthritis    CAD S/P percutaneous coronary angioplasty    a. s/p NSTEMI 7/17:  EF 55%. pLAD 40%, mLAD 50%, dLCx 20%, OM2 100%,>> PCI: 2.5 x 14 mm Resolute DES to OM2    Essential hypertension 07/06/2019   Fatigue 04/12/2021   GERD (gastroesophageal reflux disease)    History of non-ST elevation myocardial infarction (NSTEMI)    Noted to have occluded OM 2 treated with DES stent   Hyperlipidemia 09/17/2015   Hypertension    Hypertensive heart disease 09/17/2015   Myocardial infarction    09/2015   Pneumonia    1998   PONV (postoperative nausea and vomiting)    After Cath   Past Surgical History:  Procedure Laterality Date   BACK SURGERY     x2 2003 and 2005   CARDIAC CATHETERIZATION N/A 09/17/2015   Procedure: Left Heart Cath and Coronary Angiography;  Surgeon: [name], MD;  Location: [facility];  Service: Cardiovascular: EF 55%. pLAD 40%, mLAD 50%, dLCx 20%, OM2 100%,>> PCI: 2.5 x 14 mm Resolute DES to OM2    CARDIAC CATHETERIZATION N/A 09/17/2015   Procedure: Coronary Stent Intervention;  Surgeon: [name], MD;  Location: [facility];  Service: Cardiovascular:  PCI: 2.5 x 14 mm Resolute DES to OM2    coronary stents     KNEE ARTHROSCOPY     Left 1994   ROTATOR CUFF REPAIR Right    TOTAL KNEE ARTHROPLASTY Left 02/12/2017   Procedure: LEFT TOTAL KNEE ARTHROPLASTY;  Surgeon: [name], MD;  Location: [facility];  Service: Orthopedics;  Laterality: Left;  120 mins   TOTAL KNEE ARTHROPLASTY Right 02/08/2020   Procedure: TOTAL KNEE  ARTHROPLASTY;  Surgeon: [name], MD;  Location: [facility];  Service: Orthopedics;  Laterality: Right;  2.5 hrs   TRANSTHORACIC ECHOCARDIOGRAM  09/17/2015   Normal LV size and thickness. EF 50-55%. No regional wall motion abnormality. GR 1 DD.    Allergies  No Known Allergies  History of Present Illness    [name] is a 57 y.o. male with a hx of CAD s/p DES-OM2, HLD last seen 04/12/21 by Dr. Duke Salvia.  Admitted 09/2015 with NSTEMI found to have occluded OM2 treated with DES with residual 50% LAD, 20% LCx lesion. LVEF 50-55%. Metoprolol later changed to carvedilol due to bradycardia, dizziness. Later changed to Nebivolol due to fatigue and beta blocker eventually discontinued. Metoprolol later resumed. Myoview 05/2016 no ischemia, LVEF 55%. Unable to tolerate Imdur due to headache. Atorvastatin reduced due to myalgia. HCTZ later added for BP control.   Last seen 04/12/21 with BP slightly above goal, HCTZ increased ot 25mg  QD. TSH, testosterone levels checked due to fatigue and encouraged to establish with PCP.   He presents today for follow up independently. Monitoring blood pressure at home with readings most often 130s/90s. No episodes of hypotension. BP when checked at work physical recently was 128/76. Notes occasional chest pains which are "sharp". These occur  at rest, self resolve within seconds, and do not require nitroglycerin. Reports no shortness of breath nor dyspnea on exertion. Reports no chest pain, pressure, or tightness. No orthopnea, PND. Mild edema by the end of the day after working a 10 hour shift. Reports no palpitations.    EKGs/Labs/Other Studies Reviewed:   The following studies were reviewed today: Cardiac Studies & Procedures   CARDIAC CATHETERIZATION  CARDIAC CATHETERIZATION 09/17/2015  Narrative  Prox LAD to Mid LAD lesion, 40% stenosed.  Mid LAD to Dist LAD lesion, 50% stenosed.  2nd Mrg lesion, 100% stenosed. Post intervention, there is a 0% residual  stenosis.  The left ventricular systolic function is normal.  Dist Cx lesion, 20% stenosed.  Preserved global LV function with an ejection fraction of 55% and evidence for very mild mid inferior hypocontractility.  Two-vessel coronary obstructive disease with 40% proximal LAD stenosis in the region of the first diagonal vessel, and 50% LAD narrowing just after the second diagonal takeoff; moderate size left circumflex coronary artery with 20% narrowing in the OM1 vessel, total occlusion of the OM 2 vessel with TIMI 0 flow antegrade, and 20% distal circumflex stenosis; and a large dominant RCA with very faint collateralization from the PLA vessel to the distal circumflex marginal 2 vessel.  Successful PCI to the totally occluded circumflex OM 2 vessel with ultimate insertion of a 2.514 mm Resolute DES stent postdilated to 2.71 mm with 100% occlusion reduced to 0% and resumption of brisk TIMI-3 flow.  RECOMMENDATION: The patient was switched to brilinta from Plavix in light of presenting with acute coronary syndrome/NSTEMI, he will continue dual antiplatelet therapy for minimum of a year.  Medical therapy for mild to moderate concomitant CAD.  Findings Coronary Findings Diagnostic  Dominance: Right  Left Main The left main coronary artery was angiographically normal and bifurcated into the LAD and left circumflex vessel  Left Anterior Descending The LAD was a moderate size vessel that had 40% smooth narrowing in the region of the takeoff of the first diagonal vessel.  There was 50% narrowing after the takeoff of the second diagonal vessel.  The LAD extended to the LV apex.  Left Circumflex The left circumflex vessel was moderate size vessel that gave rise to one proximal marginal branch that had smooth 20% proximal stenosis.  The second marginal branch was totally occluded and was a nubbin shortly after the takeoff of the AV groove circumflex and first marginal branch.  There was very faint  distal collateralization of this vessel.  The the distal RCA.  Second Obtuse Marginal Branch Collaterals 2nd Mrg filled by collaterals from 3rd RPL.  Right Coronary Artery The RCA was a dominant vessel that gave rise to the PDA and inferior LV branch and posterolateral coronary artery.  There was very faint collateralization to the distal OM 2 branch which was occluded from the circumflex.  Intervention  2nd Mrg lesion PCI There is no pre-interventional antegrade distal flow (TIMI 0). Pre-stent angioplasty was performed. A drug-eluting stent was placed. The strut is apposed. Post-stent angioplasty was performed. The post-interventional distal flow is normal (TIMI 3). The intervention was successful. No complications occurred at this lesion. Supplies used: BALLN EMERGE MR 2.5X12; STENT RESOLUTE INTEG 2.5X14; BALLN [place] EMERGE MR [id number] There is a 0% residual stenosis post intervention.   STRESS TESTS  MYOCARDIAL PERFUSION IMAGING 05/09/2016  Narrative  The left ventricular ejection fraction is normal (55-65%).  Nuclear stress EF: 55%.  There was no ST segment deviation noted during  stress.  The study is normal.  This is a low risk study.  Normal stress nuclear study with no ischemia or infarction; EF 55 with normal wall motion.   ECHOCARDIOGRAM  ECHOCARDIOGRAM COMPLETE 09/17/2015  Narrative *[place]* *[facility]* [address] [place], [place] [address] [phone]  ------------------------------------------------------------------- Transthoracic Echocardiography  Patient:    Lamarius, Dirr MR #:       [id number] Study Date: 09/17/2015 Gender:     M Age:        50 Height:     170.2 cm Weight:     81.4 kg BSA:        1.98 m^2 Pt. Status: Room:       3W32C  ADMITTING    Armanda Magic, MD ATTENDING    Zadie Rhine [id number] SONOGRAPHER  Delcie Roch, RDCS, CCT PERFORMING   [facility], Inpatient ORDERING     Azeem,  Amir  cc:  ------------------------------------------------------------------- LV EF: 50% -   55%  ------------------------------------------------------------------- Indications:      Chest pain 786.51.  ------------------------------------------------------------------- History:   Risk factors:  NSTEMI. Hypertension. Dyslipidemia.  ------------------------------------------------------------------- Study Conclusions  - Left ventricle: The cavity size was normal. Wall thickness was normal. Systolic function was normal. The estimated ejection fraction was in the range of 50% to 55%. Wall motion was normal; there were no regional wall motion abnormalities. Doppler parameters are consistent with abnormal left ventricular relaxation (grade 1 diastolic dysfunction).  ------------------------------------------------------------------- Study data:  No prior study was available for comparison.  Study status:  Routine.  Procedure:  The patient reported no pain pre or post test. Transthoracic echocardiography. Image quality was adequate.  Study completion:  There were no complications. Transthoracic echocardiography.  M-mode, complete 2D, spectral Doppler, and color Doppler.  Birthdate:  Patient birthdate: 1965/09/27.  Age:  Patient is 57 yr old.  Sex:  Gender: male. BMI: 28.1 kg/m^2.  Blood pressure:     122/71  Patient status: Inpatient.  Study date:  Study date: 09/17/2015. Study time: 02:02 PM.  Location:  Bedside.  -------------------------------------------------------------------  ------------------------------------------------------------------- Left ventricle:  The cavity size was normal. Wall thickness was normal. Systolic function was normal. The estimated ejection fraction was in the range of 50% to 55%. Wall motion was normal; there were no regional wall motion abnormalities. Doppler parameters are consistent with abnormal left ventricular relaxation (grade 1 diastolic  dysfunction).  ------------------------------------------------------------------- Aortic valve:   Trileaflet; normal thickness leaflets. Mobility was not restricted.  Doppler:  Transvalvular velocity was within the normal range. There was no stenosis. There was no regurgitation.  ------------------------------------------------------------------- Aorta:  Aortic root: The aortic root was normal in size.  ------------------------------------------------------------------- Mitral valve:   Structurally normal valve.   Mobility was not restricted.  Doppler:  Transvalvular velocity was within the normal range. There was no evidence for stenosis. There was no regurgitation.    Peak gradient (D): 4 mm Hg.  ------------------------------------------------------------------- Left atrium:  The atrium was normal in size.  ------------------------------------------------------------------- Right ventricle:  The cavity size was normal. Wall thickness was normal. Systolic function was normal.  ------------------------------------------------------------------- Pulmonic valve:   Poorly visualized.  Structurally normal valve. Cusp separation was normal.  Doppler:  Transvalvular velocity was within the normal range. There was no evidence for stenosis. There was trivial regurgitation.  ------------------------------------------------------------------- Tricuspid valve:   Structurally normal valve.    Doppler: Transvalvular velocity was within the normal range. There was no regurgitation.  ------------------------------------------------------------------- Pulmonary artery:   The main pulmonary artery was normal-sized. Systolic  pressure was within the normal range.  ------------------------------------------------------------------- Right atrium:  The atrium was normal in size.  ------------------------------------------------------------------- Pericardium:  There was no pericardial  effusion.  ------------------------------------------------------------------- Systemic veins: Inferior vena cava: The vessel was normal in size. The respirophasic diameter changes were in the normal range (>= 50%), consistent with normal central venous pressure.  ------------------------------------------------------------------- Measurements  Left ventricle                         Value        Reference LV ID, ED, PLAX chordal                44.4  mm     43 - 52 LV ID, ES, PLAX chordal                34.4  mm     23 - 38 LV fx shortening, PLAX chordal (L)     23    %      >=29 LV PW thickness, ED                    7.86  mm     --------- IVS/LV PW ratio, ED                    1.15         <=1.3 LV e&', lateral                         10.6  cm/s   --------- LV E/e&', lateral                       10           --------- LV e&', medial                          9.79  cm/s   --------- LV E/e&', medial                        10.83        --------- LV e&', average                         10.2  cm/s   --------- LV E/e&', average                       10.4         ---------  Ventricular septum                     Value        Reference IVS thickness, ED                      9.06  mm     ---------  LVOT                                   Value        Reference LVOT ID, S                             22    mm     --------- LVOT area  3.8   cm^2   ---------  Aorta                                  Value        Reference Aortic root ID, ED                     34    mm     ---------  Left atrium                            Value        Reference LA ID, A-P, ES                         32    mm     --------- LA ID/bsa, A-P                         1.62  cm/m^2 <=2.2 LA volume, S                           59    ml     --------- LA volume/bsa, S                       29.8  ml/m^2 --------- LA volume, ES, 1-p A4C                 57.5  ml     --------- LA volume/bsa, ES,  1-p A4C             29    ml/m^2 --------- LA volume, ES, 1-p A2C                 56.1  ml     --------- LA volume/bsa, ES, 1-p A2C             28.3  ml/m^2 ---------  Mitral valve                           Value        Reference Mitral E-wave peak velocity            106   cm/s   --------- Mitral A-wave peak velocity            76.4  cm/s   --------- Mitral deceleration time               169   ms     150 - 230 Mitral peak gradient, D                4     mm Hg  --------- Mitral E/A ratio, peak                 1.4          ---------  Systemic veins                         Value        Reference Estimated CVP                          3  mm Hg  ---------  Right ventricle                        Value        Reference TAPSE                                  22.2  mm     --------- RV s&', lateral, S                      12.4  cm/s   ---------  Legend: (L)  and  (H)  mark values outside specified reference range.  ------------------------------------------------------------------- Prepared and Electronically Authenticated by  Donato Schultz, M.D. 2017-07-10T15:01:47             EKG:  EKG is  ordered today.  The ekg ordered today demonstrates NSR 66 bpm with noa cute ST/T wave changes.   Recent Labs: No results found for requested labs within last 365 days.  Recent Lipid Panel    Component Value Date/Time   CHOL 105 (L) 10/09/2015 0742   TRIG 87 10/09/2015 0742   HDL 37 (L) 10/09/2015 0742   CHOLHDL 2.8 10/09/2015 0742   VLDL 17 10/09/2015 0742   LDLCALC 51 10/09/2015 0742     Home Medications   Current Meds  Medication Sig   Ascorbic Acid (VITAMIN C PO) Take 1 tablet by mouth daily.    aspirin EC 81 MG tablet Take 81 mg by mouth daily. Swallow whole.   atorvastatin (LIPITOR) 40 MG tablet TAKE 1 TABLET BY MOUTH EVERY DAY   hydrochlorothiazide (HYDRODIURIL) 25 MG tablet TAKE 1 TABLET (25 MG TOTAL) BY MOUTH DAILY.   hydrocortisone cream 1 % Apply 1 application topically  daily as needed for itching.   metoprolol succinate (TOPROL-XL) 25 MG 24 hr tablet Take 0.5 tablets (12.5 mg total) by mouth daily. Please keep your upcoming appointment for refills.   Multiple Vitamin (MULTIVITAMIN WITH MINERALS) TABS tablet Take 1 tablet by mouth daily.   nitroGLYCERIN (NITROSTAT) 0.4 MG SL tablet Place 1 tablet (0.4 mg total) under the tongue as needed. Must keep appointment in February for future refills   Triamcinolone Acetonide (NASACORT ALLERGY 24HR NA) Place 1 spray into the nose daily as needed (allergies).     Review of Systems      All other systems reviewed and are otherwise negative except as noted above.  Physical Exam    VS:  BP 128/66   Pulse 66   Ht 5\' 7"  (1.702 m)   Wt 191 lb (86.6 kg)   BMI 29.91 kg/m  , BMI Body mass index is 29.91 kg/m.  Wt Readings from Last 3 Encounters:  06/27/22 191 lb (86.6 kg)  04/12/21 191 lb 11.2 oz (87 kg)  02/08/20 180 lb 1 oz (81.7 kg)     GEN: Well nourished, well developed, in no acute distress. HEENT: normal. Neck: Supple, no JVD, carotid bruits, or masses. Cardiac: RRR, no murmurs, rubs, or gallops. No clubbing, cyanosis, edema.  Radials/PT 2+ and equal bilaterally.  Respiratory:  Respirations regular and unlabored, clear to auscultation bilaterally. GI: Soft, nontender, nondistended. MS: No deformity or atrophy. Skin: Warm and dry, no rash. Neuro:  Strength and sensation are intact. Psych: Normal affect.  Assessment & Plan    CAD - Stable with no anginal symptoms. No indication for ischemic evaluation.  GDMT  atorvastatin, aspirin, metoprolol, PRN nitroglycerin. Heart healthy diet and regular cardiovascular exercise encouraged.    HTN - BP well controlled. Continue current antihypertensive regimen.  Elevated at home but suspect BP cuff inaccurate, he will bring to follow up visit to ensure accurage.   HLD, LDL goal <70 - Not at goal. Increase Atorvastatin to 80mg  QD. FLP/CMP in 6 weeks.   Fatigue - No  evidence of OSA> Update vitamin D, B12 level for further evaluation. Encouraged to establish with PCP.        Disposition: Follow up in 1 year(s) with [name], MD or APP.  Signed, Alver Sorrow, NP 06/27/2022, 3:34 PM [facility]

## 2022-07-02 ENCOUNTER — Encounter (HOSPITAL_BASED_OUTPATIENT_CLINIC_OR_DEPARTMENT_OTHER): Payer: Self-pay | Admitting: Family

## 2022-07-25 ENCOUNTER — Other Ambulatory Visit (HOSPITAL_BASED_OUTPATIENT_CLINIC_OR_DEPARTMENT_OTHER): Payer: Self-pay | Admitting: Cardiovascular Disease

## 2022-07-25 NOTE — Telephone Encounter (Signed)
Rx request sent to pharmacy.  

## 2022-07-29 ENCOUNTER — Other Ambulatory Visit (HOSPITAL_BASED_OUTPATIENT_CLINIC_OR_DEPARTMENT_OTHER): Payer: Self-pay | Admitting: Cardiovascular Disease

## 2022-10-07 ENCOUNTER — Other Ambulatory Visit (HOSPITAL_BASED_OUTPATIENT_CLINIC_OR_DEPARTMENT_OTHER): Payer: Self-pay | Admitting: Cardiovascular Disease

## 2022-10-30 ENCOUNTER — Other Ambulatory Visit (HOSPITAL_BASED_OUTPATIENT_CLINIC_OR_DEPARTMENT_OTHER): Payer: Self-pay | Admitting: Cardiovascular Disease

## 2022-12-16 ENCOUNTER — Other Ambulatory Visit (HOSPITAL_BASED_OUTPATIENT_CLINIC_OR_DEPARTMENT_OTHER): Payer: Self-pay | Admitting: Cardiovascular Disease

## 2023-01-05 ENCOUNTER — Other Ambulatory Visit (HOSPITAL_BASED_OUTPATIENT_CLINIC_OR_DEPARTMENT_OTHER): Payer: Self-pay | Admitting: Cardiovascular Disease

## 2023-02-10 ENCOUNTER — Other Ambulatory Visit (HOSPITAL_BASED_OUTPATIENT_CLINIC_OR_DEPARTMENT_OTHER): Payer: Self-pay | Admitting: Cardiovascular Disease

## 2023-03-27 DIAGNOSIS — M7662 Achilles tendinitis, left leg: Secondary | ICD-10-CM | POA: Insufficient documentation

## 2023-03-27 DIAGNOSIS — M12579 Traumatic arthropathy, unspecified ankle and foot: Secondary | ICD-10-CM | POA: Insufficient documentation

## 2023-03-27 DIAGNOSIS — M19079 Primary osteoarthritis, unspecified ankle and foot: Secondary | ICD-10-CM | POA: Insufficient documentation

## 2023-04-10 DIAGNOSIS — G5603 Carpal tunnel syndrome, bilateral upper limbs: Secondary | ICD-10-CM | POA: Insufficient documentation

## 2023-04-10 DIAGNOSIS — M65341 Trigger finger, right ring finger: Secondary | ICD-10-CM | POA: Insufficient documentation

## 2023-04-12 ENCOUNTER — Other Ambulatory Visit (HOSPITAL_BASED_OUTPATIENT_CLINIC_OR_DEPARTMENT_OTHER): Payer: Self-pay | Admitting: Cardiovascular Disease

## 2023-05-13 ENCOUNTER — Other Ambulatory Visit (HOSPITAL_BASED_OUTPATIENT_CLINIC_OR_DEPARTMENT_OTHER): Payer: Self-pay | Admitting: Family

## 2023-05-13 DIAGNOSIS — I10 Essential (primary) hypertension: Secondary | ICD-10-CM

## 2023-05-13 DIAGNOSIS — I25118 Atherosclerotic heart disease of native coronary artery with other forms of angina pectoris: Secondary | ICD-10-CM

## 2023-05-13 DIAGNOSIS — E785 Hyperlipidemia, unspecified: Secondary | ICD-10-CM

## 2023-05-13 DIAGNOSIS — R5383 Other fatigue: Secondary | ICD-10-CM

## 2023-06-02 ENCOUNTER — Encounter (HOSPITAL_BASED_OUTPATIENT_CLINIC_OR_DEPARTMENT_OTHER): Payer: Self-pay | Admitting: Family

## 2023-06-02 ENCOUNTER — Ambulatory Visit (HOSPITAL_BASED_OUTPATIENT_CLINIC_OR_DEPARTMENT_OTHER): Payer: 59 | Admitting: Family

## 2023-06-02 VITALS — BP 140/90 | HR 70 | Ht 67.0 in | Wt 197.1 lb

## 2023-06-02 DIAGNOSIS — E785 Hyperlipidemia, unspecified: Secondary | ICD-10-CM

## 2023-06-02 DIAGNOSIS — I10 Essential (primary) hypertension: Secondary | ICD-10-CM

## 2023-06-02 DIAGNOSIS — Z01810 Encounter for preprocedural cardiovascular examination: Secondary | ICD-10-CM

## 2023-06-02 DIAGNOSIS — I25118 Atherosclerotic heart disease of native coronary artery with other forms of angina pectoris: Secondary | ICD-10-CM | POA: Diagnosis not present

## 2023-06-02 MED ORDER — NITROGLYCERIN 0.4 MG SL SUBL: 0.4000 mg | SUBLINGUAL_TABLET | SUBLINGUAL | 3 refills | Status: AC | PRN

## 2023-06-02 MED ORDER — HYDROCHLOROTHIAZIDE 25 MG PO TABS: 25.0000 mg | ORAL_TABLET | Freq: Every day | ORAL | 3 refills | Status: AC

## 2023-06-02 MED ORDER — CARVEDILOL 6.25 MG PO TABS: 6.2500 mg | ORAL_TABLET | Freq: Two times a day (BID) | ORAL | 3 refills | Status: DC

## 2023-06-02 MED ORDER — ATORVASTATIN CALCIUM 80 MG PO TABS: 80.0000 mg | ORAL_TABLET | Freq: Every day | ORAL | 3 refills | Status: AC

## 2023-06-02 NOTE — Progress Notes (Signed)
 Cardiology Office Note:  .   Date:  06/02/2023  ID:  [name], DOB 1965/04/04, MRN [id number] PCP: [name], MD  [facility] Providers Cardiologist:  [name], MD    History of Present Illness: [name] is a 58 y.o. male  with a hx of CAD s/p DES-OM2 09/2015, HLD, HTN.    Admitted 09/2015 with NSTEMI found to have occluded OM2 treated with DES with residual 50% LAD, 20% LCx lesion. LVEF 50-55%. Metoprolol later changed to carvedilol due to bradycardia, dizziness. Later changed to Nebivolol due to fatigue and beta blocker eventually discontinued. Metoprolol later resumed. Myoview 05/2016 no ischemia, LVEF 55%. Unable to tolerate Imdur due to headache. Atorvastatin reduced due to myalgia. HCTZ later added for BP control.    Seen 04/12/21 with BP slightly above goal, HCTZ increased to 25mg  QD. TSH, testosterone levels checked due to fatigue and encouraged to establish with PCP. At visit 06/27/22 BP was at goal but LDL elevated, Atorvastatin increased to 80mg  daily.    He presents today for follow up independently. He is walking 86578 steps a day and working out 3x a week. He does check BP at home with readings high 130's/ 90's. He did take a whole metoprolol tablet the day of his annual physical and his BP was 139/88. He has noticed in the last couple of months having dyspnea with chest tightness spells at rest and has to step outside to catch his breath. He says that it feels less significant than prior heart attack without the chest pain and describes it more like discomfort. Denies taking nitroglycerin. He has no CP with strenuous activity and is able to work out with no CP. Discussed that if imaging needed in future, PET scan best choice. Symptoms overall atypical for angina, reassurance provided.. Reports no dyspnea on exertion. Reports no edema, orthopnea, PND. Reports no palpitations.   ROS: Please see the history of present illness.    All other systems  reviewed and are negative.   Studies Reviewed: Marland Kitchen   EKG Interpretation Date/Time:  Tuesday June 02 2023 15:07:14 EDT Ventricular Rate:  70 PR Interval:  160 QRS Duration:  92 QT Interval:  392 QTC Calculation: 423 R Axis:   -33  Text Interpretation: Normal sinus rhythm Left axis deviation No acute changes Confirmed by Gillian Shields ([id number]) on 06/02/2023 3:42:37 PM    Cardiac Studies & Procedures   ______________________________________________________________________________________________ CARDIAC CATHETERIZATION  CARDIAC CATHETERIZATION 09/17/2015  Narrative  Prox LAD to Mid LAD lesion, 40% stenosed.  Mid LAD to Dist LAD lesion, 50% stenosed.  2nd Mrg lesion, 100% stenosed. Post intervention, there is a 0% residual stenosis.  The left ventricular systolic function is normal.  Dist Cx lesion, 20% stenosed.  Preserved global LV function with an ejection fraction of 55% and evidence for very mild mid inferior hypocontractility.  Two-vessel coronary obstructive disease with 40% proximal LAD stenosis in the region of the first diagonal vessel, and 50% LAD narrowing just after the second diagonal takeoff; moderate size left circumflex coronary artery with 20% narrowing in the OM1 vessel, total occlusion of the OM 2 vessel with TIMI 0 flow antegrade, and 20% distal circumflex stenosis; and a large dominant RCA with very faint collateralization from the PLA vessel to the distal circumflex marginal 2 vessel.  Successful PCI to the totally occluded circumflex OM 2 vessel with ultimate insertion of a 2.514 mm Resolute DES stent postdilated to 2.71 mm with 100% occlusion reduced  to 0% and resumption of brisk TIMI-3 flow.  RECOMMENDATION: The patient was switched to brilinta from Plavix in light of presenting with acute coronary syndrome/NSTEMI, he will continue dual antiplatelet therapy for minimum of a year.  Medical therapy for mild to moderate concomitant CAD.  Findings Coronary  Findings Diagnostic  Dominance: Right  Left Main The left main coronary artery was angiographically normal and bifurcated into the LAD and left circumflex vessel  Left Anterior Descending The LAD was a moderate size vessel that had 40% smooth narrowing in the region of the takeoff of the first diagonal vessel.  There was 50% narrowing after the takeoff of the second diagonal vessel.  The LAD extended to the LV apex.  Left Circumflex The left circumflex vessel was moderate size vessel that gave rise to one proximal marginal branch that had smooth 20% proximal stenosis.  The second marginal branch was totally occluded and was a nubbin shortly after the takeoff of the AV groove circumflex and first marginal branch.  There was very faint distal collateralization of this vessel.  The the distal RCA.  Second Obtuse Marginal Branch Collaterals 2nd Mrg filled by collaterals from 3rd RPL.  Right Coronary Artery The RCA was a dominant vessel that gave rise to the PDA and inferior LV branch and posterolateral coronary artery.  There was very faint collateralization to the distal OM 2 branch which was occluded from the circumflex.  Intervention  2nd Mrg lesion PCI There is no pre-interventional antegrade distal flow (TIMI 0). Pre-stent angioplasty was performed. A drug-eluting stent was placed. The strut is apposed. Post-stent angioplasty was performed. The post-interventional distal flow is normal (TIMI 3). The intervention was successful. No complications occurred at this lesion. Supplies used: BALLN EMERGE MR 2.5X12; STENT RESOLUTE INTEG 2.5X14; BALLN [place] EMERGE MR [id number] There is a 0% residual stenosis post intervention.   STRESS TESTS  MYOCARDIAL PERFUSION IMAGING 05/09/2016  Narrative  The left ventricular ejection fraction is normal (55-65%).  Nuclear stress EF: 55%.  There was no ST segment deviation noted during stress.  The study is normal.  This is a low risk study.  Normal  stress nuclear study with no ischemia or infarction; EF 55 with normal wall motion.   ECHOCARDIOGRAM  ECHOCARDIOGRAM COMPLETE 09/17/2015  Narrative *[facility]* *[facility]* [address] [place], [place] [address] [phone]  ------------------------------------------------------------------- Transthoracic Echocardiography  Patient:    Jarman, Litton MR #:       [id number] Study Date: 09/17/2015 Gender:     M Age:        50 Height:     170.2 cm Weight:     81.4 kg BSA:        1.98 m^2 Pt. Status: Room:       3W32C  ADMITTING    Armanda Magic, MD ATTENDING    Zadie Rhine [id number] SONOGRAPHER  Delcie Roch, RDCS, CCT PERFORMING   [facility], Inpatient ORDERING     Azeem, Amir  cc:  ------------------------------------------------------------------- LV EF: 50% -   55%  ------------------------------------------------------------------- Indications:      Chest pain 786.51.  ------------------------------------------------------------------- History:   Risk factors:  NSTEMI. Hypertension. Dyslipidemia.  ------------------------------------------------------------------- Study Conclusions  - Left ventricle: The cavity size was normal. Wall thickness was normal. Systolic function was normal. The estimated ejection fraction was in the range of 50% to 55%. Wall motion was normal; there were no regional wall motion abnormalities. Doppler parameters are consistent with abnormal left ventricular relaxation (grade 1 diastolic  dysfunction).  ------------------------------------------------------------------- Study data:  No prior study was available for comparison.  Study status:  Routine.  Procedure:  The patient reported no pain pre or post test. Transthoracic echocardiography. Image quality was adequate.  Study completion:  There were no complications. Transthoracic echocardiography.  M-mode, complete 2D, spectral Doppler, and color Doppler.   Birthdate:  Patient birthdate: Jul 11, 1965.  Age:  Patient is 58 yr old.  Sex:  Gender: male. BMI: 28.1 kg/m^2.  Blood pressure:     122/71  Patient status: Inpatient.  Study date:  Study date: 09/17/2015. Study time: 02:02 PM.  Location:  Bedside.  -------------------------------------------------------------------  ------------------------------------------------------------------- Left ventricle:  The cavity size was normal. Wall thickness was normal. Systolic function was normal. The estimated ejection fraction was in the range of 50% to 55%. Wall motion was normal; there were no regional wall motion abnormalities. Doppler parameters are consistent with abnormal left ventricular relaxation (grade 1 diastolic dysfunction).  ------------------------------------------------------------------- Aortic valve:   Trileaflet; normal thickness leaflets. Mobility was not restricted.  Doppler:  Transvalvular velocity was within the normal range. There was no stenosis. There was no regurgitation.  ------------------------------------------------------------------- Aorta:  Aortic root: The aortic root was normal in size.  ------------------------------------------------------------------- Mitral valve:   Structurally normal valve.   Mobility was not restricted.  Doppler:  Transvalvular velocity was within the normal range. There was no evidence for stenosis. There was no regurgitation.    Peak gradient (D): 4 mm Hg.  ------------------------------------------------------------------- Left atrium:  The atrium was normal in size.  ------------------------------------------------------------------- Right ventricle:  The cavity size was normal. Wall thickness was normal. Systolic function was normal.  ------------------------------------------------------------------- Pulmonic valve:   Poorly visualized.  Structurally normal valve. Cusp separation was normal.  Doppler:  Transvalvular velocity  was within the normal range. There was no evidence for stenosis. There was trivial regurgitation.  ------------------------------------------------------------------- Tricuspid valve:   Structurally normal valve.    Doppler: Transvalvular velocity was within the normal range. There was no regurgitation.  ------------------------------------------------------------------- Pulmonary artery:   The main pulmonary artery was normal-sized. Systolic pressure was within the normal range.  ------------------------------------------------------------------- Right atrium:  The atrium was normal in size.  ------------------------------------------------------------------- Pericardium:  There was no pericardial effusion.  ------------------------------------------------------------------- Systemic veins: Inferior vena cava: The vessel was normal in size. The respirophasic diameter changes were in the normal range (>= 50%), consistent with normal central venous pressure.  ------------------------------------------------------------------- Measurements  Left ventricle                         Value        Reference LV ID, ED, PLAX chordal                44.4  mm     43 - 52 LV ID, ES, PLAX chordal                34.4  mm     23 - 38 LV fx shortening, PLAX chordal (L)     23    %      >=29 LV PW thickness, ED                    7.86  mm     --------- IVS/LV PW ratio, ED                    1.15         <=1.3 LV e&', lateral  10.6  cm/s   --------- LV E/e&', lateral                       10           --------- LV e&', medial                          9.79  cm/s   --------- LV E/e&', medial                        10.83        --------- LV e&', average                         10.2  cm/s   --------- LV E/e&', average                       10.4         ---------  Ventricular septum                     Value        Reference IVS thickness, ED                      9.06  mm      ---------  LVOT                                   Value        Reference LVOT ID, S                             22    mm     --------- LVOT area                              3.8   cm^2   ---------  Aorta                                  Value        Reference Aortic root ID, ED                     34    mm     ---------  Left atrium                            Value        Reference LA ID, A-P, ES                         32    mm     --------- LA ID/bsa, A-P                         1.62  cm/m^2 <=2.2 LA volume, S                           59    ml     --------- LA volume/bsa, S  29.8  ml/m^2 --------- LA volume, ES, 1-p A4C                 57.5  ml     --------- LA volume/bsa, ES, 1-p A4C             29    ml/m^2 --------- LA volume, ES, 1-p A2C                 56.1  ml     --------- LA volume/bsa, ES, 1-p A2C             28.3  ml/m^2 ---------  Mitral valve                           Value        Reference Mitral E-wave peak velocity            106   cm/s   --------- Mitral A-wave peak velocity            76.4  cm/s   --------- Mitral deceleration time               169   ms     150 - 230 Mitral peak gradient, D                4     mm Hg  --------- Mitral E/A ratio, peak                 1.4          ---------  Systemic veins                         Value        Reference Estimated CVP                          3     mm Hg  ---------  Right ventricle                        Value        Reference TAPSE                                  22.2  mm     --------- RV s&', lateral, S                      12.4  cm/s   ---------  Legend: (L)  and  (H)  mark values outside specified reference range.  ------------------------------------------------------------------- Prepared and Electronically Authenticated by  Donato Schultz, M.D. 2017-07-10T15:01:47          ______________________________________________________________________________________________      Risk  Assessment/Calculations:            Physical Exam:   VS:  BP (!) 140/90 (BP Location: Right Arm, Patient Position: Sitting, Cuff Size: Normal)   Pulse 70   Ht 5\' 7"  (1.702 m)   Wt 197 lb 1.6 oz (89.4 kg)   BMI 30.87 kg/m    Wt Readings from Last 3 Encounters:  06/02/23 197 lb 1.6 oz (89.4 kg)  06/27/22 191 lb (86.6 kg)  04/12/21 191 lb 11.2 oz (87 kg)    GEN: Well nourished, well developed in no acute distress NECK: No JVD; No  carotid bruits CARDIAC: RRR, no murmurs, rubs, gallops RESPIRATORY:  Clear to auscultation without rales, wheezing or rhonchi  ABDOMEN: Soft, non-tender, non-distended EXTREMITIES:  No edema; No deformity   ASSESSMENT AND PLAN: .    CAD - EKG today NSR no acute ST/T wave changes. GDMT: Aspirin 81 mg, Carvedilol 6.25 mg BID, atorvastatin 80 mg, and nitroglycerin SL PRN. Reports rare dyspnea at rest but continues to exercise 3x per week without issue. Overall atypical for angina. We did discuss if symptoms progress, could consider cardiac PET scan   Preop - pending carpel tunnel surgery. He is able to stop aspirin 81 mg 7 days prior to surgery if needed. Per AHA/ACC guidelines, he is deemed acceptable risk for the planned procedure without additional cardiovascular testing. Will route to surgical team so they are aware.    HTN - BP at home has remained elevated at home in the high 130's/90. Stop Metoprolol 25 mg. Start Carvedilol 6.25 mg BID and continue hydrochlorothiazide 25 mg. Discussed to monitor BP at home at least 2 hours after medications and sitting for 5-10 minutes.  HLD, LDL goal <70 - Annual work physical LDL: 49, Triglycerides: 163, and Total Cholesterol: 123. Continue atorvastatin 80 mg. Recommend aiming for 150 minutes of moderate intensity activity per week and following a heart healthy diet.        Dispo: Follow up in 6 months.   Signed, Alver Sorrow, NP

## 2023-06-02 NOTE — Patient Instructions (Signed)
 Medication Instructions:  Your physician has recommended you make the following change in your medication:   Stop: Metoprolol   Start: Carvedilol 6.25mg  twice daily   Follow-Up: At [facility], you and your health needs are our priority.  As part of our continuing mission to provide you with exceptional heart care, we have created designated Provider Care Teams.  These Care Teams include your primary Cardiologist (physician) and Advanced Practice Providers (APPs -  Physician Assistants and Nurse Practitioners) who all work together to provide you with the care you need, when you need it.  We recommend signing up for the patient portal called "MyChart".  Sign up information is provided on this After Visit Summary.  MyChart is used to connect with patients for Virtual Visits (Telemedicine).  Patients are able to view lab/test results, encounter notes, upcoming appointments, etc.  Non-urgent messages can be sent to your provider as well.   To learn more about what you can do with MyChart, go to [email or web address].    Your next appointment:   6 months with Dr. Duke Salvia, Eligha Bridegroom, NP or Gillian Shields, NP   Other Instructions We will check in on Blood pressure in two weeks

## 2023-06-05 ENCOUNTER — Telehealth: Payer: Self-pay

## 2023-06-05 NOTE — Telephone Encounter (Signed)
     Primary Cardiologist: [name], MD  Chart reviewed as part of pre-operative protocol coverage. Given past medical history and time since last visit, based on ACC/AHA guidelines, [name] would be at acceptable risk for the planned procedure without further cardiovascular testing.   His RCRI is low risk, 0.9% risk of major cardiac event.  He is able to complete greater than 4 METS of physical activity.  If necessary his aspirin may be held for 5 to 7 days prior to his surgery.  Please resume as soon as hemostasis is achieved.  I will route this recommendation to the requesting party via Epic fax function and remove from pre-op pool.  Please call with questions.  Thomasene Ripple. Mahalia Dykes NP-C     06/05/2023, 2:37 PM [facility] [address] Office [phone] Fax [phone]

## 2023-06-05 NOTE — Telephone Encounter (Signed)
   Pre-operative Risk Assessment    Patient Name: [name]  DOB: 07-28-1965 MRN: [id number]   Date of last office visit: 06/02/23 [facility] Date of next office visit: 11/16/23 [facility]   Request for Surgical Clearance    Procedure:   Right carpal tunnel release, right ring fingerA1 pulley release with  local and MAC anesthesia.   Date of Surgery:  Clearance 06/18/23                                 Surgeon:  Dr. Waylan Rocher Surgeon's Group or Practice Name:  [facility]  Phone number:  [phone] Fax number:  [phone]   Type of Clearance Requested:   - Medical  - Pharmacy:  Hold Aspirin 81mg  - **Please give aspirin instructions**   Type of Anesthesia:  Not Indicated   Additional requests/questions:    Elyse Jarvis   06/05/2023, 2:10 PM

## 2023-06-16 ENCOUNTER — Encounter (HOSPITAL_BASED_OUTPATIENT_CLINIC_OR_DEPARTMENT_OTHER): Payer: Self-pay

## 2023-07-06 ENCOUNTER — Telehealth: Payer: Self-pay | Admitting: Cardiovascular Disease

## 2023-07-06 DIAGNOSIS — I25118 Atherosclerotic heart disease of native coronary artery with other forms of angina pectoris: Secondary | ICD-10-CM

## 2023-07-06 DIAGNOSIS — I10 Essential (primary) hypertension: Secondary | ICD-10-CM

## 2023-07-06 NOTE — Telephone Encounter (Signed)
 Returned call to patient, no answer,  unable to leave message due to full mailbox.   Per last office visit with Neomi Banks, NP   "Overall atypical for angina. We did discuss if symptoms progress, could consider cardiac PET scan"   Test ordered, directions sent to pt via mychart.

## 2023-07-06 NOTE — Telephone Encounter (Signed)
 Patient calling about the test that was discuss at his last office visit. Calling to go ahead and set that up. Please advise

## 2023-08-04 MED ORDER — CARVEDILOL 12.5 MG PO TABS: 12.5000 mg | ORAL_TABLET | Freq: Two times a day (BID) | ORAL | 3 refills | Status: AC

## 2023-08-04 NOTE — Telephone Encounter (Signed)
 Bp log for review

## 2023-08-04 NOTE — Telephone Encounter (Signed)
 Average BP 138/85. Recommend increase Carvedilol  from 6.25 to 12.5mg  BID to get BP to goal <130/80.  Cardiac PET has been scheduled for 09/30/23 at 6:40am. For questions about your test or how to prepare for your test, please call Cardiac Imaging Nurse Navigators  [phone]. For scheduling concerns please call [phone].  Typically nurse navigators reach out a few days before the study to go over directions again.   Deneise Getty S Deiona Hooper, NP

## 2023-08-04 NOTE — Addendum Note (Signed)
 Addended by: Guss Legacy on: 08/04/2023 10:58 AM   Modules accepted: Orders

## 2023-09-28 ENCOUNTER — Encounter (HOSPITAL_COMMUNITY): Payer: Self-pay

## 2023-09-28 ENCOUNTER — Other Ambulatory Visit (HOSPITAL_COMMUNITY): Payer: Self-pay | Admitting: *Deleted

## 2023-09-28 DIAGNOSIS — I25118 Atherosclerotic heart disease of native coronary artery with other forms of angina pectoris: Secondary | ICD-10-CM

## 2023-09-29 ENCOUNTER — Encounter (HOSPITAL_BASED_OUTPATIENT_CLINIC_OR_DEPARTMENT_OTHER): Payer: Self-pay | Admitting: Cardiovascular Disease

## 2023-09-30 ENCOUNTER — Ambulatory Visit (HOSPITAL_BASED_OUTPATIENT_CLINIC_OR_DEPARTMENT_OTHER): Payer: Self-pay | Admitting: Family

## 2023-09-30 ENCOUNTER — Ambulatory Visit (HOSPITAL_COMMUNITY)
Admission: RE | Admit: 2023-09-30 | Discharge: 2023-09-30 | Disposition: A | Discharge status: Home / Self Care | Source: Ambulatory Visit | Attending: Family | Admitting: Family

## 2023-09-30 DIAGNOSIS — I10 Essential (primary) hypertension: Secondary | ICD-10-CM | POA: Insufficient documentation

## 2023-09-30 DIAGNOSIS — I25118 Atherosclerotic heart disease of native coronary artery with other forms of angina pectoris: Secondary | ICD-10-CM | POA: Diagnosis not present

## 2023-09-30 LAB — NM PET CT CARDIAC PERFUSION MULTI W/ABSOLUTE BLOODFLOW
LV dias vol: 98 mL (ref 62–150)
LV sys vol: 45 mL (ref 4.2–5.8)
MBFR: 3.27
Nuc Rest EF: 54 %
Nuc Stress EF: 67 %
Rest MBF: 0.82 ml/g/min
Rest Nuclear Isotope Dose: 23.6 mCi
ST Depression (mm): 0 mm
Stress MBF: 2.68 ml/g/min
Stress Nuclear Isotope Dose: 23.5 mCi

## 2023-09-30 MED ORDER — CAFFEINE CITRATE BASE COMPONENT 10 MG/ML IV SOLN
INTRAVENOUS | Status: AC
  Filled 2023-09-30: qty 3

## 2023-09-30 MED ORDER — RUBIDIUM RB82 GENERATOR (RUBYFILL)
23.4800 | PACK | Freq: Once | INTRAVENOUS | Status: AC
  Administered 2023-09-30: 23.48 via INTRAVENOUS

## 2023-09-30 MED ORDER — RUBIDIUM RB82 GENERATOR (RUBYFILL)
23.5500 | PACK | Freq: Once | INTRAVENOUS | Status: AC
  Administered 2023-09-30: 23.55 via INTRAVENOUS

## 2023-09-30 MED ORDER — REGADENOSON 0.4 MG/5ML IV SOLN
INTRAVENOUS | Status: AC
  Filled 2023-09-30: qty 5

## 2023-09-30 MED ORDER — REGADENOSON 0.4 MG/5ML IV SOLN
0.4000 mg | Freq: Once | INTRAVENOUS | Status: AC
  Administered 2023-09-30: 0.4 mg via INTRAVENOUS

## 2023-09-30 NOTE — Progress Notes (Signed)
 Pt. Tolerated lexi scan well.

## 2023-10-01 NOTE — Telephone Encounter (Signed)
 Please advise further concerning fatty liver.

## 2023-10-28 DIAGNOSIS — S61412A Laceration without foreign body of left hand, initial encounter: Secondary | ICD-10-CM | POA: Insufficient documentation

## 2023-10-30 ENCOUNTER — Ambulatory Visit: Admitting: Student in an Organized Health Care Education/Training Program

## 2023-10-30 ENCOUNTER — Encounter (HOSPITAL_BASED_OUTPATIENT_CLINIC_OR_DEPARTMENT_OTHER): Payer: Self-pay | Admitting: Family

## 2023-10-30 ENCOUNTER — Ambulatory Visit (INDEPENDENT_AMBULATORY_CARE_PROVIDER_SITE_OTHER): Admitting: Family

## 2023-10-30 VITALS — BP 124/84 | HR 86 | Temp 97.9°F | Resp 16 | Ht 67.0 in | Wt 195.0 lb

## 2023-10-30 DIAGNOSIS — I10 Essential (primary) hypertension: Secondary | ICD-10-CM

## 2023-10-30 DIAGNOSIS — E785 Hyperlipidemia, unspecified: Secondary | ICD-10-CM | POA: Diagnosis not present

## 2023-10-30 DIAGNOSIS — I25118 Atherosclerotic heart disease of native coronary artery with other forms of angina pectoris: Secondary | ICD-10-CM | POA: Diagnosis not present

## 2023-10-30 NOTE — Progress Notes (Signed)
 Cardiology Office Note:  .   Date:  10/30/2023  ID:  [name], DOB 02/18/1966, MRN [id number] PCP: Maree Isles, MD  [facility] Providers Cardiologist:  Annabella Scarce, MD    History of Present Illness: [name] is a 58 y.o. male  with a hx of CAD s/p DES-OM2 09/2015, HLD, HTN.    Admitted 09/2015 with NSTEMI found to have occluded OM2 treated with DES with residual 50% LAD, 20% LCx lesion. LVEF 50-55%. Metoprolol  later changed to carvedilol  due to bradycardia, dizziness. Later changed to Nebivolol due to fatigue and beta blocker eventually discontinued. Metoprolol  later resumed. Myoview  05/2016 no ischemia, LVEF 55%. Unable to tolerate Imdur  due to headache. Atorvastatin  reduced due to myalgia. HCTZ later added for BP control.    Seen 04/12/21 with BP slightly above goal, HCTZ increased to 25mg  QD. TSH, testosterone levels checked due to fatigue and encouraged to establish with PCP. At visit 06/27/22 BP was at goal but LDL elevated, Atorvastatin  increased to 80mg  daily.    At visit 06/02/23 Metoprolol  stopped and transitioned to Coreg  6.25mg  BID for BP control. Due to chest pain, cardiac PET 09/30/23 no ischemia/infarction, rest EF 54% and stress EF 67%.   Presents today for follow up. Intended on retiring month but has elected to decrease to 30h per week and finish out the year. Reports no shortness of breath nor dyspnea on exertion. Reports no chest pain, pressure, or tightness. No edema, orthopnea, PND. Reports no palpitations.  Reviewed cardiac PET, reassured by result. Very active working out 3x per week.  Labs with PCP 10/14/23: TSH 1.61 Total cholesterol 155, triglycerides 168, hdl 40, ldl 86 Creatinine 8.98, gfr 85, Na 144, K 3.8, Ca 9.6, AST 34, ALT 45, alk phos 97 Hb 15.1, Hct 45.5  ROS: Please see the history of present illness.    All other systems reviewed and are negative.   Studies Reviewed: .        Cardiac Studies & Procedures    ______________________________________________________________________________________________ CARDIAC CATHETERIZATION  CARDIAC CATHETERIZATION 09/17/2015  Conclusion  Prox LAD to Mid LAD lesion, 40% stenosed.  Mid LAD to Dist LAD lesion, 50% stenosed.  2nd Mrg lesion, 100% stenosed. Post intervention, there is a 0% residual stenosis.  The left ventricular systolic function is normal.  Dist Cx lesion, 20% stenosed.  Preserved global LV function with an ejection fraction of 55% and evidence for very mild mid inferior hypocontractility.  Two-vessel coronary obstructive disease with 40% proximal LAD stenosis in the region of the first diagonal vessel, and 50% LAD narrowing just after the second diagonal takeoff; moderate size left circumflex coronary artery with 20% narrowing in the OM1 vessel, total occlusion of the OM 2 vessel with TIMI 0 flow antegrade, and 20% distal circumflex stenosis; and a large dominant RCA with very faint collateralization from the PLA vessel to the distal circumflex marginal 2 vessel.  Successful PCI to the totally occluded circumflex OM 2 vessel with ultimate insertion of a 2.514 mm Resolute DES stent postdilated to 2.71 mm with 100% occlusion reduced to 0% and resumption of brisk TIMI-3 flow.  RECOMMENDATION: The patient was switched to brilinta  from Plavix  in light of presenting with acute coronary syndrome/NSTEMI, he will continue dual antiplatelet therapy for minimum of a year.  Medical therapy for mild to moderate concomitant CAD.  Findings Coronary Findings Diagnostic  Dominance: Right  Left Main The left main coronary artery was angiographically normal and bifurcated into the LAD and  left circumflex vessel  Left Anterior Descending The LAD was a moderate size vessel that had 40% smooth narrowing in the region of the takeoff of the first diagonal vessel.  There was 50% narrowing after the takeoff of the second diagonal vessel.  The LAD extended to  the LV apex.  Left Circumflex The left circumflex vessel was moderate size vessel that gave rise to one proximal marginal branch that had smooth 20% proximal stenosis.  The second marginal branch was totally occluded and was a nubbin shortly after the takeoff of the AV groove circumflex and first marginal branch.  There was very faint distal collateralization of this vessel.  The the distal RCA.  Second Obtuse Marginal Branch Collaterals 2nd Mrg filled by collaterals from 3rd RPL.  Right Coronary Artery The RCA was a dominant vessel that gave rise to the PDA and inferior LV branch and posterolateral coronary artery.  There was very faint collateralization to the distal OM 2 branch which was occluded from the circumflex.  Intervention  2nd Mrg lesion PCI There is no pre-interventional antegrade distal flow (TIMI 0). Pre-stent angioplasty was performed. A drug-eluting stent was placed. The strut is apposed. Post-stent angioplasty was performed. The post-interventional distal flow is normal (TIMI 3). The intervention was successful. No complications occurred at this lesion. Supplies used: BALLOON EMERGE MR 2.5X12; STENT RESOLUTE INTEG 2.5X14; BALLOON [place] EMERGE MR [id number] There is a 0% residual stenosis post intervention.   STRESS TESTS  NM PET CT CARDIAC PERFUSION MULTI W/ABSOLUTE BLOODFLOW 09/30/2023  Narrative   LV perfusion is normal. There is no evidence of ischemia. There is no evidence of infarction.   Rest left ventricular function is normal. Rest EF: 54%. Stress left ventricular function is normal. Stress EF: 67%. End diastolic cavity size is normal. End systolic cavity size is normal.   Myocardial blood flow was computed to be 0.55ml/g/min at rest and 2.27ml/g/min at stress. Global myocardial blood flow reserve was 3.27 and was normal.   Coronary calcium  assessment not performed due to prior revascularization.Stent to OM   The study is normal. The study is low  risk.  EXAM: OVER-READ INTERPRETATION  PET-CT CHEST  The following report is an over-read performed by radiologist Dr. Camellia Lang [facility] on 09/30/2023. This over-read does not include interpretation of cardiac or coronary anatomy or pathology. The cardiac PET and cardiac CT interpretation by the cardiologist is to be attached.  COMPARISON:  None.  FINDINGS: No evidence for lymphadenopathy within the visualized mediastinum or hilar regions.  The visualized lung parenchyma shows no suspicious pulmonary nodule or mass. No focal airspace consolidation. No effusion.  Insert fatty liver  No suspicious lytic or sclerotic osseous abnormality.  IMPRESSION: 1. No acute or significant extracardiac findings. 2. Fatty liver.   Electronically Signed By: Camellia Candle M.D. On: 09/30/2023 08:54   ECHOCARDIOGRAM  ECHOCARDIOGRAM COMPLETE 09/17/2015  Narrative *[facility]* *[facility]* [address] [place], [place] [address] [phone]  ------------------------------------------------------------------- Transthoracic Echocardiography  Patient:    Aidenn, Skellenger MR #:       [id number] Study Date: 09/17/2015 Gender:     M Age:        50 Height:     170.2 cm Weight:     81.4 kg BSA:        1.98 m^2 Pt. Status: Room:       3W32C  ADMITTING    Wilbert Bihari, MD ATTENDING    Midge Golas [id number] SONOGRAPHER  Lauren Lamon, RDCS, CCT PERFORMING   [facility], Inpatient ORDERING     Azeem, Amir  cc:  ------------------------------------------------------------------- LV EF: 50% -   55%  ------------------------------------------------------------------- Indications:      Chest pain 786.51.  ------------------------------------------------------------------- History:   Risk factors:  NSTEMI. Hypertension. Dyslipidemia.  ------------------------------------------------------------------- Study Conclusions  - Left ventricle:  The cavity size was normal. Wall thickness was normal. Systolic function was normal. The estimated ejection fraction was in the range of 50% to 55%. Wall motion was normal; there were no regional wall motion abnormalities. Doppler parameters are consistent with abnormal left ventricular relaxation (grade 1 diastolic dysfunction).  ------------------------------------------------------------------- Study data:  No prior study was available for comparison.  Study status:  Routine.  Procedure:  The patient reported no pain pre or post test. Transthoracic echocardiography. Image quality was adequate.  Study completion:  There were no complications. Transthoracic echocardiography.  M-mode, complete 2D, spectral Doppler, and color Doppler.  Birthdate:  Patient birthdate: 06-Aug-1965.  Age:  Patient is 58 yr old.  Sex:  Gender: male. BMI: 28.1 kg/m^2.  Blood pressure:     122/71  Patient status: Inpatient.  Study date:  Study date: 09/17/2015. Study time: 02:02 PM.  Location:  Bedside.  -------------------------------------------------------------------  ------------------------------------------------------------------- Left ventricle:  The cavity size was normal. Wall thickness was normal. Systolic function was normal. The estimated ejection fraction was in the range of 50% to 55%. Wall motion was normal; there were no regional wall motion abnormalities. Doppler parameters are consistent with abnormal left ventricular relaxation (grade 1 diastolic dysfunction).  ------------------------------------------------------------------- Aortic valve:   Trileaflet; normal thickness leaflets. Mobility was not restricted.  Doppler:  Transvalvular velocity was within the normal range. There was no stenosis. There was no regurgitation.  ------------------------------------------------------------------- Aorta:  Aortic root: The aortic root was normal in  size.  ------------------------------------------------------------------- Mitral valve:   Structurally normal valve.   Mobility was not restricted.  Doppler:  Transvalvular velocity was within the normal range. There was no evidence for stenosis. There was no regurgitation.    Peak gradient (D): 4 mm Hg.  ------------------------------------------------------------------- Left atrium:  The atrium was normal in size.  ------------------------------------------------------------------- Right ventricle:  The cavity size was normal. Wall thickness was normal. Systolic function was normal.  ------------------------------------------------------------------- Pulmonic valve:   Poorly visualized.  Structurally normal valve. Cusp separation was normal.  Doppler:  Transvalvular velocity was within the normal range. There was no evidence for stenosis. There was trivial regurgitation.  ------------------------------------------------------------------- Tricuspid valve:   Structurally normal valve.    Doppler: Transvalvular velocity was within the normal range. There was no regurgitation.  ------------------------------------------------------------------- Pulmonary artery:   The main pulmonary artery was normal-sized. Systolic pressure was within the normal range.  ------------------------------------------------------------------- Right atrium:  The atrium was normal in size.  ------------------------------------------------------------------- Pericardium:  There was no pericardial effusion.  ------------------------------------------------------------------- Systemic veins: Inferior vena cava: The vessel was normal in size. The respirophasic diameter changes were in the normal range (>= 50%), consistent with normal central venous pressure.  ------------------------------------------------------------------- Measurements  Left ventricle                         Value         Reference LV ID, ED, PLAX chordal                44.4  mm     43 - 52 LV ID, ES, PLAX chordal  34.4  mm     23 - 38 LV fx shortening, PLAX chordal (L)     23    %      >=29 LV PW thickness, ED                    7.86  mm     --------- IVS/LV PW ratio, ED                    1.15         <=1.3 LV e&', lateral                         10.6  cm/s   --------- LV E/e&', lateral                       10           --------- LV e&', medial                          9.79  cm/s   --------- LV E/e&', medial                        10.83        --------- LV e&', average                         10.2  cm/s   --------- LV E/e&', average                       10.4         ---------  Ventricular septum                     Value        Reference IVS thickness, ED                      9.06  mm     ---------  LVOT                                   Value        Reference LVOT ID, S                             22    mm     --------- LVOT area                              3.8   cm^2   ---------  Aorta                                  Value        Reference Aortic root ID, ED                     34    mm     ---------  Left atrium                            Value  Reference LA ID, A-P, ES                         32    mm     --------- LA ID/bsa, A-P                         1.62  cm/m^2 <=2.2 LA volume, S                           59    ml     --------- LA volume/bsa, S                       29.8  ml/m^2 --------- LA volume, ES, 1-p A4C                 57.5  ml     --------- LA volume/bsa, ES, 1-p A4C             29    ml/m^2 --------- LA volume, ES, 1-p A2C                 56.1  ml     --------- LA volume/bsa, ES, 1-p A2C             28.3  ml/m^2 ---------  Mitral valve                           Value        Reference Mitral E-wave peak velocity            106   cm/s   --------- Mitral A-wave peak velocity            76.4  cm/s   --------- Mitral deceleration time               169   ms      150 - 230 Mitral peak gradient, D                4     mm Hg  --------- Mitral E/A ratio, peak                 1.4          ---------  Systemic veins                         Value        Reference Estimated CVP                          3     mm Hg  ---------  Right ventricle                        Value        Reference TAPSE                                  22.2  mm     --------- RV s&', lateral, S                      12.4  cm/s   ---------  Legend: (L)  and  (H)  mark values outside  specified reference range.  ------------------------------------------------------------------- Prepared and Electronically Authenticated by  Oneil Parchment, M.D. 2017-07-10T15:01:47          ______________________________________________________________________________________________      Risk Assessment/Calculations:            Physical Exam:   VS:  BP 124/84 (BP Location: Left Arm, Patient Position: Sitting, Cuff Size: Normal)   Pulse 86   Resp 16   Ht 5' 7 (1.702 m)   Wt 195 lb (88.5 kg)   SpO2 96%   BMI 30.54 kg/m    Wt Readings from Last 3 Encounters:  10/30/23 195 lb (88.5 kg)  06/02/23 197 lb 1.6 oz (89.4 kg)  06/27/22 191 lb (86.6 kg)    GEN: Well nourished, well developed in no acute distress NECK: No JVD; No carotid bruits CARDIAC: RRR, no murmurs, rubs, gallops RESPIRATORY:  Clear to auscultation without rales, wheezing or rhonchi  ABDOMEN: Soft, non-tender, non-distended EXTREMITIES:  No edema; No deformity   ASSESSMENT AND PLAN: .    CAD - Stable with no anginal symptoms. No indication for ischemic evaluation.  Low risk cardiac PET 09/2023. GDMT: Aspirin  81 mg, Carvedilol  6.25 mg BID, atorvastatin  80 mg, and nitroglycerin  SL PRN. Heart healthy diet and regular cardiovascular exercise encouraged.    HTN - BP controlled home readings 120-130/80s. Controlled in clinic today. Continue coreg  12.5mg  BID, hydrochlorothiazide  25mg  daily. Discussed to monitor BP at home at  least 2 hours after medications and sitting for 5-10 minutes.   HLD, LDL goal <70 - 10/2023 Total cholesterol 155, triglycerides 168, hdl 40, ldl 86. Plan to continue Atorvastation 80mg  daily, optimize lifestyle changes. Exercises regularly, plans  for dietary changes by reducing carbohydrates and changing to more complex carb (I.e. wheat bread instead of white). Repeat lipid panel, hepatic function panel in December and follow up after.        Dispo: Follow up in 4 months.   Signed, Reche GORMAN Finder, NP

## 2023-10-30 NOTE — Patient Instructions (Signed)
 Medication Instructions:   Your physician recommends that you continue on your current medications as directed. Please refer to the Current Medication list given to you today.   *If you need a refill on your cardiac medications before your next appointment, please call your pharmacy*  Lab Work:  Your physician recommends that you return for a FASTING lipid profile/hepatic, fasting after midnight.  One week prior to your appointment with Caitlin.    If you have labs (blood work) drawn today and your tests are completely normal, you will receive your results only by: MyChart Message (if you have MyChart) OR A paper copy in the mail If you have any lab test that is abnormal or we need to change your treatment, we will call you to review the results.  Testing/Procedures:  None ordered.  Follow-Up: At [facility], you and your health needs are our priority.  As part of our continuing mission to provide you with exceptional heart care, our providers are all part of one team.  This team includes your primary Cardiologist (physician) and Advanced Practice Providers or APPs (Physician Assistants and Nurse Practitioners) who all work together to provide you with the care you need, when you need it.  Your next appointment:   4 month(s)  Provider:   Reche Finder, NP    We recommend signing up for the patient portal called MyChart.  Sign up information is provided on this After Visit Summary.  MyChart is used to connect with patients for Virtual Visits (Telemedicine).  Patients are able to view lab/test results, encounter notes, upcoming appointments, etc.  Non-urgent messages can be sent to your provider as well.   To learn more about what you can do with MyChart, go to [email or web address].

## 2023-11-16 ENCOUNTER — Ambulatory Visit (HOSPITAL_BASED_OUTPATIENT_CLINIC_OR_DEPARTMENT_OTHER): Admitting: Cardiovascular Disease

## 2024-02-10 ENCOUNTER — Ambulatory Visit (HOSPITAL_BASED_OUTPATIENT_CLINIC_OR_DEPARTMENT_OTHER): Payer: Self-pay | Admitting: Family

## 2024-02-10 LAB — HEPATIC FUNCTION PANEL
ALT: 36 IU/L (ref 0–44)
AST: 27 IU/L (ref 0–40)
Albumin: 4.6 g/dL (ref 3.8–4.9)
Alkaline Phosphatase: 103 IU/L (ref 47–123)
Bilirubin Total: 0.6 mg/dL (ref 0.0–1.2)
Bilirubin, Direct: 0.17 mg/dL (ref 0.00–0.40)
Total Protein: 6.8 g/dL (ref 6.0–8.5)

## 2024-02-10 LAB — LIPID PANEL
Chol/HDL Ratio: 4 ratio (ref 0.0–5.0)
Cholesterol, Total: 158 mg/dL (ref 100–199)
HDL: 40 mg/dL (ref >39)
LDL Chol Calc (NIH): 82 mg/dL (ref 0–99)
Triglycerides: 212 mg/dL — ABNORMAL HIGH (ref 0–149)
VLDL Cholesterol Cal: 36 mg/dL (ref 5–40)

## 2024-02-22 ENCOUNTER — Encounter (HOSPITAL_BASED_OUTPATIENT_CLINIC_OR_DEPARTMENT_OTHER): Payer: Self-pay | Admitting: Family

## 2024-02-22 ENCOUNTER — Ambulatory Visit (INDEPENDENT_AMBULATORY_CARE_PROVIDER_SITE_OTHER): Admitting: Family

## 2024-02-22 VITALS — BP 112/80 | HR 66 | Ht 67.0 in | Wt 200.0 lb

## 2024-02-22 DIAGNOSIS — I10 Essential (primary) hypertension: Secondary | ICD-10-CM | POA: Diagnosis not present

## 2024-02-22 DIAGNOSIS — E785 Hyperlipidemia, unspecified: Secondary | ICD-10-CM | POA: Diagnosis not present

## 2024-02-22 DIAGNOSIS — I25118 Atherosclerotic heart disease of native coronary artery with other forms of angina pectoris: Secondary | ICD-10-CM

## 2024-02-22 NOTE — Progress Notes (Signed)
 Cardiology Office Note:  .   Date:  02/22/2024  ID:  [name], DOB [birth year], MRN [id number] PCP: Maree Isles, MD  [facility] Providers Cardiologist:  Annabella Scarce, MD    History of Present Illness: [name] is a 58 y.o. male  with a hx of CAD s/p DES-OM2 09/2015, HLD, HTN.    Admitted 09/2015 with NSTEMI found to have occluded OM2 treated with DES with residual 50% LAD, 20% LCx lesion. LVEF 50-55%. Metoprolol  later changed to carvedilol  due to bradycardia, dizziness. Later changed to Nebivolol due to fatigue and beta blocker eventually discontinued. Metoprolol  later resumed. Myoview  05/2016 no ischemia, LVEF 55%. Unable to tolerate Imdur  due to headache. Atorvastatin  reduced due to myalgia. HCTZ later added for BP control.    Seen 04/12/21 with BP slightly above goal, HCTZ increased to 25mg  QD. TSH, testosterone levels checked due to fatigue and encouraged to establish with PCP. At visit 06/27/22 BP was at goal but LDL elevated, Atorvastatin  increased to 80mg  daily.    At visit 06/02/23 Metoprolol  stopped and transitioned to Coreg  6.25mg  BID for BP control. Due to chest pain, cardiac PET 09/30/23 no ischemia/infarction, rest EF 54% and stress EF 67%.   At visit 10/2023 BP was at goal. 10/2023 Total cholesterol 155, triglycerides 168, hdl 40, ldl 86. He wished to work on dietary changes.   Labs 02/09/24 TC 158, trig 212, HDL 40, LDL 82.   Presents today for follow up. Presently working 30h per week, planning to retire soon. Reports no shortness of breath nor dyspnea on exertion. Reports no chest pain, pressure, or tightness. No edema, orthopnea, PND. Reports no palpitations.  Active working out 2-3x per week including walking 2-3 miles. Wil lhave sporadic sharp chest pain when sitting still that last 30 seconds. No chest pain with exercise. Made dietary changes including switch to whole wheat bread, pasta, reducing intake of biscuits.   Labs with PCP 10/14/23: TSH  1.61 Total cholesterol 155, triglycerides 168, hdl 40, ldl 86 Creatinine 8.98, gfr 85, Na 144, K 3.8, Ca 9.6, AST 34, ALT 45, alk phos 97 Hb 15.1, Hct 45.5  ROS: Please see the history of present illness.    All other systems reviewed and are negative.   Studies Reviewed: .           Risk Assessment/Calculations:            Physical Exam:   VS:  BP 112/80 (BP Location: Right Arm, Patient Position: Sitting, Cuff Size: Normal)   Pulse 66   Ht 5' 7 (1.702 m)   Wt 200 lb (90.7 kg)   SpO2 97%   BMI 31.32 kg/m    Wt Readings from Last 3 Encounters:  02/22/24 200 lb (90.7 kg)  10/30/23 195 lb (88.5 kg)  06/02/23 197 lb 1.6 oz (89.4 kg)    GEN: Well nourished, well developed in no acute distress NECK: No JVD; No carotid bruits CARDIAC: RRR, no murmurs, rubs, gallops RESPIRATORY:  Clear to auscultation without rales, wheezing or rhonchi  ABDOMEN: Soft, non-tender, non-distended EXTREMITIES:  No edema; No deformity   ASSESSMENT AND PLAN: .    CAD - Stable with no anginal symptoms. No indication for ischemic evaluation.  Low risk cardiac PET 09/2023. GDMT: Aspirin  81 mg, Carvedilol  6.25 mg BID, atorvastatin  80 mg, and nitroglycerin  SL PRN. Heart healthy diet and regular cardiovascular exercise encouraged.    HTN - BP at goal <130/80. Continue coreg  12.5mg  BID, hydrochlorothiazide   25mg  daily. Discussed to monitor BP at home at least 2 hours after medications and sitting for 5-10 minutes. Difficulties with ED, PCP has prescribed Sildenafil. Discussed could trial reducing Coreg  dose, as BP controlled he prefers to defer today.    HLD, LDL goal <70 - 10/2023 Total cholesterol 155, triglycerides 168, hdl 40, ldl 86 ? 02/09/24 TC 158, trig 212, HDL 40, LDL 82. Notes some dietary indiscretion at Thanksgiving. Reports continuing to exercise 3x per week. He has made to whole wheat products. Given significant dietary changes, some concern for lab error. Repeat FLP and Lp(a) in 4-6 weeks to  reassess. If LDL not at goal, plan to add Zetia 10mg  daily. Continue Atorvastatin  80mg  daily. Will avoid branded Rx at this time as plans to retire soon and will be self pay for his medications.        Dispo: Follow up in 6 months.   Signed, Reche GORMAN Finder, NP

## 2024-02-22 NOTE — Patient Instructions (Addendum)
 Medication Instructions:   If Sildenafil is expensive at the pharmacy, go online to [email or web address]   *If you need a refill on your cardiac medications before your next appointment, please call your pharmacy*  Labs Your physician recommends that you return for lab work in 4-6 weeks for fasting cholesterol panel and lipoprotein a  If cholesterol is still high we will plan to add Zetia (ezetimibe) 10mg  daily  Follow-Up: At [facility], you and your health needs are our priority.  As part of our continuing mission to provide you with exceptional heart care, our providers are all part of one team.  This team includes your primary Cardiologist (physician) and Advanced Practice Providers or APPs (Physician Assistants and Nurse Practitioners) who all work together to provide you with the care you need, when you need it.  Your next appointment:   6 month(s)  Provider:   Annabella Scarce, MD, Rosaline Bane, NP, or Reche Finder, NP    We recommend signing up for the patient portal called MyChart.  Sign up information is provided on this After Visit Summary.  MyChart is used to connect with patients for Virtual Visits (Telemedicine).  Patients are able to view lab/test results, encounter notes, upcoming appointments, etc.  Non-urgent messages can be sent to your provider as well.   To learn more about what you can do with MyChart, go to [email or web address].

## 2024-04-11 LAB — LIPOPROTEIN A (LPA): Lipoprotein (a): 123.8 nmol/L — ABNORMAL HIGH

## 2024-04-11 LAB — LIPID PANEL
Chol/HDL Ratio: 3.6 ratio (ref 0.0–5.0)
Cholesterol, Total: 147 mg/dL (ref 100–199)
HDL: 41 mg/dL
LDL Chol Calc (NIH): 78 mg/dL (ref 0–99)
Triglycerides: 163 mg/dL — ABNORMAL HIGH (ref 0–149)
VLDL Cholesterol Cal: 28 mg/dL (ref 5–40)

## 2024-04-12 ENCOUNTER — Ambulatory Visit (HOSPITAL_BASED_OUTPATIENT_CLINIC_OR_DEPARTMENT_OTHER): Payer: Self-pay | Admitting: Family

## 2024-04-12 DIAGNOSIS — I25118 Atherosclerotic heart disease of native coronary artery with other forms of angina pectoris: Secondary | ICD-10-CM

## 2024-04-12 DIAGNOSIS — E785 Hyperlipidemia, unspecified: Secondary | ICD-10-CM

## 2024-04-12 DIAGNOSIS — Z79899 Other long term (current) drug therapy: Secondary | ICD-10-CM

## 2024-04-12 MED ORDER — EZETIMIBE 10 MG PO TABS: 10.0000 mg | ORAL_TABLET | Freq: Every day | ORAL | 2 refills | Status: AC

## 2024-04-12 NOTE — Telephone Encounter (Signed)
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 LDL (bad cholesterol)  improved but not yet at goal <70. Recommend continue Atorvastatin  80mg  daily and  start Zetia  10mg  daily. Repeat FLP in 3 months.

## 2024-04-12 NOTE — Telephone Encounter (Signed)
 Results and plan sent to the pts active mychart account to review by Reche Finder, NP.  Pt did view this in mychart.   Will send in zetia  10 mg po daily to pts pharmacy on file as well as order for repeat lipids in 3 months.  Will endorse to the pt that zetia  was sent to his pharmacy and to mark down on his calendar to come in for repeat fasting lipids in 3 months (around 07/10/24) via mychart.  Will monitor for review.
